# Patient Record
Sex: Male | Born: 1935 | Race: White | Hispanic: No | Marital: Married | Smoking: Light tobacco smoker
Health system: Southern US, Community
[De-identification: ages and names within clinical notes are randomized; demographics above are authoritative.]

## PROBLEM LIST (undated history)

## (undated) DIAGNOSIS — K219 Gastro-esophageal reflux disease without esophagitis: Secondary | ICD-10-CM

## (undated) DIAGNOSIS — C4490 Unspecified malignant neoplasm of skin, unspecified: Secondary | ICD-10-CM

## (undated) DIAGNOSIS — N400 Enlarged prostate without lower urinary tract symptoms: Secondary | ICD-10-CM

## (undated) DIAGNOSIS — I10 Essential (primary) hypertension: Secondary | ICD-10-CM

## (undated) DIAGNOSIS — Z860100 Personal history of colon polyps, unspecified: Secondary | ICD-10-CM

## (undated) DIAGNOSIS — R319 Hematuria, unspecified: Secondary | ICD-10-CM

## (undated) DIAGNOSIS — Z87442 Personal history of urinary calculi: Secondary | ICD-10-CM

## (undated) DIAGNOSIS — R339 Retention of urine, unspecified: Secondary | ICD-10-CM

## (undated) DIAGNOSIS — I700 Atherosclerosis of aorta: Secondary | ICD-10-CM

## (undated) DIAGNOSIS — I6529 Occlusion and stenosis of unspecified carotid artery: Secondary | ICD-10-CM

## (undated) DIAGNOSIS — K117 Disturbances of salivary secretion: Secondary | ICD-10-CM

## (undated) DIAGNOSIS — H6690 Otitis media, unspecified, unspecified ear: Secondary | ICD-10-CM

## (undated) DIAGNOSIS — F419 Anxiety disorder, unspecified: Secondary | ICD-10-CM

## (undated) DIAGNOSIS — E785 Hyperlipidemia, unspecified: Secondary | ICD-10-CM

## (undated) HISTORY — DX: Benign prostatic hyperplasia without lower urinary tract symptoms: N40.0

## (undated) HISTORY — DX: Essential (primary) hypertension: I10

## (undated) HISTORY — DX: Hyperlipidemia, unspecified: E78.5

## (undated) HISTORY — PX: TONSILLECTOMY: SUR1361

## (undated) HISTORY — DX: Occlusion and stenosis of unspecified carotid artery: I65.29

## (undated) HISTORY — PX: COLONOSCOPY: SHX174

## (undated) HISTORY — DX: Personal history of colon polyps, unspecified: Z86.0100

## (undated) HISTORY — DX: Personal history of colonic polyps: Z86.010

## (undated) HISTORY — DX: Otitis media, unspecified, unspecified ear: H66.90

## (undated) HISTORY — PX: TRANSURETHRAL RESECTION OF PROSTATE: SHX73

---

## 2000-05-07 ENCOUNTER — Ambulatory Visit (HOSPITAL_BASED_OUTPATIENT_CLINIC_OR_DEPARTMENT_OTHER): Admission: RE | Admit: 2000-05-07 | Discharge: 2000-05-07 | Payer: Self-pay | Admitting: Surgery

## 2000-05-07 ENCOUNTER — Encounter (INDEPENDENT_AMBULATORY_CARE_PROVIDER_SITE_OTHER): Payer: Self-pay | Admitting: *Deleted

## 2002-05-06 ENCOUNTER — Emergency Department (HOSPITAL_COMMUNITY): Admission: EM | Admit: 2002-05-06 | Discharge: 2002-05-07 | Payer: Self-pay | Admitting: Emergency Medicine

## 2002-05-07 ENCOUNTER — Encounter: Payer: Self-pay | Admitting: Emergency Medicine

## 2004-02-17 ENCOUNTER — Ambulatory Visit: Payer: Self-pay | Admitting: Family Medicine

## 2004-06-02 ENCOUNTER — Ambulatory Visit: Payer: Self-pay | Admitting: Family Medicine

## 2004-07-06 ENCOUNTER — Ambulatory Visit: Payer: Self-pay | Admitting: Gastroenterology

## 2004-07-12 ENCOUNTER — Ambulatory Visit: Payer: Self-pay | Admitting: Family Medicine

## 2004-07-26 ENCOUNTER — Ambulatory Visit: Payer: Self-pay | Admitting: Family Medicine

## 2004-08-04 ENCOUNTER — Ambulatory Visit: Payer: Self-pay | Admitting: Gastroenterology

## 2004-08-25 ENCOUNTER — Ambulatory Visit: Payer: Self-pay | Admitting: Gastroenterology

## 2004-12-02 ENCOUNTER — Ambulatory Visit: Payer: Self-pay | Admitting: Internal Medicine

## 2005-07-03 ENCOUNTER — Ambulatory Visit: Payer: Self-pay | Admitting: Family Medicine

## 2005-07-18 ENCOUNTER — Ambulatory Visit: Payer: Self-pay | Admitting: Family Medicine

## 2005-11-16 ENCOUNTER — Ambulatory Visit: Payer: Self-pay | Admitting: Family Medicine

## 2005-12-20 ENCOUNTER — Ambulatory Visit: Payer: Self-pay | Admitting: Family Medicine

## 2006-04-24 ENCOUNTER — Ambulatory Visit: Payer: Self-pay | Admitting: Vascular Surgery

## 2006-07-27 DIAGNOSIS — I10 Essential (primary) hypertension: Secondary | ICD-10-CM | POA: Insufficient documentation

## 2006-07-27 DIAGNOSIS — R319 Hematuria, unspecified: Secondary | ICD-10-CM | POA: Insufficient documentation

## 2006-07-27 DIAGNOSIS — Z860100 Personal history of colon polyps, unspecified: Secondary | ICD-10-CM | POA: Insufficient documentation

## 2006-07-27 DIAGNOSIS — M810 Age-related osteoporosis without current pathological fracture: Secondary | ICD-10-CM | POA: Insufficient documentation

## 2006-07-27 DIAGNOSIS — I6529 Occlusion and stenosis of unspecified carotid artery: Secondary | ICD-10-CM | POA: Insufficient documentation

## 2006-08-01 ENCOUNTER — Ambulatory Visit: Payer: Self-pay | Admitting: Family Medicine

## 2006-08-01 DIAGNOSIS — F528 Other sexual dysfunction not due to a substance or known physiological condition: Secondary | ICD-10-CM

## 2006-08-01 DIAGNOSIS — N401 Enlarged prostate with lower urinary tract symptoms: Secondary | ICD-10-CM

## 2006-08-01 DIAGNOSIS — N138 Other obstructive and reflux uropathy: Secondary | ICD-10-CM

## 2006-08-01 DIAGNOSIS — E785 Hyperlipidemia, unspecified: Secondary | ICD-10-CM

## 2006-08-08 ENCOUNTER — Ambulatory Visit: Payer: Self-pay | Admitting: Family Medicine

## 2006-08-10 ENCOUNTER — Ambulatory Visit: Payer: Self-pay | Admitting: Internal Medicine

## 2006-08-10 ENCOUNTER — Encounter: Payer: Self-pay | Admitting: Family Medicine

## 2006-09-27 ENCOUNTER — Ambulatory Visit: Payer: Self-pay | Admitting: Family Medicine

## 2006-09-28 ENCOUNTER — Encounter: Payer: Self-pay | Admitting: Family Medicine

## 2006-10-02 DIAGNOSIS — E291 Testicular hypofunction: Secondary | ICD-10-CM

## 2006-10-02 LAB — CONVERTED CEMR LAB: Testosterone: 268.52 ng/dL — ABNORMAL LOW (ref 350.00–890)

## 2006-10-03 ENCOUNTER — Telehealth (INDEPENDENT_AMBULATORY_CARE_PROVIDER_SITE_OTHER): Payer: Self-pay | Admitting: *Deleted

## 2006-10-23 ENCOUNTER — Encounter: Payer: Self-pay | Admitting: Family Medicine

## 2006-11-16 ENCOUNTER — Encounter: Payer: Self-pay | Admitting: Family Medicine

## 2006-12-25 ENCOUNTER — Ambulatory Visit: Payer: Self-pay | Admitting: Family Medicine

## 2007-01-01 ENCOUNTER — Telehealth (INDEPENDENT_AMBULATORY_CARE_PROVIDER_SITE_OTHER): Payer: Self-pay | Admitting: *Deleted

## 2007-01-02 ENCOUNTER — Ambulatory Visit: Payer: Self-pay | Admitting: Family Medicine

## 2007-01-02 DIAGNOSIS — S83419A Sprain of medial collateral ligament of unspecified knee, initial encounter: Secondary | ICD-10-CM | POA: Insufficient documentation

## 2007-05-09 ENCOUNTER — Encounter: Payer: Self-pay | Admitting: Family Medicine

## 2007-05-16 ENCOUNTER — Telehealth (INDEPENDENT_AMBULATORY_CARE_PROVIDER_SITE_OTHER): Payer: Self-pay | Admitting: *Deleted

## 2007-05-16 ENCOUNTER — Ambulatory Visit: Payer: Self-pay | Admitting: Family Medicine

## 2007-05-16 LAB — CONVERTED CEMR LAB
ALT: 20 units/L (ref 0–53)
AST: 23 units/L (ref 0–37)
Albumin: 3.6 g/dL (ref 3.5–5.2)
Alkaline Phosphatase: 80 units/L (ref 39–117)
Bilirubin, Direct: 0.1 mg/dL (ref 0.0–0.3)
Total Bilirubin: 0.7 mg/dL (ref 0.3–1.2)
Total Protein: 6.9 g/dL (ref 6.0–8.3)

## 2007-05-17 ENCOUNTER — Encounter (INDEPENDENT_AMBULATORY_CARE_PROVIDER_SITE_OTHER): Payer: Self-pay | Admitting: *Deleted

## 2007-05-20 ENCOUNTER — Encounter: Payer: Self-pay | Admitting: Family Medicine

## 2007-05-27 ENCOUNTER — Telehealth (INDEPENDENT_AMBULATORY_CARE_PROVIDER_SITE_OTHER): Payer: Self-pay | Admitting: *Deleted

## 2007-07-09 ENCOUNTER — Ambulatory Visit: Payer: Self-pay | Admitting: Vascular Surgery

## 2007-07-30 ENCOUNTER — Telehealth (INDEPENDENT_AMBULATORY_CARE_PROVIDER_SITE_OTHER): Payer: Self-pay | Admitting: *Deleted

## 2007-07-30 ENCOUNTER — Encounter (INDEPENDENT_AMBULATORY_CARE_PROVIDER_SITE_OTHER): Payer: Self-pay | Admitting: *Deleted

## 2007-08-21 ENCOUNTER — Telehealth (INDEPENDENT_AMBULATORY_CARE_PROVIDER_SITE_OTHER): Payer: Self-pay | Admitting: *Deleted

## 2007-08-27 ENCOUNTER — Ambulatory Visit: Payer: Self-pay | Admitting: Family Medicine

## 2007-08-28 ENCOUNTER — Encounter (INDEPENDENT_AMBULATORY_CARE_PROVIDER_SITE_OTHER): Payer: Self-pay | Admitting: *Deleted

## 2007-09-05 ENCOUNTER — Encounter (INDEPENDENT_AMBULATORY_CARE_PROVIDER_SITE_OTHER): Payer: Self-pay | Admitting: *Deleted

## 2007-09-17 ENCOUNTER — Telehealth: Payer: Self-pay | Admitting: Family Medicine

## 2007-09-17 ENCOUNTER — Ambulatory Visit: Payer: Self-pay | Admitting: Family Medicine

## 2008-01-20 ENCOUNTER — Ambulatory Visit: Payer: Self-pay | Admitting: Vascular Surgery

## 2008-03-17 ENCOUNTER — Ambulatory Visit: Payer: Self-pay | Admitting: Family Medicine

## 2008-03-18 ENCOUNTER — Encounter: Payer: Self-pay | Admitting: Family Medicine

## 2008-03-23 ENCOUNTER — Encounter (INDEPENDENT_AMBULATORY_CARE_PROVIDER_SITE_OTHER): Payer: Self-pay | Admitting: *Deleted

## 2008-03-31 ENCOUNTER — Encounter (INDEPENDENT_AMBULATORY_CARE_PROVIDER_SITE_OTHER): Payer: Self-pay | Admitting: *Deleted

## 2008-04-23 ENCOUNTER — Ambulatory Visit: Payer: Self-pay | Admitting: Family Medicine

## 2008-05-08 ENCOUNTER — Ambulatory Visit: Payer: Self-pay | Admitting: Family Medicine

## 2008-05-08 ENCOUNTER — Ambulatory Visit (HOSPITAL_BASED_OUTPATIENT_CLINIC_OR_DEPARTMENT_OTHER): Admission: RE | Admit: 2008-05-08 | Discharge: 2008-05-08 | Payer: Self-pay | Admitting: Family Medicine

## 2008-05-08 ENCOUNTER — Ambulatory Visit: Payer: Self-pay | Admitting: Diagnostic Radiology

## 2008-05-08 DIAGNOSIS — M549 Dorsalgia, unspecified: Secondary | ICD-10-CM | POA: Insufficient documentation

## 2008-05-10 DIAGNOSIS — M949 Disorder of cartilage, unspecified: Secondary | ICD-10-CM

## 2008-05-10 DIAGNOSIS — M899 Disorder of bone, unspecified: Secondary | ICD-10-CM | POA: Insufficient documentation

## 2008-05-11 ENCOUNTER — Ambulatory Visit: Payer: Self-pay | Admitting: Family Medicine

## 2008-05-11 ENCOUNTER — Telehealth (INDEPENDENT_AMBULATORY_CARE_PROVIDER_SITE_OTHER): Payer: Self-pay | Admitting: *Deleted

## 2008-05-15 ENCOUNTER — Encounter (INDEPENDENT_AMBULATORY_CARE_PROVIDER_SITE_OTHER): Payer: Self-pay | Admitting: *Deleted

## 2008-05-15 LAB — CONVERTED CEMR LAB: Vit D, 25-Hydroxy: 38 ng/mL (ref 30–89)

## 2008-07-21 ENCOUNTER — Ambulatory Visit: Payer: Self-pay | Admitting: Vascular Surgery

## 2008-08-26 ENCOUNTER — Encounter: Payer: Self-pay | Admitting: Family Medicine

## 2008-08-26 ENCOUNTER — Ambulatory Visit: Payer: Self-pay | Admitting: Internal Medicine

## 2008-09-14 ENCOUNTER — Encounter (INDEPENDENT_AMBULATORY_CARE_PROVIDER_SITE_OTHER): Payer: Self-pay | Admitting: *Deleted

## 2008-09-23 ENCOUNTER — Ambulatory Visit: Payer: Self-pay | Admitting: Family Medicine

## 2008-09-23 ENCOUNTER — Encounter (INDEPENDENT_AMBULATORY_CARE_PROVIDER_SITE_OTHER): Payer: Self-pay | Admitting: *Deleted

## 2008-09-23 DIAGNOSIS — F17200 Nicotine dependence, unspecified, uncomplicated: Secondary | ICD-10-CM | POA: Insufficient documentation

## 2008-09-25 ENCOUNTER — Encounter (INDEPENDENT_AMBULATORY_CARE_PROVIDER_SITE_OTHER): Payer: Self-pay | Admitting: *Deleted

## 2008-10-05 ENCOUNTER — Ambulatory Visit: Payer: Self-pay | Admitting: Internal Medicine

## 2008-10-08 ENCOUNTER — Encounter: Payer: Self-pay | Admitting: Family Medicine

## 2008-10-15 ENCOUNTER — Telehealth (INDEPENDENT_AMBULATORY_CARE_PROVIDER_SITE_OTHER): Payer: Self-pay | Admitting: *Deleted

## 2008-10-28 ENCOUNTER — Telehealth (INDEPENDENT_AMBULATORY_CARE_PROVIDER_SITE_OTHER): Payer: Self-pay | Admitting: *Deleted

## 2008-12-14 ENCOUNTER — Telehealth (INDEPENDENT_AMBULATORY_CARE_PROVIDER_SITE_OTHER): Payer: Self-pay | Admitting: *Deleted

## 2008-12-15 ENCOUNTER — Ambulatory Visit: Payer: Self-pay | Admitting: Family Medicine

## 2008-12-15 LAB — CONVERTED CEMR LAB
OCCULT 1: NEGATIVE
OCCULT 3: NEGATIVE

## 2009-01-12 ENCOUNTER — Ambulatory Visit: Payer: Self-pay | Admitting: Vascular Surgery

## 2009-02-27 ENCOUNTER — Telehealth: Payer: Self-pay | Admitting: Family Medicine

## 2009-04-26 ENCOUNTER — Telehealth (INDEPENDENT_AMBULATORY_CARE_PROVIDER_SITE_OTHER): Payer: Self-pay | Admitting: *Deleted

## 2009-04-26 ENCOUNTER — Ambulatory Visit: Payer: Self-pay | Admitting: Family Medicine

## 2009-04-28 LAB — CONVERTED CEMR LAB
Bilirubin, Direct: 0 mg/dL (ref 0.0–0.3)
Cholesterol: 155 mg/dL (ref 0–200)
LDL Cholesterol: 65 mg/dL (ref 0–99)
Total Bilirubin: 0.4 mg/dL (ref 0.3–1.2)
Total CHOL/HDL Ratio: 2
Triglycerides: 84 mg/dL (ref 0.0–149.0)
VLDL: 16.8 mg/dL (ref 0.0–40.0)

## 2009-06-07 ENCOUNTER — Encounter: Payer: Self-pay | Admitting: Family Medicine

## 2009-07-27 ENCOUNTER — Ambulatory Visit: Payer: Self-pay | Admitting: Vascular Surgery

## 2009-07-27 ENCOUNTER — Encounter: Payer: Self-pay | Admitting: Family Medicine

## 2009-09-24 ENCOUNTER — Encounter (INDEPENDENT_AMBULATORY_CARE_PROVIDER_SITE_OTHER): Payer: Self-pay | Admitting: *Deleted

## 2009-09-24 ENCOUNTER — Telehealth (INDEPENDENT_AMBULATORY_CARE_PROVIDER_SITE_OTHER): Payer: Self-pay | Admitting: *Deleted

## 2009-09-24 ENCOUNTER — Ambulatory Visit: Payer: Self-pay | Admitting: Family Medicine

## 2009-09-28 ENCOUNTER — Ambulatory Visit: Payer: Self-pay | Admitting: Family Medicine

## 2009-09-28 ENCOUNTER — Telehealth: Payer: Self-pay | Admitting: Family Medicine

## 2009-09-28 DIAGNOSIS — N390 Urinary tract infection, site not specified: Secondary | ICD-10-CM | POA: Insufficient documentation

## 2009-09-28 LAB — CONVERTED CEMR LAB
AST: 22 units/L (ref 0–37)
BUN: 9 mg/dL (ref 6–23)
Basophils Absolute: 0 10*3/uL (ref 0.0–0.1)
Basophils Relative: 0.5 % (ref 0.0–3.0)
Bilirubin, Direct: 0.1 mg/dL (ref 0.0–0.3)
Cholesterol: 137 mg/dL (ref 0–200)
Eosinophils Absolute: 0.2 10*3/uL (ref 0.0–0.7)
GFR calc non Af Amer: 92.28 mL/min (ref >60)
LDL Cholesterol: 70 mg/dL (ref 0–99)
MCHC: 33.7 g/dL (ref 30.0–36.0)
MCV: 99.8 fL (ref 78.0–100.0)
Monocytes Absolute: 0.6 10*3/uL (ref 0.1–1.0)
Neutrophils Relative %: 67.6 % (ref 43.0–77.0)
Nitrite: NEGATIVE
Platelets: 225 10*3/uL (ref 150.0–400.0)
Potassium: 4.5 meq/L (ref 3.5–5.1)
RDW: 14.6 % (ref 11.5–14.6)
Sodium: 142 meq/L (ref 135–145)
Specific Gravity, Urine: <1.005
Total Bilirubin: 0.6 mg/dL (ref 0.3–1.2)
Triglycerides: 66 mg/dL (ref 0.0–149.0)
Urobilinogen, UA: 0.2
VLDL: 13.2 mg/dL (ref 0.0–40.0)

## 2009-09-29 LAB — CONVERTED CEMR LAB: Fecal Occult Bld: NEGATIVE

## 2009-09-30 ENCOUNTER — Ambulatory Visit: Payer: Self-pay | Admitting: Family Medicine

## 2009-09-30 LAB — CONVERTED CEMR LAB
Testosterone-% Free: 1.5 % — ABNORMAL LOW (ref 1.6–2.9)
Vit D, 25-Hydroxy: 41 ng/mL (ref 30–89)

## 2009-10-11 ENCOUNTER — Ambulatory Visit (HOSPITAL_BASED_OUTPATIENT_CLINIC_OR_DEPARTMENT_OTHER): Admission: RE | Admit: 2009-10-11 | Discharge: 2009-10-11 | Payer: Self-pay | Admitting: Family Medicine

## 2009-10-11 ENCOUNTER — Ambulatory Visit: Payer: Self-pay | Admitting: Family Medicine

## 2009-10-11 ENCOUNTER — Ambulatory Visit: Payer: Self-pay | Admitting: Diagnostic Radiology

## 2009-10-11 DIAGNOSIS — R079 Chest pain, unspecified: Secondary | ICD-10-CM | POA: Insufficient documentation

## 2009-11-04 ENCOUNTER — Encounter: Payer: Self-pay | Admitting: Family Medicine

## 2009-11-16 ENCOUNTER — Encounter (INDEPENDENT_AMBULATORY_CARE_PROVIDER_SITE_OTHER): Payer: Self-pay | Admitting: *Deleted

## 2010-03-27 LAB — CONVERTED CEMR LAB
ALT: 18 units/L (ref 0–40)
ALT: 20 units/L (ref 0–53)
AST: 25 units/L (ref 0–37)
AST: 27 units/L (ref 0–37)
AST: 29 units/L (ref 0–37)
Albumin: 3.5 g/dL (ref 3.5–5.2)
Albumin: 3.8 g/dL (ref 3.5–5.2)
Alkaline Phosphatase: 64 units/L (ref 39–117)
Alkaline Phosphatase: 68 units/L (ref 39–117)
Alkaline Phosphatase: 74 units/L (ref 39–117)
Alkaline Phosphatase: 77 units/L (ref 39–117)
BUN: 9 mg/dL (ref 6–23)
BUN: 9 mg/dL (ref 6–23)
Basophils Absolute: 0 10*3/uL (ref 0.0–0.1)
Basophils Absolute: 0 10*3/uL (ref 0.0–0.1)
Basophils Relative: 0.7 % (ref 0.0–1.0)
Bilirubin, Direct: 0.1 mg/dL (ref 0.0–0.3)
Bilirubin, Direct: 0.1 mg/dL (ref 0.0–0.3)
Bilirubin, Direct: 0.1 mg/dL (ref 0.0–0.3)
Blood in Urine, dipstick: NEGATIVE
CO2: 29 meq/L (ref 19–32)
CO2: 31 meq/L (ref 19–32)
Calcium: 9 mg/dL (ref 8.4–10.5)
Calcium: 9.3 mg/dL (ref 8.4–10.5)
Chloride: 105 meq/L (ref 96–112)
Chloride: 108 meq/L (ref 96–112)
Creatinine, Ser: 0.8 mg/dL (ref 0.4–1.5)
Creatinine, Ser: 0.8 mg/dL (ref 0.4–1.5)
Eosinophils Absolute: 0.1 10*3/uL (ref 0.0–0.6)
Eosinophils Absolute: 0.1 10*3/uL (ref 0.0–0.7)
Eosinophils Relative: 1.6 % (ref 0.0–5.0)
GFR calc Af Amer: 122 mL/min
GFR calc Af Amer: 123 mL/min
GFR calc non Af Amer: 101 mL/min
GFR calc non Af Amer: 101 mL/min
Glucose, Bld: 80 mg/dL (ref 70–99)
Glucose, Bld: 88 mg/dL (ref 70–99)
Glucose, Bld: 97 mg/dL (ref 70–99)
Glucose, Urine, Semiquant: NEGATIVE
HCT: 40.2 % (ref 39.0–52.0)
HCT: 40.4 % (ref 39.0–52.0)
HDL: 61.8 mg/dL (ref >39.00)
Hemoglobin: 13.8 g/dL (ref 13.0–17.0)
Hemoglobin: 13.9 g/dL (ref 13.0–17.0)
Ketones, urine, test strip: NEGATIVE
Lymphocytes Relative: 22.2 % (ref 12.0–46.0)
Lymphocytes Relative: 24.4 % (ref 12.0–46.0)
MCHC: 34.1 g/dL (ref 30.0–36.0)
MCHC: 34.5 g/dL (ref 30.0–36.0)
MCV: 98.7 fL (ref 78.0–100.0)
Monocytes Absolute: 0.5 10*3/uL (ref 0.1–1.0)
Monocytes Absolute: 0.6 10*3/uL (ref 0.2–0.7)
Monocytes Relative: 7 % (ref 3.0–12.0)
Monocytes Relative: 8.2 % (ref 3.0–11.0)
Neutro Abs: 4.5 10*3/uL (ref 1.4–7.7)
Neutro Abs: 4.7 10*3/uL (ref 1.4–7.7)
Neutrophils Relative %: 64.8 % (ref 43.0–77.0)
Neutrophils Relative %: 66.7 % (ref 43.0–77.0)
Neutrophils Relative %: 67.3 % (ref 43.0–77.0)
PSA: 2.47 ng/mL (ref 0.10–4.00)
PSA: 2.52 ng/mL (ref 0.10–4.00)
Platelets: 237 10*3/uL (ref 150–400)
Platelets: 243 10*3/uL (ref 150–400)
Potassium: 4 meq/L (ref 3.5–5.1)
Potassium: 4.1 meq/L (ref 3.5–5.1)
RBC: 4.09 M/uL — ABNORMAL LOW (ref 4.22–5.81)
RDW: 12.9 % (ref 11.5–14.6)
RDW: 13.4 % (ref 11.5–14.6)
RDW: 13.7 % (ref 11.5–14.6)
Sodium: 141 meq/L (ref 135–145)
Sodium: 142 meq/L (ref 135–145)
Specific Gravity, Urine: <1.005
TSH: 0.39 microintl units/mL (ref 0.35–5.50)
Testosterone-% Free: 1.7 % (ref 1.6–2.9)
Testosterone: 390.75 ng/dL (ref 350–890)
Total Bilirubin: 0.7 mg/dL (ref 0.3–1.2)
Total Bilirubin: 0.8 mg/dL (ref 0.3–1.2)
Total Protein: 6.3 g/dL (ref 6.0–8.3)
Total Protein: 6.4 g/dL (ref 6.0–8.3)
Total Protein: 6.7 g/dL (ref 6.0–8.3)
Triglycerides: 71 mg/dL (ref 0.0–149.0)
WBC: 7 10*3/uL (ref 4.5–10.5)
pH: 7.5

## 2010-03-31 NOTE — Progress Notes (Signed)
Summary: LABS  Phone Note Call from Patient Call back at [facility] Phone [phone]   Caller: Patient Summary of Call: PT WANT TO KNOW WILL HE BE COMING IN EVERY 6 MONTH FOR LABS.HE HAD LABS TODAY. Initial call taken by: Freddy Jaksch,  September 28, 2009 8:39 AM  Follow-up for Phone Call        ok Follow-up by: Loreen Freud DO,  September 28, 2009 11:40 AM

## 2010-03-31 NOTE — Letter (Signed)
Summary: [facility]  [facility]   Imported By: Lanelle Bal 08/14/2009 11:14:49  _____________________________________________________________________  External Attachment:    Type:   Image     Comment:   External Document

## 2010-03-31 NOTE — Consult Note (Signed)
Summary: [facility]  [facility]   Imported By: Lanelle Bal 11/16/2009 09:35:21  _____________________________________________________________________  External Attachment:    Type:   Image     Comment:   External Document

## 2010-03-31 NOTE — Miscellaneous (Signed)
Summary: Orders Update  Clinical Lists Changes  Medications: Rx of PLAVIX 75 MG TABS (CLOPIDOGREL BISULFATE) qd;  #90 x 3;  Signed;  Entered by: Loreen Freud DO;  Authorized by: Loreen Freud DO;  Method used: Electronically to [facility]*, , ,   , Ph: [phone], Fax: [phone] Rx of FOSAMAX 70 MG TABS (ALENDRONATE SODIUM) 1 by mouth q week;  #12 x 3;  Signed;  Entered by: Loreen Freud DO;  Authorized by: Loreen Freud DO;  Method used: Electronically to [facility]*, , ,   , Ph: [phone], Fax: [phone]    Prescriptions: FOSAMAX 70 MG TABS (ALENDRONATE SODIUM) 1 by mouth q week  #12 x 3   Entered and Authorized by:   Loreen Freud DO   Signed by:   Loreen Freud DO on 06/07/2009   Method used:   Electronically to        [facility]* (mail-order)             ,          Ph: [phone]       Fax: [phone]   RxID:   [id number] PLAVIX 75 MG TABS (CLOPIDOGREL BISULFATE) qd  #90 x 3   Entered and Authorized by:   Loreen Freud DO   Signed by:   Loreen Freud DO on 06/07/2009   Method used:   Electronically to        [facility]* (mail-order)             ,          Ph: [phone]       Fax: [phone]   RxID:   [id number]

## 2010-03-31 NOTE — Progress Notes (Signed)
----   Converted from flag ---- ---- 09/24/2009 4:52 PM, Okey Regal Spring wrote: added to lab [facility] [id number]  ---- 09/24/2009 9:18 AM, Loreen Freud DO wrote:   Please add free testosterone to lab appointment  257.2 ------------------------------

## 2010-03-31 NOTE — Assessment & Plan Note (Signed)
Summary: results of lab work//lch   Vital Signs:  Patient profile:   77 year old male Height:      72.5 inches Weight:      133 pounds Temp:     98.6 degrees F oral Pulse rate:   66 / minute BP sitting:   118 / 58  (left arm)  Vitals Entered By: Jeremy Johann CMA (September 30, 2009 2:53 PM) CC: discuss lab results   History of Present Illness: Pt here to discuss labs only.  Current Medications (verified): 1)  Viagra 100 Mg Tabs (Sildenafil Citrate) .Marland Kitchen.. 1 As Directed 2)  Plavix 75 Mg Tabs (Clopidogrel Bisulfate) .... Qd 3)  Vytorin 10-40 Mg  Tabs (Ezetimibe-Simvastatin) .... Take One Tablet Daily 4)  Fosamax 70 Mg Tabs (Alendronate Sodium) .Marland Kitchen.. 1 By Mouth Q Week 5)  Saw Palmetto 450 Mg Caps (Saw Palmetto (Serenoa Repens)) .... Take 1 Tab Three Times A Day 6)  Caltrate 600+d Plus 600-400 Mg-Unit Tabs (Calcium Carbonate-Vit D-Min) .Marland Kitchen.. 1 By Mouth Two Times A Day 7)  Multivitamins  Tabs (Multiple Vitamin) .Marland Kitchen.. 1 By Mouth Once Daily 8)  Vitamin D3 1000 Unit Tabs (Cholecalciferol) .Marland Kitchen.. 1 By Mouth Once Daily  Allergies (verified): 1)  ! Pcn 2)  ! Aspirin  Past History:  Past Medical History: Last updated: 09/24/2009 Colonic polyps, hx of Hypertension Osteoporosis Kidney Stone Chronic L ear infection--- followed by ENT  Past Surgical History: Last updated: 09/24/2009 Denies surgical history  Family History: Last updated: 07/27/2006 Family History Stroke  Social History: Last updated: 08/01/2006 Retired, [employer] Married Current Smoker Alcohol use-yes Drug use-no Regular exercise-yes  Risk Factors: Alcohol Use: 2 (09/24/2009) >5 drinks/d w/in last 3 months: no (09/24/2009) Caffeine Use: 0 (09/24/2009) Exercise: yes (09/24/2009)  Risk Factors: Smoking Status: current (09/24/2009) Packs/Day: 0.5 (09/24/2009)  Family History: Reviewed history from 07/27/2006 and no changes required. Family History Stroke  Social History: Reviewed history from 08/01/2006 and  no changes required. Retired, [employer] Married Current Smoker Alcohol use-yes Drug use-no Regular exercise-yes  Review of Systems      See HPI  Physical Exam  General:  Well-developed,well-nourished,in no acute distress; alert,appropriate and cooperative throughout examination Psych:  Cognition and judgment appear intact. Alert and cooperative with normal attention span and concentration. No apparent delusions, illusions, hallucinations   Impression & Recommendations:  Problem # 1:  TESTOSTERONE DEFICIENCY (ICD-257.2)  Problem # 2:  BENIGN PROSTATIC HYPERTROPHY, HX OF (ICD-V13.8) PSA increased from last year urology pending  Complete Medication List: 1)  Viagra 100 Mg Tabs (Sildenafil citrate) .Marland Kitchen.. 1 as directed 2)  Plavix 75 Mg Tabs (Clopidogrel bisulfate) .... Qd 3)  Vytorin 10-40 Mg Tabs (Ezetimibe-simvastatin) .... Take one tablet daily 4)  Fosamax 70 Mg Tabs (Alendronate sodium) .Marland Kitchen.. 1 by mouth q week 5)  Saw Palmetto 450 Mg Caps (Saw palmetto (serenoa repens)) .... Take 1 tab three times a day 6)  Caltrate 600+d Plus 600-400 Mg-unit Tabs (Calcium carbonate-vit d-min) .Marland Kitchen.. 1 by mouth two times a day 7)  Multivitamins Tabs (Multiple vitamin) .Marland Kitchen.. 1 by mouth once daily 8)  Vitamin D3 1000 Unit Tabs (Cholecalciferol) .Marland Kitchen.. 1 by mouth once daily

## 2010-03-31 NOTE — Assessment & Plan Note (Signed)
Summary: cpx/ns/kdc   Vital Signs:  Patient profile:   77 year old male Height:      72.5 inches Weight:      132 pounds BMI:     17.72 Temp:     98.4 degrees F oral Pulse rate:   72 / minute Resp:     20 per minute BP sitting:   118 / 68  (left arm)  Vitals Entered By: Jeremy Johann CMA (September 24, 2009 8:24 AM) CC: cpx, not fasting Is Patient Diabetic? No Nutritional Status BMI of < 19 = underweight Domestic Violence Intervention NA  Does patient need assistance? Functional Status Self care, Cook/clean, Shopping, Social activities Ambulation Normal Comments Pt is able to do all ADLs.  Pt is able to read and right.    Vision Screening:      Vision Comments: pt sees optho--corrected with glasses 40db HL: Left  Right  Audiometry Comment: Pt followed by ENT--- able to hear whisper at 6 feet    History of Present Illness: Pt here for cpe  and labs.  No complaints.    Preventive Screening-Counseling & Management  Alcohol-Tobacco     Alcohol drinks/day: 2     Alcohol type: spirits     >5/day in last 3 mos: no     Feels need to cut down: no     Feels guilty re: drinking: no     Needs 'eye opener' in am: no     Smoking Status: current     Smoking Cessation Counseling: yes     Smoke Cessation Stage: contemplative     Packs/Day: 0.5     Year Started: 1954  Caffeine-Diet-Exercise     Caffeine use/day: 0     Does Patient Exercise: yes     Type of exercise: walking , hand weights     Exercise (avg: min/session): 30-60     Times/week: 6     Exercise Counseling: not indicated; exercise is adequate  Hep-HIV-STD-Contraception     Dental Visit-last 6 months yes     Dental Care Counseling: not indicated; dental care within six months     Sun Exposure-Excessive: no     Sun Exposure Counseling: not indicated; sun exposure is acceptable  Safety-Violence-Falls     Seat Belt Use: yes     Smoke Detectors: yes     Violence in the Home: no risk noted     Sexual Abuse:  no     Fall Risk: no      Sexual History:  currently monogamous.    Current Medications (verified): 1)  Viagra 100 Mg Tabs (Sildenafil Citrate) .Marland Kitchen.. 1 As Directed 2)  Plavix 75 Mg Tabs (Clopidogrel Bisulfate) .... Qd 3)  Vytorin 10-40 Mg  Tabs (Ezetimibe-Simvastatin) .... Take One Tablet Daily 4)  Fosamax 70 Mg Tabs (Alendronate Sodium) .Marland Kitchen.. 1 By Mouth Q Week 5)  Saw Palmetto 450 Mg Caps (Saw Palmetto (Serenoa Repens)) .... Take 1 Tab Two Times A Day 6)  Caltrate 600+d Plus 600-400 Mg-Unit Tabs (Calcium Carbonate-Vit D-Min) .Marland Kitchen.. 1 By Mouth Two Times A Day 7)  Multivitamins  Tabs (Multiple Vitamin) .Marland Kitchen.. 1 By Mouth Once Daily 8)  Vitamin D3 1000 Unit Tabs (Cholecalciferol) .Marland Kitchen.. 1 By Mouth Once Daily  Allergies (verified): 1)  ! Pcn 2)  ! Aspirin  Past History:  Family History: Last updated: 07/27/2006 Family History Stroke  Social History: Last updated: 08/01/2006 Retired, [employer] Married Current Smoker Alcohol use-yes Drug use-no Regular exercise-yes  Risk Factors: Alcohol  Use: 2 (09/24/2009) >5 drinks/d w/in last 3 months: no (09/24/2009) Caffeine Use: 0 (09/24/2009) Exercise: yes (09/24/2009)  Risk Factors: Smoking Status: current (09/24/2009) Packs/Day: 0.5 (09/24/2009)  Past Medical History: Colonic polyps, hx of Hypertension Osteoporosis Kidney Stone Chronic L ear infection--- followed by ENT  Past Surgical History: Denies surgical history  Family History: Reviewed history from 07/27/2006 and no changes required. Family History Stroke  Social History: Reviewed history from 08/01/2006 and no changes required. Retired, [employer] Married Current Smoker Alcohol use-yes Drug use-no Regular exercise-yes Caffeine use/day:  0 Fall Risk:  no  Review of Systems      See HPI General:  Denies chills, fatigue, fever, loss of appetite, malaise, sleep disorder, sweats, weakness, and weight loss. Eyes:  Denies blurring, discharge, double vision, eye irritation, eye  pain, halos, itching, light sensitivity, red eye, vision loss-1 eye, and vision loss-both eyes; optho q1y. ENT:  Denies decreased hearing, difficulty swallowing, ear discharge, earache, hoarseness, nasal congestion, nosebleeds, postnasal drainage, ringing in ears, sinus pressure, and sore throat; ENT--followed annually for cerumen and hole in eardrum . CV:  Denies bluish discoloration of lips or nails, chest pain or discomfort, difficulty breathing at night, difficulty breathing while lying down, fainting, fatigue, leg cramps with exertion, lightheadness, near fainting, palpitations, shortness of breath with exertion, swelling of feet, swelling of hands, and weight gain. Resp:  Denies chest discomfort, chest pain with inspiration, cough, coughing up blood, excessive snoring, hypersomnolence, morning headaches, pleuritic, shortness of breath, sputum productive, and wheezing. GI:  Denies abdominal pain, bloody stools, change in bowel habits, constipation, dark tarry stools, diarrhea, excessive appetite, gas, hemorrhoids, indigestion, loss of appetite, and nausea. GU:  Denies decreased libido, discharge, dysuria, genital sores, hematuria, incontinence, nocturia, urinary frequency, and urinary hesitancy. MS:  Denies joint pain, joint redness, joint swelling, loss of strength, low back pain, mid back pain, muscle aches, muscle , cramps, muscle weakness, stiffness, and thoracic pain. Derm:  Denies changes in color of skin, changes in nail beds, dryness, excessive perspiration, flushing, hair loss, insect bite(s), itching, lesion(s), poor wound healing, and rash; derm-- Dr Doristine Section q61m . Neuro:  Denies brief paralysis, difficulty with concentration, disturbances in coordination, falling down, headaches, inability to speak, memory loss, numbness, poor balance, seizures, sensation of room spinning, tingling, tremors, visual disturbances, and weakness. Psych:  Denies alternate hallucination ( auditory/visual),  anxiety, depression, easily angered, easily tearful, irritability, mental problems, panic attacks, sense of great danger, suicidal thoughts/plans, thoughts of violence, unusual visions or sounds, and thoughts /plans of harming others. Endo:  Denies cold intolerance, excessive hunger, excessive thirst, excessive urination, heat intolerance, polyuria, and weight change. Heme:  Denies abnormal bruising, bleeding, enlarge lymph nodes, fevers, pallor, and skin discoloration. Allergy:  Denies hives or rash, itching eyes, persistent infections, seasonal allergies, and sneezing.  Physical Exam  General:  Well-developed,well-nourished,in no acute distress; alert,appropriate and cooperative throughout examination Head:  Normocephalic and atraumatic without obvious abnormalities. No apparent alopecia or balding. Eyes:  pupils equal, pupils round, pupils reactive to light, and no injection.   Ears:  L ear + hole R ear normal min decreased hearing L ear Nose:  External nasal examination shows no deformity or inflammation. Nasal mucosa are pink and moist without lesions or exudates. Mouth:  Oral mucosa and oropharynx without lesions or exudates.   Neck:  No deformities, masses, or tenderness noted. + L carotid bruit Chest Wall:  No deformities, masses, tenderness or gynecomastia noted. Lungs:  Normal respiratory effort, chest expands symmetrically. Lungs are  clear to auscultation, no crackles or wheezes. Heart:  normal rate and no murmur.   Abdomen:  Bowel sounds positive,abdomen soft and non-tender without masses, organomegaly or hernias noted. Rectal:  No external abnormalities noted. Normal sphincter tone. No rectal masses or tenderness.  Heme neg brown stool Genitalia:  Testes bilaterally descended without nodularity, tenderness or masses. No scrotal masses or lesions. No penis lesions or urethral discharge. Prostate:  no nodules, no asymmetry, no induration, and 2+ enlarged.   Msk:  normal ROM, no  joint tenderness, no joint swelling, no joint warmth, no redness over joints, no joint deformities, no joint instability, and no crepitation.   Pulses:  R posterior tibial normal, R dorsalis pedis normal, R carotid normal, L posterior tibial normal, L dorsalis pedis normal, and L carotid normal.   Extremities:  No clubbing, cyanosis, edema, or deformity noted with normal full range of motion of all joints.   Neurologic:  alert & oriented X3, cranial nerves II-XII intact, strength normal in all extremities, and gait normal.   Skin:  Intact without suspicious lesions or rashes Cervical Nodes:  No lymphadenopathy noted Psych:  Cognition and judgment appear intact. Alert and cooperative with normal attention span and concentration. No apparent delusions, illusions, hallucinations   Impression & Recommendations:  Problem # 1:  PREVENTIVE HEALTH CARE (ICD-V70.0)  schedule fasting labs ghm utd Reviewed preventive care protocols, scheduled due services, and updated immunizations.  Orders: First annual wellness visit with prevention plan  (Z6109) EKG w/ Interpretation (93000)  Problem # 2:  TOBACCO USER (ICD-305.1)  Encouraged smoking cessation and discussed different methods for smoking cessation.  pt is not interested in quitting   Problem # 3:  OSTEOPOROSIS (ICD-733.00)  His updated medication list for this problem includes:    Fosamax 70 Mg Tabs (Alendronate sodium) .Marland Kitchen... 1 by mouth q week    Caltrate 600+d Plus 600-400 Mg-unit Tabs (Calcium carbonate-vit d-min) .Marland Kitchen... 1 by mouth two times a day    Vitamin D3 1000 Unit Tabs (Cholecalciferol) .Marland Kitchen... 1 by mouth once daily  Bone Density: osteoporosis (08/10/2006) Vit D:38 (05/11/2008)  Problem # 4:  TESTOSTERONE DEFICIENCY (ICD-257.2)  Problem # 5:  ERECTILE DYSFUNCTION (ICD-302.72)  His updated medication list for this problem includes:    Viagra 100 Mg Tabs (Sildenafil citrate) .Marland Kitchen... 1 as directed  Problem # 6:  BENIGN PROSTATIC  HYPERTROPHY, HX OF (ICD-V13.8)  Problem # 7:  HYPERLIPIDEMIA NEC/NOS (ICD-272.4)  His updated medication list for this problem includes:    Vytorin 10-40 Mg Tabs (Ezetimibe-simvastatin) .Marland Kitchen... Take one tablet daily  Labs Reviewed: SGOT: 24 (04/26/2009)   SGPT: 17 (04/26/2009)   HDL:73.60 (04/26/2009), 61.80 (09/23/2008)  LDL:65 (04/26/2009), 66 (09/23/2008)  Chol:155 (04/26/2009), 142 (09/23/2008)  Trig:84.0 (04/26/2009), 71.0 (09/23/2008)  Problem # 8:  CAROTID STENOSIS (ICD-433.10)  His updated medication list for this problem includes:    Plavix 75 Mg Tabs (Clopidogrel bisulfate) ..... Qd  Problem # 9:  HYPERTENSION (ICD-401.9)  BP today: 118/68 Prior BP: 110/64 (09/23/2008)  Labs Reviewed: K+: 4.0 (09/23/2008) Creat: : 0.8 (09/23/2008)   Chol: 155 (04/26/2009)   HDL: 73.60 (04/26/2009)   LDL: 65 (04/26/2009)   TG: 84.0 (04/26/2009)  Complete Medication List: 1)  Viagra 100 Mg Tabs (Sildenafil citrate) .Marland Kitchen.. 1 as directed 2)  Plavix 75 Mg Tabs (Clopidogrel bisulfate) .... Qd 3)  Vytorin 10-40 Mg Tabs (Ezetimibe-simvastatin) .... Take one tablet daily 4)  Fosamax 70 Mg Tabs (Alendronate sodium) .Marland Kitchen.. 1 by mouth q week 5)  Saw  Palmetto 450 Mg Caps (Saw palmetto (serenoa repens)) .... Take 1 tab two times a day 6)  Caltrate 600+d Plus 600-400 Mg-unit Tabs (Calcium carbonate-vit d-min) .Marland Kitchen.. 1 by mouth two times a day 7)  Multivitamins Tabs (Multiple vitamin) .Marland Kitchen.. 1 by mouth once daily 8)  Vitamin D3 1000 Unit Tabs (Cholecalciferol) .Marland Kitchen.. 1 by mouth once daily  Patient Instructions: 1)  V70.0  272.4  733.0  cbcd, bmp, hep, lipid, psa ,  UA,  vita D--- fasting  2)  Please schedule a follow-up appointment in 6 months .  Prescriptions: VIAGRA 100 MG TABS (SILDENAFIL CITRATE) 1 as directed  #10 x 5   Entered and Authorized by:   Loreen Freud DO   Signed by:   Loreen Freud DO on 09/24/2009   Method used:   Print then Give to Patient   RxID:   [id number] VYTORIN 10-40 MG  TABS  (EZETIMIBE-SIMVASTATIN) Take one tablet daily  #90 x 3   Entered and Authorized by:   Loreen Freud DO   Signed by:   Loreen Freud DO on 09/24/2009   Method used:   Electronically to        [facility]* (retail)             ,          Ph: [phone]       Fax: [phone]   RxID:   [id number] PLAVIX 75 MG TABS (CLOPIDOGREL BISULFATE) qd  #90 x 3   Entered and Authorized by:   Loreen Freud DO   Signed by:   Loreen Freud DO on 09/24/2009   Method used:   Electronically to        [facility]* (retail)             ,          Ph: [phone]       Fax: [phone]   RxID:   [id number] FOSAMAX 70 MG TABS (ALENDRONATE SODIUM) 1 by mouth q week  #12 x 3   Entered and Authorized by:   Loreen Freud DO   Signed by:   Loreen Freud DO on 09/24/2009   Method used:   Electronically to        [facility]* (retail)             ,          Ph: [phone]       Fax: [phone]   RxID:   [id number]   Prevention & Chronic Care Immunizations   Influenza vaccine: given  (12/09/2008)   Influenza vaccine due: 12/09/2009    Tetanus booster: 03/15/2005: given   Tetanus booster due: 03/16/2015    Pneumococcal vaccine: Pneumovax  (05/22/2002)   Pneumococcal vaccine due: None    H. zoster vaccine: 05/29/2006: Zostavax  Colorectal Screening   Hemoccult: Not documented    Colonoscopy: Diverticulosis  (08/25/2004)   Colonoscopy due: 08/26/2014  Other Screening   PSA: 2.52  (09/23/2008)   PSA due due: 03/17/2009   Smoking status: current  (09/24/2009)   Smoking cessation counseling: yes  (09/24/2009)  Lipids   Total Cholesterol: 155  (04/26/2009)   LDL: 65  (04/26/2009)   LDL Direct: Not documented   HDL: 73.60  (04/26/2009)   Triglycerides: 84.0  (04/26/2009)    SGOT (AST): 24  (04/26/2009)   SGPT (ALT): 17  (04/26/2009)   Alkaline phosphatase: 78  (04/26/2009)   Total bilirubin: 0.4  (04/26/2009)  Hypertension   Last  Blood Pressure: 118 / 68   (09/24/2009)   Serum creatinine: 0.8  (09/23/2008)   Serum potassium 4.0  (09/23/2008)  Self-Management Support :    Hypertension self-management support: Not documented    Lipid self-management support: Not documented     EKG  Procedure date:  09/24/2009  Findings:      Normal sinus rhythm with rate of:  66Left axis deviation.  Right bundle branch block.     Last Flu Vaccine:  Fluvax MCR (12/25/2006 3:16:55 PM) Flu Vaccine Result Date:  12/09/2008 Flu Vaccine Result:  given Flu Vaccine Next Due:  1 yr  Appended Document: Orders Update    Clinical Lists Changes  Orders: Added new Service order of Prescription Created Electronically [phone]) - Signed

## 2010-03-31 NOTE — Letter (Signed)
Summary: [name]: Immunoassay Fecal Occult Blood (iFOB) Order Form  [facility] [place]  [address] [place], [place] [address]   Phone: [phone]  Fax: [phone]      [facility]: Immunoassay Fecal Occult Blood (iFOB) Order Form   September 24, 2009 MRN: [id number]   Rick [name] [birth year]   Physicican Name:_____dr lowne____________________  Diagnosis Code:______V76.51____________________      Jeremy Johann CMA

## 2010-03-31 NOTE — Assessment & Plan Note (Signed)
Summary: ribs on right side are sore/ fell friday/lowne pt/kn   Vital Signs:  Patient profile:   77 year old male Height:      72.5 inches (184.15 cm) Weight:      131.13 pounds (59.60 kg) BMI:     17.60 Temp:     97.6 degrees F (36.44 degrees C) oral BP sitting:   120 / 70  (right arm) Cuff size:   large  Vitals Entered By: Lucious Groves CMA (October 11, 2009 8:59 AM) CC: C/O continued rib pain after fall on Friday./kb Is Patient Diabetic? No Pain Assessment Patient in pain? yes     Location: ribs Intensity: 6 Type: aching Onset of pain  Friday after falling Comments Patient notes that his nose bled three times after the fall and he has stopped his Plavix. Patient last dose of Plavix was on Friday./kb   History of Present Illness: 77 yo man here today for R sided rib pain.  fell on Friday while walking down the garage steps.  hit forehead, L knee, R wrist, R side.  had heavy nose bleed after fall.  has not taken Plavix since falling.  last nose bleed was Saturday morning.  now having a lot of pain on R side- painful to lie on that side, painful to turn.  pain is centered over R pec but does extend into back.  some pain w/ taking a deep breath- 'not much but a little'.  denies HAs, visual changes, dizziness since the fall.  Current Medications (verified): 1)  Viagra 100 Mg Tabs (Sildenafil Citrate) .Marland Kitchen.. 1 As Directed 2)  Plavix 75 Mg Tabs (Clopidogrel Bisulfate) .... Qd 3)  Vytorin 10-40 Mg  Tabs (Ezetimibe-Simvastatin) .... Take One Tablet Daily 4)  Fosamax 70 Mg Tabs (Alendronate Sodium) .Marland Kitchen.. 1 By Mouth Q Week 5)  Saw Palmetto 450 Mg Caps (Saw Palmetto (Serenoa Repens)) .... Take 1 Tab Three Times A Day 6)  Caltrate 600+d Plus 600-400 Mg-Unit Tabs (Calcium Carbonate-Vit D-Min) .Marland Kitchen.. 1 By Mouth Two Times A Day 7)  Multivitamins  Tabs (Multiple Vitamin) .Marland Kitchen.. 1 By Mouth Once Daily 8)  Vitamin D3 1000 Unit Tabs (Cholecalciferol) .Marland Kitchen.. 1 By Mouth Once Daily  Allergies (verified): 1)   ! Pcn 2)  ! Aspirin  Review of Systems      See HPI  Physical Exam  General:  very thin, no acute distress Head:  Normocephalic and atraumatic without obvious abnormalities. No apparent alopecia or balding. Eyes:  PERRL, EOMI Nose:  no obvious source of bleeding Chest Wall:  + TTP over R ribs from mid-clavicular to mid-axillary line.  no obvious bony deformity.  no bruising apparent Lungs:  Normal respiratory effort, chest expands symmetrically. Lungs are clear to auscultation, no crackles or wheezes. Heart:  normal rate and no murmur.   Extremities:  No clubbing, cyanosis, edema, or deformity noted Neurologic:  alert & oriented X3, cranial nerves II-XII intact, strength normal in all extremities, reflexes symmetric, and gait normal.   Skin:  bruising of R wrist, L hand, central forehead just above nose   Impression & Recommendations:  Problem # 1:  RIB PAIN, RIGHT SIDED (ICD-786.50) Assessment New pain after falling.  no obvious bony deformity.  check xrays.  vicodin as needed for pain- particularly for sleep.  neuro exam WNL- discussed CT scan w/ pt, no obvious cause at this time and pt not interested.  reviewed red flags that should prompt return.  Pt expresses understanding and is in agreement w/  this plan. Orders: T-Ribs Unilateral 2 Views ([id number])  Problem # 2:  CAROTID STENOSIS (ICD-433.10) Assessment: Unchanged restart plavix on Wednesday (5 days off meds).  pt in agreement and understands plan. His updated medication list for this problem includes:    Plavix 75 Mg Tabs (Clopidogrel bisulfate) ..... Qd  Complete Medication List: 1)  Viagra 100 Mg Tabs (Sildenafil citrate) .Marland Kitchen.. 1 as directed 2)  Plavix 75 Mg Tabs (Clopidogrel bisulfate) .... Qd 3)  Vytorin 10-40 Mg Tabs (Ezetimibe-simvastatin) .... Take one tablet daily 4)  Fosamax 70 Mg Tabs (Alendronate sodium) .Marland Kitchen.. 1 by mouth q week 5)  Saw Palmetto 450 Mg Caps (Saw palmetto (serenoa repens)) .... Take 1 tab three  times a day 6)  Caltrate 600+d Plus 600-400 Mg-unit Tabs (Calcium carbonate-vit d-min) .Marland Kitchen.. 1 by mouth two times a day 7)  Multivitamins Tabs (Multiple vitamin) .Marland Kitchen.. 1 by mouth once daily 8)  Vitamin D3 1000 Unit Tabs (Cholecalciferol) .Marland Kitchen.. 1 by mouth once daily 9)  Vicodin 5-500 Mg Tabs (Hydrocodone-acetaminophen) .Marland Kitchen.. 1 tab by mouth q4-6 as needed for severe pain  Patient Instructions: 1)  Please go to the [facility] on [address] to get your xrays- we'll call you with the results 2)  Take tylenol as needed for pain- Vicodin as needed for severe pain.  The vicodin may cause drowsiness- so no driving!!  The Vicodin has tylenol in it so do not take the 2 together 3)  Ice or heat the area for pain relief 4)  Restart the Plavix on Wednesday 5)  Call with any questions or concerns 6)  Hang in there!! Prescriptions: VICODIN 5-500 MG TABS (HYDROCODONE-ACETAMINOPHEN) 1 tab by mouth Q4-6 as needed for severe pain  #30 x 0   Entered and Authorized by:   Neena Rhymes MD   Signed by:   Neena Rhymes MD on 10/11/2009   Method used:   Print then Give to Patient   RxID:   [id number]

## 2010-03-31 NOTE — Miscellaneous (Signed)
  Clinical Lists Changes  Observations: Added new observation of FLU VAX: Historical (11/15/2009 10:41)      Immunization History:  Influenza Immunization History:    Influenza:  historical (11/15/2009)

## 2010-03-31 NOTE — Progress Notes (Signed)
  Phone Note Call from Patient Call back at [facility] Phone [phone]   Caller: Patient Summary of Call: Onset last night of facial swelling below the left eye in the maxillary region.  No erythema, fever, nasal congestion, HA, or purulent nasal d/c. No pain with eating.  Dennies facial rash.  No visual changes.  He is encouraged to consider evaluation through local urgent care or ER as our clinics are closed until Monday. Initial call taken by: Evelena Peat MD,  February 27, 2009 9:27 AM

## 2010-03-31 NOTE — Progress Notes (Signed)
Summary: RX  Phone Note Call from Patient   Caller: Patient Reason for Call: Refill Medication Summary of Call: PT CAME IN FOR LABS AND ASK FOR A REFILL ON VYTORIN 10/40 TABS THRU AUGUST.PLEASE CALL HIM WHEN SCRIPT IS READY TO PICK UP. Initial call taken by: Freddy Jaksch,  April 26, 2009 8:42 AM  Follow-up for Phone Call        Pt aware rx is ready. Army Fossa CMA  April 26, 2009 10:25 AM     Prescriptions: VYTORIN 10-40 MG  TABS (EZETIMIBE-SIMVASTATIN) Take one tablet daily  #90 x 1   Entered by:   Army Fossa CMA   Authorized by:   Loreen Freud DO   Signed by:   Army Fossa CMA on 04/26/2009   Method used:   Print then Give to Patient   RxID:   [id number]

## 2010-04-11 ENCOUNTER — Telehealth: Payer: Self-pay | Admitting: Family Medicine

## 2010-04-14 ENCOUNTER — Encounter (INDEPENDENT_AMBULATORY_CARE_PROVIDER_SITE_OTHER): Payer: Self-pay | Admitting: *Deleted

## 2010-04-14 ENCOUNTER — Other Ambulatory Visit (INDEPENDENT_AMBULATORY_CARE_PROVIDER_SITE_OTHER): Payer: Medicare Other

## 2010-04-14 ENCOUNTER — Other Ambulatory Visit: Payer: Self-pay | Admitting: Family Medicine

## 2010-04-14 ENCOUNTER — Encounter: Payer: Self-pay | Admitting: Family Medicine

## 2010-04-14 DIAGNOSIS — Z0000 Encounter for general adult medical examination without abnormal findings: Secondary | ICD-10-CM

## 2010-04-14 DIAGNOSIS — E785 Hyperlipidemia, unspecified: Secondary | ICD-10-CM

## 2010-04-14 DIAGNOSIS — N390 Urinary tract infection, site not specified: Secondary | ICD-10-CM

## 2010-04-14 DIAGNOSIS — M899 Disorder of bone, unspecified: Secondary | ICD-10-CM

## 2010-04-14 DIAGNOSIS — M949 Disorder of cartilage, unspecified: Secondary | ICD-10-CM

## 2010-04-14 LAB — HEPATIC FUNCTION PANEL
ALT: 15 U/L (ref 0–53)
AST: 25 U/L (ref 0–37)
Alkaline Phosphatase: 76 U/L (ref 39–117)
Bilirubin, Direct: 0.1 mg/dL (ref 0.0–0.3)
Total Bilirubin: 0.6 mg/dL (ref 0.3–1.2)

## 2010-04-14 LAB — CONVERTED CEMR LAB
Glucose, Urine, Semiquant: NEGATIVE
Nitrite: NEGATIVE
Specific Gravity, Urine: <1.005
WBC Urine, dipstick: NEGATIVE
pH: 7.5

## 2010-04-14 LAB — LIPID PANEL
LDL Cholesterol: 73 mg/dL (ref 0–99)
Total CHOL/HDL Ratio: 3
VLDL: 18.2 mg/dL (ref 0.0–40.0)

## 2010-04-14 LAB — TSH: TSH: 0.71 u[IU]/mL (ref 0.35–5.50)

## 2010-04-14 LAB — CBC WITH DIFFERENTIAL/PLATELET
Basophils Absolute: 0.1 10*3/uL (ref 0.0–0.1)
Basophils Relative: 0.7 % (ref 0.0–3.0)
Eosinophils Absolute: 0.2 10*3/uL (ref 0.0–0.7)
Lymphocytes Relative: 24.2 % (ref 12.0–46.0)
MCHC: 33.9 g/dL (ref 30.0–36.0)
Neutrophils Relative %: 64.7 % (ref 43.0–77.0)
Platelets: 254 10*3/uL (ref 150.0–400.0)
RBC: 4.09 Mil/uL — ABNORMAL LOW (ref 4.22–5.81)
WBC: 7 10*3/uL (ref 4.5–10.5)

## 2010-04-14 LAB — PSA: PSA: 2.67 ng/mL (ref 0.10–4.00)

## 2010-04-14 LAB — BASIC METABOLIC PANEL
BUN: 12 mg/dL (ref 6–23)
Chloride: 105 mEq/L (ref 96–112)
GFR: 83.16 mL/min (ref >60.00)
Potassium: 4.1 mEq/L (ref 3.5–5.1)
Sodium: 140 mEq/L (ref 135–145)

## 2010-04-15 ENCOUNTER — Encounter: Payer: Self-pay | Admitting: Family Medicine

## 2010-04-18 LAB — CONVERTED CEMR LAB: Testosterone Free: 56.2 pg/mL (ref 47.0–244.0)

## 2010-04-21 ENCOUNTER — Ambulatory Visit (INDEPENDENT_AMBULATORY_CARE_PROVIDER_SITE_OTHER): Payer: Medicare Other | Admitting: Family Medicine

## 2010-04-21 ENCOUNTER — Encounter: Payer: Self-pay | Admitting: Family Medicine

## 2010-04-21 DIAGNOSIS — E785 Hyperlipidemia, unspecified: Secondary | ICD-10-CM

## 2010-04-21 DIAGNOSIS — F528 Other sexual dysfunction not due to a substance or known physiological condition: Secondary | ICD-10-CM

## 2010-04-26 NOTE — Assessment & Plan Note (Signed)
Summary: review lab/cbs   Vital Signs:  Patient profile:   77 year old male Weight:      134.0 pounds Pulse rate:   80 / minute Pulse rhythm:   regular BP sitting:   112 / 64  (right arm) Cuff size:   regular  Vitals Entered By: [name] CMA Duncan Dull) (April 21, 2010 9:11 AM) CC: wants to review labs   History of Present Illness: Pt here to review labs and f/u cholesterol.   No complaints.  Current Medications (verified): 1)  Viagra 100 Mg Tabs (Sildenafil Citrate) .Marland Kitchen.. 1 As Directed 2)  Plavix 75 Mg Tabs (Clopidogrel Bisulfate) .... Qd 3)  Vytorin 10-40 Mg  Tabs (Ezetimibe-Simvastatin) .... Take One Tablet Daily 4)  Fosamax 70 Mg Tabs (Alendronate Sodium) .Marland Kitchen.. 1 By Mouth Q Week 5)  Saw Palmetto 450 Mg Caps (Saw Palmetto (Serenoa Repens)) .... Take 1 Tab Three Times A Day 6)  Caltrate 600+d Plus 600-400 Mg-Unit Tabs (Calcium Carbonate-Vit D-Min) .Marland Kitchen.. 1 By Mouth Two Times A Day 7)  Multivitamins  Tabs (Multiple Vitamin) .Marland Kitchen.. 1 By Mouth Once Daily 8)  Vitamin D3 1000 Unit Tabs (Cholecalciferol) .Marland Kitchen.. 1 By Mouth Once Daily 9)  Vicodin 5-500 Mg Tabs (Hydrocodone-Acetaminophen) .Marland Kitchen.. 1 Tab By Mouth Q4-6 As Needed For Severe Pain  Allergies (verified): 1)  ! Pcn 2)  ! Aspirin  Physical Exam  General:  Well-developed,well-nourished,in no acute distress; alert,appropriate and cooperative throughout examination Lungs:  Normal respiratory effort, chest expands symmetrically. Lungs are clear to auscultation, no crackles or wheezes. Heart:  normal rate and no murmur.   Extremities:  No clubbing, cyanosis, edema, or deformity noted with normal full range of motion of all joints.     Impression & Recommendations:  Problem # 1:  HYPERLIPIDEMIA NEC/NOS (ICD-272.4)  His updated medication list for this problem includes:    Zocor 40 Mg Tabs (Simvastatin) .Marland Kitchen... 1 by mouth at bedtime  Labs Reviewed: SGOT: 25 (04/14/2010)   SGPT: 15 (04/14/2010)   HDL:60.50 (04/14/2010), 54.00  (09/28/2009)  LDL:73 (04/14/2010), 70 (09/28/2009)  Chol:152 (04/14/2010), 137 (09/28/2009)  Trig:91.0 (04/14/2010), 66.0 (09/28/2009)  Problem # 2:  ERECTILE DYSFUNCTION (ICD-302.72)  His updated medication list for this problem includes:    Viagra 100 Mg Tabs (Sildenafil citrate) .Marland Kitchen... 1 as directed  Discussed proper use of medications, as well as side effects.  sample of cialis 20 mg given to pt today-- pt understands instructions and side effects  Problem # 3:  HYPERTENSION (ICD-401.9)  BP today: 112/64 Prior BP: 120/70 (10/11/2009)  Labs Reviewed: K+: 4.1 (04/14/2010) Creat: : 0.9 (04/14/2010)   Chol: 152 (04/14/2010)   HDL: 60.50 (04/14/2010)   LDL: 73 (04/14/2010)   TG: 91.0 (04/14/2010)  Complete Medication List: 1)  Viagra 100 Mg Tabs (Sildenafil citrate) .Marland Kitchen.. 1 as directed 2)  Plavix 75 Mg Tabs (Clopidogrel bisulfate) .... Qd 3)  Zocor 40 Mg Tabs (Simvastatin) .Marland Kitchen.. 1 by mouth at bedtime 4)  Fosamax 70 Mg Tabs (Alendronate sodium) .Marland Kitchen.. 1 by mouth q week 5)  Saw Palmetto 450 Mg Caps (Saw palmetto (serenoa repens)) .... Take 1 tab three times a day 6)  Caltrate 600+d Plus 600-400 Mg-unit Tabs (Calcium carbonate-vit d-min) .Marland Kitchen.. 1 by mouth two times a day 7)  Multivitamins Tabs (Multiple vitamin) .Marland Kitchen.. 1 by mouth once daily 8)  Vitamin D3 1000 Unit Tabs (Cholecalciferol) .Marland Kitchen.. 1 by mouth once daily 9)  Vicodin 5-500 Mg Tabs (Hydrocodone-acetaminophen) .Marland Kitchen.. 1 tab by mouth q4-6 as needed for severe  pain Prescriptions: ZOCOR 40 MG TABS (SIMVASTATIN) 1 by mouth at bedtime  #90 x 1   Entered and Authorized by:   Loreen Freud DO   Signed by:   Loreen Freud DO on 04/21/2010   Method used:   Print then Give to Patient   RxID:   [id number]    Orders Added: 1)  Est. Patient Level III [14782]

## 2010-05-05 NOTE — Progress Notes (Signed)
Summary: waiting for pt to call back  to make appt 2/15  Phone Note Call from Patient   Caller: Patient Summary of Call: patient thinks he is due for lab work--- this is what he had at bottom of office visit dated 7/29---are these diag codes ok??    old lab also has lab for testosterone--do you need to order anything about it--what diag code??       -  does he need an office visit too or just labs??   he thinks it is just for labs Initial call taken by: Jerolyn Shin,  April 11, 2010 2:39 PM  Follow-up for Phone Call        spoke w/ patient informed that he is due for 6 month f/u appt to see Dr. Laury Axon per last office visit 09/24/09 but wanted to check first thinks he is just due for labs ....Marland KitchenMarland KitchenDoristine Devoid CMA  April 11, 2010 4:57 PM   Additional Follow-up for Phone Call Additional follow up Details #1::        labs per order at bottom of cpe and free testosterone level  Additional Follow-up by: Loreen Freud DO,  April 12, 2010 9:46 AM    Additional Follow-up for Phone Call Additional follow up Details #2::     V70.0  272.4  733.0  cbcd, bmp, hep, lipid, psa ,  UA,  vita D,free testosterone level--- fasting ...Marland KitchenMarland KitchenFelecia Deloach CMA  April 12, 2010 9:56 AM   Additional Follow-up for Phone Call Additional follow up Details #3:: Details for Additional Follow-up Action Taken: pt aware of labs that are due and he also needs a 6 mo f/u--he voiced understanding and stated he would call back to schedule the appt.... When I offered to schedule the appt, he declined..... Additional Follow-up by: [name] CMA Duncan Dull),  April 13, 2010 1:57 PM

## 2010-06-29 ENCOUNTER — Other Ambulatory Visit: Payer: Federal, State, Local not specified - PPO

## 2010-07-12 NOTE — Consult Note (Signed)
NEW PATIENT CONSULTATION   Rick [name], Rick [name]  DOB:  October 25, 1933                                       07/27/2009  ZOXWR#:[id number]   REASON FOR VISIT:  Carotid occlusive disease.   Rick [name] is a 77 year old Caucasian male who Dr. Hart Rochester last saw in  February of 2008 for continued follow-up of carotid occlusive disease  with moderate left ICA stenosis with only mild right ICA stenosis.  He  has been followed since 2002.  He has been asymptomatic.  He does  continue to smoke and is being treated for hypercholesteremia.  He is on  Plavix as he has an allergy to aspirin.  He says he has been doing quite  well without history of TIA or stroke.  He specifically denies any  weakness, dysphagia or visual changes.   PAST MEDICAL HISTORY:  Hypercholesterolemia, ongoing tobacco abuse,  history of tonsillectomy and carotid occlusive disease as discussed  above.   FAMILY HISTORY:  He denies any premature cardiovascular disease in his  parents.   SOCIAL HISTORY:  He is married with 2 children.  He is retired from the  [employer].  He continues to smoke half a pack of cigarettes per day and has  done so for 50 years.  He drinks 2 servings of vodka per day.   REVIEW OF SYSTEMS:  GENERAL:  He denies any weight changes or fever.  CARDIAC:  He denies chest pain or arrhythmias.  PULMONARY:  Denies shortness of breath.  GI:  Denies hematochezia.  GU:  Denies dysuria.  VASCULAR:  Denies claudication symptoms.  NEUROLOGIC:  As above.  MUSCULOSKELETAL:  Without chronic joint pains.  PSYCHIATRIC:  Without anxiety or depression.  ENT:  No visual changes or dysphagia.  HEMATOLOGIC:  No history of anemia or clotting disorders.   He underwent carotid duplex exam today showing 1% to 39% of the right  internal carotid artery and 60% to 79% of the left internal carotid  artery with peak systolic pressure of 217 and end-diastolic of 62.  This  was no significant change from his Doppler  study in November of 2010.   PHYSICAL EXAMINATION:  Findings show blood pressure 106/62 in the left  arm, heart rate at 66, respirations 18.  General appearance:  This is a  well-nourished, well-developed, thin Caucasian male who is no acute  distress.  HEENT:  Head is normocephalic, atraumatic.  Sclera  nonicteric.  Oral mucosa pink and moist.  Tongue midline.  Face is  symmetric.  Neck:  Supple without lymphadenopathy.  He does have a left  carotid bruit.  Lung:  Lung sounds are clear.  Heart:  Regular rate and  rhythm.  No murmur, rub or gallop was noted.  Abdomen:  Soft, nontender,  nondistended.  Musculoskeletal:  Shows no major deformities.  Neurologic  exam:  Nonfocal with good upper and lower strength.  Extremities:  Show  2+ radial pulses and 1+ posterior tibial pulses bilaterally.   The patient continues to be asymptomatic of his moderate left internal  carotid artery disease.  He has only mild right internal carotid artery  disease.  Dr. Hart Rochester did review the carotid duplex exam and felt that  since his velocities have remained stable for several years and because  he has been asymptomatic, he can be followed and 1 year.  I  did  encourage him to quit smoking although he does not seem very agreeable  to this.  He will continue to stay on Plavix and continue follow-up with  his primary physician for his hypercholesterolemia.   Jerold Coombe, PA   Quita Skye. Hart Rochester, M.D.  Electronically Signed   AWZ/MEDQ  D:  07/27/2009  T:  07/27/2009  Job:  [id number]   cc:   Lelon Perla, DO

## 2010-07-12 NOTE — Procedures (Signed)
CAROTID DUPLEX EXAM   INDICATION:  Follow up carotid disease.   HISTORY:  Diabetes:  No  Cardiac:  No  Hypertension:  No  Smoking:  Yes  Previous Surgery:  No  CV History:  Currently asymptomatic  Amaurosis Fugax No, Paresthesias No, Hemiparesis No                                       RIGHT             LEFT  Brachial systolic pressure:         108               106  Brachial Doppler waveforms:         normal            normal  Vertebral direction of flow:        antegrade         antegrade  DUPLEX VELOCITIES (cm/sec)  CCA peak systolic                   83                72  ECA peak systolic                   121               131  ICA peak systolic                   76                217  ICA end diastolic                   25                62  PLAQUE MORPHOLOGY:                  mixed             mixed  PLAQUE AMOUNT:                      mild              moderate/severe  PLAQUE LOCATION:                    ICA/ECA           ICA/CCA   IMPRESSION:  1. A 1%-39% stenosis of the right internal carotid artery.  2. A 60%-79% stenosis of the left internal carotid artery.  3. No significant change in the Doppler velocities of the bilateral      internal carotid arteries noted when compared to the previous exam      on 01/12/2009.   ___________________________________________  Quita Skye. Hart Rochester, M.D.   CH/MEDQ  D:  07/28/2009  T:  07/28/2009  Job:  [id number]

## 2010-07-12 NOTE — Procedures (Signed)
CAROTID DUPLEX EXAM   INDICATION:  Followup, carotid endarterectomy.   HISTORY:  Diabetes:  No.  Cardiac:  No.  Hypertension:  No.  Smoking:  Yes.  Previous Surgery:  No.  CV History:  Asymptomatic.  Amaurosis Fugax No, Paresthesias No, Hemiparesis No                                       RIGHT             LEFT  Brachial systolic pressure:         118               118  Brachial Doppler waveforms:         Triphasic         Triphasic  Vertebral direction of flow:        Antegrade         Antegrade  DUPLEX VELOCITIES (cm/sec)  CCA peak systolic                   72                96  ECA peak systolic                   90                106  ICA peak systolic                   86                238  ICA end diastolic                   29                64  PLAQUE MORPHOLOGY:                  Calcific          Calcific  PLAQUE AMOUNT:                      Mild              Moderate  PLAQUE LOCATION:                    ICA/bifurcation   ICA/bifurcation   IMPRESSION:  1. Right internal carotid artery shows evidence of 20-39% stenosis.  2. Left internal carotid artery shows evidence of 60-79% stenosis.  3. No significant changes from previous study.   ___________________________________________  Quita Skye Hart Rochester, M.D.   AS/MEDQ  D:  07/09/2007  T:  07/09/2007  Job:  [id number]

## 2010-07-12 NOTE — Procedures (Signed)
CAROTID DUPLEX EXAM   INDICATION:  Followup of known carotid artery disease.   HISTORY:  Diabetes:  No.  Cardiac:  No.  Hypertension:  No.  Smoking:  Yes.  Previous Surgery:  No.  CV History:  No.  Amaurosis Fugax No, Paresthesias No, Hemiparesis No.                                       RIGHT             LEFT  Brachial systolic pressure:         120               124  Brachial Doppler waveforms:         Triphasic         Triphasic  Vertebral direction of flow:        Antegrade         Antegrade  DUPLEX VELOCITIES (cm/sec)  CCA peak systolic                   120               128  ECA peak systolic                   149               118  ICA peak systolic                   82                226  ICA end diastolic                   25                42  PLAQUE MORPHOLOGY:                  Mixed             Heterogenous  PLAQUE AMOUNT:                      Moderate          Mild-to-moderate  PLAQUE LOCATION:                    Proximal ICA      ICA   IMPRESSION:  1. Right internal carotid artery suggests 20-39% stenosis.  2. Left internal carotid artery suggests 40-59% stenosis (high end of      range).  3. Antegrade flow in bilateral vertebrals.   ___________________________________________  Quita Skye Hart Rochester, M.D.   CB/MEDQ  D:  01/12/2009  T:  01/12/2009  Job:  [id number]

## 2010-07-12 NOTE — Procedures (Signed)
CAROTID DUPLEX EXAM   INDICATION:  Follow-up carotid artery disease.   HISTORY:  Diabetes:  No.  Cardiac:  No.  Hypertension:  No.  Smoking:  Yes.  Previous Surgery:  No.  CV History:  Asymptomatic.  Amaurosis Fugax No, Paresthesias No, Hemiparesis No.                                       RIGHT             LEFT  Brachial systolic pressure:         122               120  Brachial Doppler waveforms:         Normal            Normal  Vertebral direction of flow:        Antegrade         Antegrade  DUPLEX VELOCITIES (cm/sec)  CCA peak systolic                   100               135  ECA peak systolic                   103               106  ICA peak systolic                   95                259  ICA end diastolic                   27                65  PLAQUE MORPHOLOGY:                  Calcific          Calcific  PLAQUE AMOUNT:                      Mild              Moderate  PLAQUE LOCATION:                    ICA/ECA           ICA/ECA   IMPRESSION:  1. A 1-39% stenosis of the right internal carotid artery.  2. High-end 60-79% stenosis of the left internal carotid artery.  3. No significant change noted when compared to the previous exam on      07/09/2007.   ___________________________________________  Quita Skye. Hart Rochester, M.D.   CH/MEDQ  D:  01/20/2008  T:  01/20/2008  Job:  [id number]

## 2010-07-12 NOTE — Procedures (Signed)
CAROTID DUPLEX EXAM   INDICATION:  Follow-up evaluation of known carotid artery disease.   HISTORY:  Diabetes:  No.  Cardiac:  No.  Hypertension:  No.  Smoking:  Yes.  Previous Surgery:  No.  CV History:  Patient reports no cerebrovascular symptoms at this time.  Previous duplex on 01/20/08 revealed 20-39% right ICA stenosis and 60-  79% left ICA stenosis.  Amaurosis Fugax No, Paresthesias No, Hemiparesis No.                                       RIGHT             LEFT  Brachial systolic pressure:         130               130  Brachial Doppler waveforms:         Triphasic         Triphasic  Vertebral direction of flow:        Antegrade         Antegrade  DUPLEX VELOCITIES (cm/sec)  CCA peak systolic                   84                96  ECA peak systolic                   131               110  ICA peak systolic                   81                235  ICA end diastolic                   32                67  PLAQUE MORPHOLOGY:                  Mixed             Mixed  PLAQUE AMOUNT:                      Moderate          Moderate-to-severe  PLAQUE LOCATION:                    Proximal ICA      Proximal ICA   IMPRESSION:  1. 20-39% right internal carotid artery stenosis.  2. 60-79% left internal carotid artery stenosis.  3. No significant change from previous study.      ___________________________________________  Quita Skye Hart Rochester, M.D.   MC/MEDQ  D:  07/21/2008  T:  07/21/2008  Job:  [id number]

## 2010-07-26 ENCOUNTER — Other Ambulatory Visit: Payer: Self-pay

## 2010-08-02 ENCOUNTER — Other Ambulatory Visit: Payer: Self-pay

## 2010-08-04 ENCOUNTER — Other Ambulatory Visit (INDEPENDENT_AMBULATORY_CARE_PROVIDER_SITE_OTHER): Payer: Medicare Other

## 2010-08-04 DIAGNOSIS — I6529 Occlusion and stenosis of unspecified carotid artery: Secondary | ICD-10-CM

## 2010-08-09 NOTE — Procedures (Unsigned)
CAROTID DUPLEX EXAM  INDICATION:  Followup carotid disease.  HISTORY: Diabetes:  No Cardiac:  No Hypertension:  No Smoking:  Yes Previous Surgery:  No CV History: Amaurosis Fugax No, Paresthesias No, Hemiparesis No                                      RIGHT             LEFT Brachial systolic pressure:         120               120 Brachial Doppler waveforms:         WNL               WNL Vertebral direction of flow:        Antegrade         Antegrade DUPLEX VELOCITIES (cm/sec) CCA peak systolic                   77                88 ECA peak systolic                   101               90 ICA peak systolic                   68                246 ICA end diastolic                   23                67 PLAQUE MORPHOLOGY:                  Calcific          Calcific PLAQUE AMOUNT:                      Mild              Moderate to severe PLAQUE LOCATION:                    ICA               ICA/CCA  IMPRESSION: 1. 1% to 39% right internal carotid artery stenosis. 2. 60% to 79% left internal carotid artery stenosis. 3. Bilateral vertebral arteries are within normal limits. 4. Stable velocities compared to prior exam of 07/27/2009. 5. Incidental Finding:  Multiple nonvascularized cystic structures     observed on the thyroid measuring between 0.21 cm and 0.53 cm.  ___________________________________________ Quita Skye. Hart Rochester, M.D.  LT/MEDQ  D:  08/04/2010  T:  08/04/2010  Job:  [id number]

## 2010-09-01 ENCOUNTER — Other Ambulatory Visit: Payer: Self-pay | Admitting: Family Medicine

## 2010-09-01 MED ORDER — CLOPIDOGREL BISULFATE 75 MG PO TABS: 75.0000 mg | ORAL_TABLET | Freq: Every day | ORAL | Status: DC

## 2010-09-01 NOTE — Telephone Encounter (Signed)
Done

## 2010-10-10 ENCOUNTER — Ambulatory Visit (INDEPENDENT_AMBULATORY_CARE_PROVIDER_SITE_OTHER): Payer: Medicare Other | Admitting: Family Medicine

## 2010-10-10 ENCOUNTER — Encounter: Payer: Self-pay | Admitting: Family Medicine

## 2010-10-10 DIAGNOSIS — N429 Disorder of prostate, unspecified: Secondary | ICD-10-CM

## 2010-10-10 DIAGNOSIS — M810 Age-related osteoporosis without current pathological fracture: Secondary | ICD-10-CM

## 2010-10-10 DIAGNOSIS — F17200 Nicotine dependence, unspecified, uncomplicated: Secondary | ICD-10-CM

## 2010-10-10 DIAGNOSIS — R0989 Other specified symptoms and signs involving the circulatory and respiratory systems: Secondary | ICD-10-CM

## 2010-10-10 DIAGNOSIS — I10 Essential (primary) hypertension: Secondary | ICD-10-CM

## 2010-10-10 DIAGNOSIS — Z0000 Encounter for general adult medical examination without abnormal findings: Secondary | ICD-10-CM | POA: Insufficient documentation

## 2010-10-10 DIAGNOSIS — Z87898 Personal history of other specified conditions: Secondary | ICD-10-CM

## 2010-10-10 DIAGNOSIS — E785 Hyperlipidemia, unspecified: Secondary | ICD-10-CM

## 2010-10-10 DIAGNOSIS — N400 Enlarged prostate without lower urinary tract symptoms: Secondary | ICD-10-CM

## 2010-10-10 DIAGNOSIS — N529 Male erectile dysfunction, unspecified: Secondary | ICD-10-CM

## 2010-10-10 MED ORDER — ALENDRONATE SODIUM 70 MG PO TABS: 70.0000 mg | ORAL_TABLET | ORAL | Status: DC

## 2010-10-10 NOTE — Assessment & Plan Note (Signed)
con't meds  Check labs 

## 2010-10-10 NOTE — Assessment & Plan Note (Signed)
Stable con't meds 

## 2010-10-10 NOTE — Patient Instructions (Signed)
Preventative Care for Adults, Male   MAINTAIN REGULAR HEALTH EXAMS   A routine yearly physical is a good way to check in with your primary care provider about your health and preventive screening. It is also an opportunity to share updates about your health and any concerns you have and receive a thorough all-over exam.   If you smoke or chew tobacco, find out from your caregiver how to quit. It can literally save your life, no matter how long you have been a tobacco user. If you do not use tobacco, never begin.   Maintain a healthy diet and normal weight. Increased weight leads to problems with blood pressure and diabetes. Decrease saturated fat in the diet and increase regular exercise. Get information about proper diet from your caregiver if necessary. Eat a variety of foods, including fruit, vegetables, animal or vegetable protein, such as meat, fish, chicken, and eggs, or beans, lentils, tofu, and grains, such as rice.   High blood pressure causes heart and blood vessel problems. Fat leaves deposits in your arteries that can block them. This causes heart disease and vessel disease elsewhere in your body. Check your blood pressure regularly and keep it within normal limits. Men over age 50 or those who have a family history of high blood pressure should have it checked at least every year.   Aerobic exercise helps maintain good heart health. 30 minutes of moderate-intensity exercise is recommended. For example, a brisk walk that increases your heart rate and breathing. This walk should be done on most days of the week. Persistent high blood pressure should be treated with medicine if weight loss and exercise do not work.   For many men aged 20 and older, having a cholesterol test of the blood every 5 years is recommended. If your cholesterol is found to be borderline high, or if you have heart disease or certain other medical conditions, then you may need to have it monitored more frequently.   Avoid smoking,  drinking alcohol in excess (more than two drinks per day) or use of street drugs. Do not share needles with anyone. Ask for professional help if you need assistance or instructions on stopping the use of alcohol, cigarettes, and/or drugs.   Maintain normal blood lipids and cholesterol by minimizing your intake of saturated fat. Eat a well rounded diet otherwise, with plenty of fruit and vegetables. The National Institutes of Health encourage men to eat 5-9 servings of fruit and vegetables each day. Your caregiver can help you keep your risk of heart disease or stroke at a lower level.   Ask your caregiver if you are in need of earlier testing because of: a strong family history of heart disease, or you have signs of elevated testosterone (male sex hormone) levels. This can predispose you to earlier heart disease. Ask if you should have a stress test if your history suggests this. A stress test is a test done on a treadmill that looks for heart disease. This test can find disease prior to there being a problem.   Diabetes screening assesses your blood sugar level after a fasting once every 3 years after age 45 if previous tests were normal.   Most routine colon cancer screening begins at the age of 50. On a yearly basis, doctors may provide special easy to use take-home tests to check for hidden blood in the stool. Sigmoidoscopy or colonoscopy can detect the earliest forms of colon cancer and is life saving. These test use a   small camera at the end of a tube to directly examine the colon. Speak to your caregiver about this at age 50, when routine screening begins (and is repeated every 5 years unless early forms of pre-cancerous polyps or small growths are found).   At the age of 50 men usually start screening for prostate cancer every year. Screening may begin at a younger age for those with higher risk. Those at higher risk include African-Americans or having a family history of prostate cancer. There are two types  of tests for prostate cancer:   Prostate-specific antigen (PSA) testing. Recent studies raise questions about prostate cancer using PSA and you should discuss this with your caregiver.   Digital rectal exam (in which your doctor’s lubricated and gloved finger feels for enlargement of the prostate through the anus).   Practice safe sex. Use condoms. Condoms are used for birth control and to help reduce the spread of sexually transmitted infections (or STIs). Unsafe sex is having an unprotected physical relationship with someone who is bisexual, homosexual, uses intravenous street drugs, or going with someone who has sexual relations with high-risk groups. Practicing safe sex helps you avoid getting an STI. Some of the STIs are gonorrhea (the clap), chlamydia, syphilis, trichimonas, herpes, HPV (human papiloma virus) and HIV (human immunodeficiency virus) which causes AIDS. The herpes, HIV and HPV are viral illnesses that have no cure. These can result in disability, cancer and death.   It is not safe for someone who has AIDS or is HIV positive to have unprotected sex with someone else who is positive. The reason for this is the fact that there are many different strains of HIV. If you have a strain that is readily treated with medications and then suddenly introduce a strain from a partner that has no further treatment options, you may suddenly have a strain of HIV that is untreatable. Even if you are both positive for HIV, it is still necessary to practice safe sex.   Use sunscreen with a SPF (or skin protection factor) of 15 or greater. Apply sunscreen liberally and repeatedly throughout the day. Being outside in the sun when your shadow caused by the sun is shorter than you are, means you are being exposed to sun at greater intensity. Lighter skinned people are at a greater risk of skin cancer. Don’t forget to also wear sunglasses in order to protect your eyes from too much damaging sunlight. Damaging sunlight can  accelerate cataract formation.   Once a month do a whole body skin exam or review, using a mirror to look at your back. Notify your caregivers of changes in moles, especially if there are changes in shapes, colors, a size larger than a pencil eraser, an irregular border, or development of new moles.   Keep carbon monoxide and smoke detectors in your home functioning at all times. Change the batteries every 6 months or use a model that plugs into the wall.   Do a monthly exam of your testicles. Gently roll each testicle between your thumb and fingers, feeling for any abnormal lumps. The best time to do this is after a hot shower or bath when the tissues are looser. Notify your caregivers of any lumps, tenderness or changes in size or shape immediately.   Stay up to date with your tetanus shots and other required immunizations. You should have a booster for tetanus every 10 years. Be sure to get your flu shot every year, since 5%-20% of the [place] population   comes down with the flu. The flu vaccine changes each year, so being vaccinated once is not enough. Get your shot in the fall, before the flu season peaks. The table below lists important vaccines to get. Other vaccines to consider include:   Hepatitis A virus (to prevent a form of infection of the liver by a virus acquired from food), Varicella Zoster (a virus that causes shingles).   Meningococcal (against bacteria which cause a form of meningitis).   Brush your teeth twice a day with fluoride toothpaste, and floss once a day. Good oral hygiene prevents tooth decay and gum disease. The problems can be painful, unattractive, and can cause other health problems. Visit your dentist for a routine oral and dental check up and preventive care every 6-12 months.   The Body Mass Index or BMI is a way of measuring how much of your body is fat. Having a BMI above 27 increases the risk of heart disease, diabetes, hypertension, stroke and other problems related to obesity.  Your caregiver can help determine your BMI and based on it develop an exercise and dietary program to help you achieve or maintain this important measurement at a healthful level.   Wear seat belts whenever in a vehicle, whether a passenger or driver, and even for very short drives of a few minutes.   If you bicycle, wear a helmet at all times.   Below is a summary of the most important preventative healthcare services that adult males should seek on a regular basis throughout their lives:   Preventative Care for Adult Males    Preventative Service  Ages 19-39  Ages 40-64  Ages 65 and over    Schedule of medical visits  Every 5 years  Every 5 years     Schedule of dental visits  Every 6-12 months  Every 6-12 months     Health risk assessment and lifestyle counseling  Every 3-5 years  Every 3-5 years  Every 3-5 years    Blood pressure check**  Every 2 years  Every 2 years  Every 2 years    Total cholesterol check including HDL**  Every 5 years beginning at age 35  Every 5 years  Every 5 years through age 75, then optional.    Flexible sigmoidoscopy or colonoscopy**   Every 5 years beginning at age 50  Every 5 years through age 80, then optional.    Prostate screening   Every year beginning at age 50  Every Year    Testicular exam  Monthly  Monthly  Monthly    FOBT (fecal occult blood test)   Every year beginning at age 50  Every year until 80, then optional.    Skin self-exam  Monthly  Monthly  Monthly    Tetanus-diphtheria (Td) immunization  Every 10 years  Every 10 years  Every 10 years    Influenza immunization**  Every year  Every year  Every year    Pneumococcal immunization**  Optional  Optional  Every 5 years    Hepatitis B immunization**  Series of 3 immunizations   (if not done previously, usually given at 0, 1 to 2, and 4 to 6 months)  Check with your caregiver if vaccination not previously given.  Check with your caregiver if vaccination not previously given.    **Family history and personal history of  risk and conditions may change your physician's recommendations.    Document Released: 04/11/2001 Document Re-Released: 05/10/2009   [employer]® Patient Information ©  2011 [employer].

## 2010-10-10 NOTE — Assessment & Plan Note (Signed)
F/u urology.  

## 2010-10-10 NOTE — Assessment & Plan Note (Signed)
D/w quitting Pt not ready

## 2010-10-10 NOTE — Assessment & Plan Note (Signed)
Check bmd con't fosamax 

## 2010-10-10 NOTE — Assessment & Plan Note (Signed)
ghm utd Check labs  

## 2010-10-10 NOTE — Progress Notes (Signed)
  Subjective:    Patient ID: Rick [name], male    DOB: [birth year], 77 y.o.   MRN: [id number]  HPI Pt here for cpe.  No complaints.   Review of Systems  Review of Systems  Constitutional: Negative for activity change, appetite change and fatigue.  HENT: Negative for hearing loss, congestion, tinnitus and ear discharge.  dentist q22m  Eyes: Negative for visual disturbance (see optho q1y -- vision corrected to 20/20 with glasses).  Respiratory: Negative for cough, chest tightness and shortness of breath.   Cardiovascular: Negative for chest pain, palpitations and leg swelling.  Gastrointestinal: Negative for abdominal pain, diarrhea, constipation and abdominal distention.  Genitourinary: Negative for urgency, frequency, decreased urine volume and difficulty urinating.  Musculoskeletal: Negative for back pain, arthralgias and gait problem.  Skin: Negative for color change, pallor and rash.  Neurological: Negative for dizziness, light-headedness, numbness and headaches.  Hematological: Negative for adenopathy. Does not bruise/bleed easily.  Psychiatric/Behavioral: Negative for suicidal ideas, confusion, sleep disturbance, self-injury, dysphoric mood, decreased concentration and agitation.  Pt is able to read and write and can do all ADLs No risk for falling No abuse/ violence in home      Objective:   Physical Exam BP 140/80  Pulse 72  Ht 6' (1.829 m)  Wt 126 lb 9.6 oz (57.425 kg)  BMI 17.17 kg/m2  General Appearance:    Alert, cooperative, no distress, appears stated age  Head:    Normocephalic, without obvious abnormality, atraumatic  Eyes:    PERRL, conjunctiva/corneas clear, EOM's intact, fundi    benign, both eyes       Ears:    Normal TM's and external ear canals, both ears  Nose:   Nares normal, septum midline, mucosa normal, no drainage   or sinus tenderness  Throat:   Lips, mucosa, and tongue normal; teeth and gums normal  Neck:   Supple, symmetrical, trachea midline,  no adenopathy;       thyroid:  No enlargement/tenderness/nodules--- + L carotid bruit,  No jvd  Back:     Symmetric, no curvature, ROM normal, no CVA tenderness  Lungs:     Clear to auscultation bilaterally, respirations unlabored  Chest wall:    No tenderness or deformity  Heart:    Regular rate and rhythm, S1 and S2 normal, no murmur, rub   or gallop  Abdomen:     Soft, non-tender, bowel sounds active all four quadrants,    no masses, no organomegaly  Genitalia:    Normal male without lesion, discharge or tenderness  Rectal:    Normal tone, normal prostate, no masses or tenderness;   guaiac negative stool  Extremities:   Extremities normal, atraumatic, no cyanosis or edema  Pulses:   2+ and symmetric all extremities  Skin:   Skin color, texture, turgor normal, no rashes or lesions  Lymph nodes:   Cervical, supraclavicular, and axillary nodes normal  Neurologic:   CNII-XII intact. Normal strength, sensation and reflexes      throughout         Assessment & Plan:

## 2010-10-11 ENCOUNTER — Other Ambulatory Visit: Payer: Self-pay | Admitting: Family Medicine

## 2010-10-12 ENCOUNTER — Other Ambulatory Visit (INDEPENDENT_AMBULATORY_CARE_PROVIDER_SITE_OTHER): Payer: Medicare Other

## 2010-10-12 DIAGNOSIS — I10 Essential (primary) hypertension: Secondary | ICD-10-CM

## 2010-10-12 DIAGNOSIS — E785 Hyperlipidemia, unspecified: Secondary | ICD-10-CM

## 2010-10-12 DIAGNOSIS — N429 Disorder of prostate, unspecified: Secondary | ICD-10-CM

## 2010-10-12 DIAGNOSIS — E291 Testicular hypofunction: Secondary | ICD-10-CM

## 2010-10-12 DIAGNOSIS — N529 Male erectile dysfunction, unspecified: Secondary | ICD-10-CM

## 2010-10-12 LAB — LIPID PANEL
Cholesterol: 145 mg/dL (ref 0–200)
HDL: 62.7 mg/dL (ref >39.00)
LDL Cholesterol: 69 mg/dL (ref 0–99)
Triglycerides: 67 mg/dL (ref 0.0–149.0)
VLDL: 13.4 mg/dL (ref 0.0–40.0)

## 2010-10-12 LAB — BASIC METABOLIC PANEL
BUN: 10 mg/dL (ref 6–23)
Calcium: 9 mg/dL (ref 8.4–10.5)
Creatinine, Ser: 0.9 mg/dL (ref 0.4–1.5)

## 2010-10-12 LAB — HEPATIC FUNCTION PANEL
ALT: 15 U/L (ref 0–53)
AST: 21 U/L (ref 0–37)
Albumin: 3.7 g/dL (ref 3.5–5.2)
Alkaline Phosphatase: 64 U/L (ref 39–117)
Bilirubin, Direct: 0.2 mg/dL (ref 0.0–0.3)
Total Protein: 6.3 g/dL (ref 6.0–8.3)

## 2010-10-12 LAB — CBC WITH DIFFERENTIAL/PLATELET
Basophils Relative: 0.5 % (ref 0.0–3.0)
Eosinophils Absolute: 0.2 10*3/uL (ref 0.0–0.7)
Eosinophils Relative: 2.9 % (ref 0.0–5.0)
Lymphocytes Relative: 21.6 % (ref 12.0–46.0)
MCHC: 33.4 g/dL (ref 30.0–36.0)
Monocytes Absolute: 0.5 10*3/uL (ref 0.1–1.0)
Neutrophils Relative %: 67.6 % (ref 43.0–77.0)
Platelets: 224 10*3/uL (ref 150.0–400.0)
RBC: 3.86 Mil/uL — ABNORMAL LOW (ref 4.22–5.81)
WBC: 7.3 10*3/uL (ref 4.5–10.5)

## 2010-10-12 LAB — PSA: PSA: 2.66 ng/mL (ref 0.10–4.00)

## 2010-10-12 NOTE — Progress Notes (Signed)
Labs only

## 2010-10-13 ENCOUNTER — Other Ambulatory Visit (INDEPENDENT_AMBULATORY_CARE_PROVIDER_SITE_OTHER): Payer: Medicare Other

## 2010-10-13 ENCOUNTER — Encounter: Payer: Self-pay | Admitting: Family Medicine

## 2010-10-13 DIAGNOSIS — R0989 Other specified symptoms and signs involving the circulatory and respiratory systems: Secondary | ICD-10-CM

## 2010-10-14 ENCOUNTER — Encounter: Payer: Self-pay | Admitting: Family Medicine

## 2010-10-21 NOTE — Procedures (Unsigned)
CAROTID DUPLEX EXAM  INDICATION:  Follow up carotid stenosis.  HISTORY: Diabetes:  No. Cardiac:  No. Hypertension:  No. Smoking:  Currently. Previous Surgery:  No carotid intervention. CV History:  Asymptomatic. Amaurosis Fugax No, Paresthesias No, Hemiparesis No.                                      RIGHT             LEFT Brachial systolic pressure:         112               108 Brachial Doppler waveforms:         WNL               WNL Vertebral direction of flow:        Antegrade         Antegrade DUPLEX VELOCITIES (cm/sec) CCA peak systolic                   90                100 ECA peak systolic                   127               124 ICA peak systolic                   100               349 ICA end diastolic                   32                81 PLAQUE MORPHOLOGY:                  Calcified         Calcified PLAQUE AMOUNT:                      Mild              Moderate-to-severe PLAQUE LOCATION:                    CCA, ICA          CCA, ICA  IMPRESSION: 1. Right internal carotid artery stenosis in the 1% to 39% range. 2. Left internal carotid artery stenosis in the 60% to 79% range. 3. Patent bilateral external carotid arteries. 4. Patent antegrade vertebral arteries bilaterally. 5. Stable and unchanged since studies performed on 08/04/2010 and     07/27/2009.  ___________________________________________ Quita Skye. Hart Rochester, M.D.  SH/MEDQ  D:  10/13/2010  T:  10/13/2010  Job:  [id number]

## 2010-10-24 ENCOUNTER — Other Ambulatory Visit: Payer: Medicare Other

## 2010-11-02 ENCOUNTER — Ambulatory Visit (INDEPENDENT_AMBULATORY_CARE_PROVIDER_SITE_OTHER)
Admission: RE | Admit: 2010-11-02 | Discharge: 2010-11-02 | Disposition: A | Discharge status: Home / Self Care | Payer: Medicare Other | Source: Ambulatory Visit | Attending: Family Medicine | Admitting: Family Medicine

## 2010-11-02 DIAGNOSIS — M810 Age-related osteoporosis without current pathological fracture: Secondary | ICD-10-CM

## 2010-11-09 ENCOUNTER — Telehealth: Payer: Self-pay

## 2010-11-09 NOTE — Telephone Encounter (Signed)
Discussed with patient and he voiced understanding. Ref put in      KP  

## 2010-11-09 NOTE — Telephone Encounter (Signed)
+   osteoporosis---- Severe ----con't fosamax Refer to rheum Lelon Perla, DO More Detail >>   Lelon Perla, DO       Sent: Mon November 07, 2010  8:22 PM    To: [name], CMA      Rick [name]    MRN: [id number] DOB: 1933/05/26   Pt Home: [phone]          msgs left to call the office    KP

## 2010-12-13 ENCOUNTER — Other Ambulatory Visit: Payer: Self-pay | Admitting: Family Medicine

## 2010-12-13 MED ORDER — EZETIMIBE-SIMVASTATIN 10-40 MG PO TABS: 1.0000 | ORAL_TABLET | Freq: Every day | ORAL | Status: DC

## 2010-12-13 NOTE — Telephone Encounter (Signed)
Refill vytorin 10/40mg  - [facility] 90 day supply 3 refills  --- cell [phone]

## 2011-03-01 ENCOUNTER — Other Ambulatory Visit: Payer: Self-pay

## 2011-03-01 MED ORDER — CLOPIDOGREL BISULFATE 75 MG PO TABS: 75.0000 mg | ORAL_TABLET | Freq: Every day | ORAL | Status: DC

## 2011-03-01 NOTE — Telephone Encounter (Signed)
Wife called in to request a refill on plavix---CPE was done 8/12 and labs are current.Marland KitchenMarland KitchenFaxed

## 2011-03-09 DIAGNOSIS — L819 Disorder of pigmentation, unspecified: Secondary | ICD-10-CM | POA: Diagnosis not present

## 2011-03-09 DIAGNOSIS — I781 Nevus, non-neoplastic: Secondary | ICD-10-CM | POA: Diagnosis not present

## 2011-03-09 DIAGNOSIS — L821 Other seborrheic keratosis: Secondary | ICD-10-CM | POA: Diagnosis not present

## 2011-03-09 DIAGNOSIS — L259 Unspecified contact dermatitis, unspecified cause: Secondary | ICD-10-CM | POA: Diagnosis not present

## 2011-03-09 DIAGNOSIS — L570 Actinic keratosis: Secondary | ICD-10-CM | POA: Diagnosis not present

## 2011-03-09 DIAGNOSIS — D485 Neoplasm of uncertain behavior of skin: Secondary | ICD-10-CM | POA: Diagnosis not present

## 2011-03-30 ENCOUNTER — Other Ambulatory Visit: Payer: Self-pay

## 2011-03-30 MED ORDER — EZETIMIBE-SIMVASTATIN 10-40 MG PO TABS: 1.0000 | ORAL_TABLET | Freq: Every day | ORAL | Status: DC

## 2011-04-13 ENCOUNTER — Other Ambulatory Visit: Payer: Self-pay | Admitting: Family Medicine

## 2011-04-13 DIAGNOSIS — E785 Hyperlipidemia, unspecified: Secondary | ICD-10-CM

## 2011-04-13 DIAGNOSIS — D689 Coagulation defect, unspecified: Secondary | ICD-10-CM

## 2011-04-14 ENCOUNTER — Other Ambulatory Visit (INDEPENDENT_AMBULATORY_CARE_PROVIDER_SITE_OTHER): Payer: Medicare Other

## 2011-04-14 DIAGNOSIS — E785 Hyperlipidemia, unspecified: Secondary | ICD-10-CM | POA: Diagnosis not present

## 2011-04-14 DIAGNOSIS — D689 Coagulation defect, unspecified: Secondary | ICD-10-CM | POA: Diagnosis not present

## 2011-04-14 LAB — HEPATIC FUNCTION PANEL
AST: 25 U/L (ref 0–37)
Albumin: 3.6 g/dL (ref 3.5–5.2)
Alkaline Phosphatase: 82 U/L (ref 39–117)
Bilirubin, Direct: 0.1 mg/dL (ref 0.0–0.3)
Total Protein: 6.7 g/dL (ref 6.0–8.3)

## 2011-04-14 LAB — CBC WITH DIFFERENTIAL/PLATELET
Basophils Absolute: 0.1 10*3/uL (ref 0.0–0.1)
Eosinophils Absolute: 0.1 10*3/uL (ref 0.0–0.7)
Hemoglobin: 13.7 g/dL (ref 13.0–17.0)
Lymphocytes Relative: 22 % (ref 12.0–46.0)
Lymphs Abs: 1.7 10*3/uL (ref 0.7–4.0)
MCHC: 33.3 g/dL (ref 30.0–36.0)
Neutro Abs: 5.4 10*3/uL (ref 1.4–7.7)
Platelets: 257 10*3/uL (ref 150.0–400.0)
RDW: 13.7 % (ref 11.5–14.6)

## 2011-04-14 LAB — LIPID PANEL
Cholesterol: 142 mg/dL (ref 0–200)
LDL Cholesterol: 59 mg/dL (ref 0–99)
Triglycerides: 118 mg/dL (ref 0.0–149.0)

## 2011-04-14 NOTE — Progress Notes (Signed)
Labs only

## 2011-06-07 ENCOUNTER — Encounter: Payer: Self-pay | Admitting: Gastroenterology

## 2011-08-04 ENCOUNTER — Other Ambulatory Visit: Payer: Medicare Other

## 2011-09-12 DIAGNOSIS — H2510 Age-related nuclear cataract, unspecified eye: Secondary | ICD-10-CM | POA: Diagnosis not present

## 2011-10-05 ENCOUNTER — Other Ambulatory Visit: Payer: Self-pay | Admitting: Dermatology

## 2011-10-05 DIAGNOSIS — D0420 Carcinoma in situ of skin of unspecified ear and external auricular canal: Secondary | ICD-10-CM | POA: Diagnosis not present

## 2011-10-05 DIAGNOSIS — D485 Neoplasm of uncertain behavior of skin: Secondary | ICD-10-CM | POA: Diagnosis not present

## 2011-10-05 DIAGNOSIS — L570 Actinic keratosis: Secondary | ICD-10-CM | POA: Diagnosis not present

## 2011-10-05 DIAGNOSIS — L819 Disorder of pigmentation, unspecified: Secondary | ICD-10-CM | POA: Diagnosis not present

## 2011-10-05 DIAGNOSIS — I781 Nevus, non-neoplastic: Secondary | ICD-10-CM | POA: Diagnosis not present

## 2011-11-13 ENCOUNTER — Telehealth: Payer: Self-pay | Admitting: Gastroenterology

## 2011-11-13 ENCOUNTER — Encounter: Payer: Self-pay | Admitting: Family Medicine

## 2011-11-13 ENCOUNTER — Ambulatory Visit (INDEPENDENT_AMBULATORY_CARE_PROVIDER_SITE_OTHER): Payer: Medicare Other | Admitting: Family Medicine

## 2011-11-13 VITALS — BP 116/62 | HR 61 | Temp 97.8°F | Ht 72.0 in | Wt 130.6 lb

## 2011-11-13 DIAGNOSIS — I2510 Atherosclerotic heart disease of native coronary artery without angina pectoris: Secondary | ICD-10-CM | POA: Diagnosis not present

## 2011-11-13 DIAGNOSIS — I6529 Occlusion and stenosis of unspecified carotid artery: Secondary | ICD-10-CM

## 2011-11-13 DIAGNOSIS — N400 Enlarged prostate without lower urinary tract symptoms: Secondary | ICD-10-CM

## 2011-11-13 DIAGNOSIS — Z0000 Encounter for general adult medical examination without abnormal findings: Secondary | ICD-10-CM

## 2011-11-13 DIAGNOSIS — E785 Hyperlipidemia, unspecified: Secondary | ICD-10-CM

## 2011-11-13 DIAGNOSIS — M810 Age-related osteoporosis without current pathological fracture: Secondary | ICD-10-CM | POA: Diagnosis not present

## 2011-11-13 DIAGNOSIS — Z87898 Personal history of other specified conditions: Secondary | ICD-10-CM

## 2011-11-13 DIAGNOSIS — N390 Urinary tract infection, site not specified: Secondary | ICD-10-CM | POA: Diagnosis not present

## 2011-11-13 DIAGNOSIS — Z86010 Personal history of colonic polyps: Secondary | ICD-10-CM

## 2011-11-13 LAB — HEPATIC FUNCTION PANEL
AST: 22 U/L (ref 0–37)
Albumin: 3.9 g/dL (ref 3.5–5.2)
Alkaline Phosphatase: 65 U/L (ref 39–117)
Total Protein: 6.8 g/dL (ref 6.0–8.3)

## 2011-11-13 LAB — CBC WITH DIFFERENTIAL/PLATELET
Basophils Relative: 0.4 % (ref 0.0–3.0)
Eosinophils Absolute: 0.1 10*3/uL (ref 0.0–0.7)
HCT: 40.6 % (ref 39.0–52.0)
Hemoglobin: 13.1 g/dL (ref 13.0–17.0)
Lymphocytes Relative: 25.5 % (ref 12.0–46.0)
Lymphs Abs: 1.9 10*3/uL (ref 0.7–4.0)
MCHC: 32.1 g/dL (ref 30.0–36.0)
Monocytes Relative: 6.1 % (ref 3.0–12.0)
Neutro Abs: 5 10*3/uL (ref 1.4–7.7)
RBC: 4.11 Mil/uL — ABNORMAL LOW (ref 4.22–5.81)
RDW: 14.2 % (ref 11.5–14.6)

## 2011-11-13 LAB — POCT URINALYSIS DIPSTICK
Bilirubin, UA: NEGATIVE
Ketones, UA: NEGATIVE
Spec Grav, UA: <=1.005

## 2011-11-13 LAB — BASIC METABOLIC PANEL
CO2: 28 mEq/L (ref 19–32)
Calcium: 9.2 mg/dL (ref 8.4–10.5)
Chloride: 104 mEq/L (ref 96–112)
Glucose, Bld: 77 mg/dL (ref 70–99)
Potassium: 3.8 mEq/L (ref 3.5–5.1)
Sodium: 140 mEq/L (ref 135–145)

## 2011-11-13 LAB — LIPID PANEL: Cholesterol: 156 mg/dL (ref 0–200)

## 2011-11-13 LAB — PSA: PSA: 3.14 ng/mL (ref 0.10–4.00)

## 2011-11-13 MED ORDER — CLOPIDOGREL BISULFATE 75 MG PO TABS: 75.0000 mg | ORAL_TABLET | Freq: Every day | ORAL | Status: AC

## 2011-11-13 MED ORDER — ATORVASTATIN CALCIUM 20 MG PO TABS: 20.0000 mg | ORAL_TABLET | Freq: Every day | ORAL | Status: DC

## 2011-11-13 MED ORDER — ALENDRONATE SODIUM 70 MG PO TABS: 70.0000 mg | ORAL_TABLET | ORAL | Status: DC

## 2011-11-13 NOTE — Assessment & Plan Note (Signed)
F/u vascular--  If they are ok with [place] following [place] we can take over next year

## 2011-11-13 NOTE — Assessment & Plan Note (Signed)
Per u rology 

## 2011-11-13 NOTE — Patient Instructions (Signed)
Preventive Care for Adults, Male A healthy lifestyle and preventative care can promote health and wellness. Preventative health guidelines for men include the following key practices:  A routine yearly physical is a good way to check with your caregiver about your health and preventative screening. It is a chance to share any concerns and updates on your health, and to receive a thorough exam.   Visit your dentist for a routine exam and preventative care every 6 months. Brush your teeth twice a day and floss once a day. Good oral hygiene prevents tooth decay and gum disease.   The frequency of eye exams is based on your age, health, family medical history, use of contact lenses, and other factors. Follow your caregiver's recommendations for frequency of eye exams.   Eat a healthy diet. Foods like vegetables, fruits, whole grains, low-fat dairy products, and lean protein foods contain the nutrients you need without too many calories. Decrease your intake of foods high in solid fats, added sugars, and salt. Eat the right amount of calories for you.Get information about a proper diet from your caregiver, if necessary.   Regular physical exercise is one of the most important things you can do for your health. Most adults should get at least 150 minutes of moderate-intensity exercise (any activity that increases your heart rate and causes you to sweat) each week. In addition, most adults need muscle-strengthening exercises on 2 or more days a week.   Maintain a healthy weight. The body mass index (BMI) is a screening tool to identify possible weight problems. It provides an estimate of body fat based on height and weight. Your caregiver can help determine your BMI, and can help you achieve or maintain a healthy weight.For adults 20 years and older:   A BMI below 18.5 is considered underweight.   A BMI of 18.5 to 24.9 is normal.   A BMI of 25 to 29.9 is considered overweight.   A BMI of 30 and above  is considered obese.   Maintain normal blood lipids and cholesterol levels by exercising and minimizing your intake of saturated fat. Eat a balanced diet with plenty of fruit and vegetables. Blood tests for lipids and cholesterol should begin at age 20 and be repeated every 5 years. If your lipid or cholesterol levels are high, you are over 50, or you are a high risk for heart disease, you may need your cholesterol levels checked more frequently.Ongoing high lipid and cholesterol levels should be treated with medicines if diet and exercise are not effective.   If you smoke, find out from your caregiver how to quit. If you do not use tobacco, do not start.   If you choose to drink alcohol, do not exceed 2 drinks per day. One drink is considered to be 12 ounces (355 mL) of beer, 5 ounces (148 mL) of wine, or 1.5 ounces (44 mL) of liquor.   Avoid use of street drugs. Do not share needles with anyone. Ask for help if you need support or instructions about stopping the use of drugs.   High blood pressure causes heart disease and increases the risk of stroke. Your blood pressure should be checked at least every 1 to 2 years. Ongoing high blood pressure should be treated with medicines, if weight loss and exercise are not effective.   If you are 45 to 76 years old, ask your caregiver if you should take aspirin to prevent heart disease.   Diabetes screening involves taking a blood   sample to check your fasting blood sugar level. This should be done once every 3 years, after age 45, if you are within normal weight and without risk factors for diabetes. Testing should be considered at a younger age or be carried out more frequently if you are overweight and have at least 1 risk factor for diabetes.   Colorectal cancer can be detected and often prevented. Most routine colorectal cancer screening begins at the age of 50 and continues through age 75. However, your caregiver may recommend screening at an earlier  age if you have risk factors for colon cancer. On a yearly basis, your caregiver may provide home test kits to check for hidden blood in the stool. Use of a small camera at the end of a tube, to directly examine the colon (sigmoidoscopy or colonoscopy), can detect the earliest forms of colorectal cancer. Talk to your caregiver about this at age 50, when routine screening begins. Direct examination of the colon should be repeated every 5 to 10 years through age 75, unless early forms of pre-cancerous polyps or small growths are found.   Hepatitis C blood testing is recommended for all people born from 1945 through 1965 and any individual with known risks for hepatitis C.   Practice safe sex. Use condoms and avoid high-risk sexual practices to reduce the spread of sexually transmitted infections (STIs). STIs include gonorrhea, chlamydia, syphilis, trichomonas, herpes, HPV, and human immunodeficiency virus (HIV). Herpes, HIV, and HPV are viral illnesses that have no cure. They can result in disability, cancer, and death.   A one-time screening for abdominal aortic aneurysm (AAA) and surgical repair of large AAAs by sound wave imaging (ultrasonography) is recommended for ages 65 to 75 years who are current or former smokers.   Healthy men should no longer receive prostate-specific antigen (PSA) blood tests as part of routine cancer screening. Consult with your caregiver about prostate cancer screening.   Testicular cancer screening is not recommended for adult males who have no symptoms. Screening includes self-exam, caregiver exam, and other screening tests. Consult with your caregiver about any symptoms you have or any concerns you have about testicular cancer.   Use sunscreen with skin protection factor (SPF) of 30 or more. Apply sunscreen liberally and repeatedly throughout the day. You should seek shade when your shadow is shorter than you. Protect yourself by wearing long sleeves, pants, a  wide-brimmed hat, and sunglasses year round, whenever you are outdoors.   Once a month, do a whole body skin exam, using a mirror to look at the skin on your back. Notify your caregiver of new moles, moles that have irregular borders, moles that are larger than a pencil eraser, or moles that have changed in shape or color.   Stay current with required immunizations.   Influenza. You need a dose every fall (or winter). The composition of the flu vaccine changes each year, so being vaccinated once is not enough.   Pneumococcal polysaccharide. You need 1 to 2 doses if you smoke cigarettes or if you have certain chronic medical conditions. You need 1 dose at age 65 (or older) if you have never been vaccinated.   Tetanus, diphtheria, pertussis (Tdap, Td). Get 1 dose of Tdap vaccine if you are younger than age 65 years, are over 65 and have contact with an infant, are a healthcare worker, or simply want to be protected from whooping cough. After that, you need a Td booster dose every 10 years. Consult your caregiver if   you have not had at least 3 tetanus and diphtheria-containing shots sometime in your life or have a deep or dirty wound.   HPV. This vaccine is recommended for males 13 through 76 years of age. This vaccine may be given to men 22 through 76 years of age who have not completed the 3 dose series. It is recommended for men through age 26 who have sex with men or whose immune system is weakened because of HIV infection, other illness, or medications. The vaccine is given in 3 doses over 6 months.   Measles, mumps, rubella (MMR). You need at least 1 dose of MMR if you were born in 1957 or later. You may also need a 2nd dose.   Meningococcal. If you are age 19 to 21 years and a first-year college student living in a residence hall, or have one of several medical conditions, you need to get vaccinated against meningococcal disease. You may also need additional booster doses.   Zoster (shingles).  If you are age 60 years or older, you should get this vaccine.   Varicella (chickenpox). If you have never had chickenpox or you were vaccinated but received only 1 dose, talk to your caregiver to find out if you need this vaccine.   Hepatitis A. You need this vaccine if you have a specific risk factor for hepatitis A virus infection, or you simply wish to be protected from this disease. The vaccine is usually given as 2 doses, 6 to 18 months apart.   Hepatitis B. You need this vaccine if you have a specific risk factor for hepatitis B virus infection or you simply wish to be protected from this disease. The vaccine is given in 3 doses, usually over 6 months.  Preventative Service / Frequency Ages 19 to 39  Blood pressure check.** / Every 1 to 2 years.   Lipid and cholesterol check.** / Every 5 years beginning at age 20.   Hepatitis C blood test.** / For any individual with known risks for hepatitis C.   Skin self-exam. / Monthly.   Influenza immunization.** / Every year.   Pneumococcal polysaccharide immunization.** / 1 to 2 doses if you smoke cigarettes or if you have certain chronic medical conditions.   Tetanus, diphtheria, pertussis (Tdap,Td) immunization. / A one-time dose of Tdap vaccine. After that, you need a Td booster dose every 10 years.   HPV immunization. / 3 doses over 6 months, if 26 and younger.   Measles, mumps, rubella (MMR) immunization. / You need at least 1 dose of MMR if you were born in 1957 or later. You may also need a 2nd dose.   Meningococcal immunization. / 1 dose if you are age 19 to 21 years and a first-year college student living in a residence hall, or have one of several medical conditions, you need to get vaccinated against meningococcal disease. You may also need additional booster doses.   Varicella immunization.** / Consult your caregiver.   Hepatitis A immunization.** / Consult your caregiver. 2 doses, 6 to 18 months apart.   Hepatitis B  immunization.** / Consult your caregiver. 3 doses usually over 6 months.  Ages 40 to 64  Blood pressure check.** / Every 1 to 2 years.   Lipid and cholesterol check.** / Every 5 years beginning at age 20.   Fecal occult blood test (FOBT) of stool. / Every year beginning at age 50 and continuing until age 75. You may not have to do this test if   you get colonoscopy every 10 years.   Flexible sigmoidoscopy** or colonoscopy.** / Every 5 years for a flexible sigmoidoscopy or every 10 years for a colonoscopy beginning at age 50 and continuing until age 75.   Hepatitis C blood test.** / For all people born from 1945 through 1965 and any individual with known risks for hepatitis C.   Skin self-exam. / Monthly.   Influenza immunization.** / Every year.   Pneumococcal polysaccharide immunization.** / 1 to 2 doses if you smoke cigarettes or if you have certain chronic medical conditions.   Tetanus, diphtheria, pertussis (Tdap/Td) immunization.** / A one-time dose of Tdap vaccine. After that, you need a Td booster dose every 10 years.   Measles, mumps, rubella (MMR) immunization. / You need at least 1 dose of MMR if you were born in 1957 or later. You may also need a 2nd dose.   Varicella immunization.**/ Consult your caregiver.   Meningococcal immunization.** / Consult your caregiver.   Hepatitis A immunization.** / Consult your caregiver. 2 doses, 6 to 18 months apart.   Hepatitis B immunization.** / Consult your caregiver. 3 doses, usually over 6 months.  Ages 65 and over  Blood pressure check.** / Every 1 to 2 years.   Lipid and cholesterol check.**/ Every 5 years beginning at age 20.   Fecal occult blood test (FOBT) of stool. / Every year beginning at age 50 and continuing until age 75. You may not have to do this test if you get colonoscopy every 10 years.   Flexible sigmoidoscopy** or colonoscopy.** / Every 5 years for a flexible sigmoidoscopy or every 10 years for a colonoscopy  beginning at age 50 and continuing until age 75.   Hepatitis C blood test.** / For all people born from 1945 through 1965 and any individual with known risks for hepatitis C.   Abdominal aortic aneurysm (AAA) screening.** / A one-time screening for ages 65 to 75 years who are current or former smokers.   Skin self-exam. / Monthly.   Influenza immunization.** / Every year.   Pneumococcal polysaccharide immunization.** / 1 dose at age 65 (or older) if you have never been vaccinated.   Tetanus, diphtheria, pertussis (Tdap, Td) immunization. / A one-time dose of Tdap vaccine if you are over 65 and have contact with an infant, are a healthcare worker, or simply want to be protected from whooping cough. After that, you need a Td booster dose every 10 years.   Varicella immunization. ** / Consult your caregiver.   Meningococcal immunization.** / Consult your caregiver.   Hepatitis A immunization. ** / Consult your caregiver. 2 doses, 6 to 18 months apart.   Hepatitis B immunization.** / Check with your caregiver. 3 doses, usually over 6 months.  **Family history and personal history of risk and conditions may change your caregiver's recommendations. Document Released: 04/11/2001 Document Revised: 02/02/2011 Document Reviewed: 07/11/2010 [employer] Patient Information 2012 [employer]. 

## 2011-11-13 NOTE — Progress Notes (Signed)
Subjective:    Rick [name] is a 78 y.o. male who presents for Medicare Annual/Subsequent preventive examination.   Preventive Screening-Counseling & Management  Tobacco History  Smoking status  . Current Every Day Smoker -- 0.5 packs/day for 50 years  . Types: Cigarettes  Smokeless tobacco  . Not on file    Problems Prior to Visit 1.   Current Problems (verified) Patient Active Problem List  Diagnosis  . TESTOSTERONE DEFICIENCY  . HYPERLIPIDEMIA NEC/NOS  . ERECTILE DYSFUNCTION  . TOBACCO USER  . HYPERTENSION  . CAROTID STENOSIS  . UTI  . MICROSCOPIC HEMATURIA  . BACK PAIN, ACUTE  . OSTEOPOROSIS  . OSTEOPENIA  . RIB PAIN, RIGHT SIDED  . KNEE SPRAIN, LEFT, MEDIAL COLLATERAL LIGAMENT  . COLONIC POLYPS, HX OF  . BENIGN PROSTATIC HYPERTROPHY, HX OF  . Carotid bruit  . Preventative health care    Medications Prior to Visit Current Outpatient Prescriptions on File Prior to Visit  Medication Sig Dispense Refill  . Calcium-Vitamin D (CALTRATE 600 PLUS-VIT D PO) Take 600 mg by mouth 2 (two) times daily.       . MULTIPLE VITAMIN PO Take by mouth.        . Saw Palmetto 450 MG CAPS Take by mouth.        . sildenafil (VIAGRA) 100 MG tablet Take 100 mg by mouth daily as needed.        Marland Kitchen VITAMIN D, CHOLECALCIFEROL, PO Take by mouth.        . DISCONTD: alendronate (FOSAMAX) 70 MG tablet Take 1 tablet (70 mg total) by mouth every 7 (seven) days. Take with a full glass of water on an empty stomach.  12 tablet  3  . atorvastatin (LIPITOR) 20 MG tablet Take 1 tablet (20 mg total) by mouth daily.  90 tablet  3    Current Medications (verified) Current Outpatient Prescriptions  Medication Sig Dispense Refill  . alendronate (FOSAMAX) 70 MG tablet Take 1 tablet (70 mg total) by mouth every 7 (seven) days. Take with a full glass of water on an empty stomach.  12 tablet  3  . Calcium-Vitamin D (CALTRATE 600 PLUS-VIT D  PO) Take 600 mg by mouth 2 (two) times daily.       . clopidogrel (PLAVIX) 75 MG tablet Take 1 tablet (75 mg total) by mouth daily.  90 tablet  3  . cyanocobalamin 100 MCG tablet Take 100 mcg by mouth daily.      . MULTIPLE VITAMIN PO Take by mouth.        . Saw Palmetto 450 MG CAPS Take by mouth.        . sildenafil (VIAGRA) 100 MG tablet Take 100 mg by mouth daily as needed.        Marland Kitchen VITAMIN D, CHOLECALCIFEROL, PO Take by mouth.        . DISCONTD: alendronate (FOSAMAX) 70 MG tablet Take 1 tablet (70 mg total) by mouth every 7 (seven) days. Take with a full glass of water on an empty stomach.  12 tablet  3  . atorvastatin (LIPITOR) 20 MG tablet Take 1 tablet (20 mg total) by mouth daily.  90 tablet  3     Allergies (verified) Aspirin and Penicillins   PAST HISTORY  Family History Family History  Problem Relation Age of Onset  . Stroke Mother   . Seizures Father     Social History History  Substance Use Topics  . Smoking status: Current Every Day Smoker -- 0.5 packs/day for 50 years    Types: Cigarettes  . Smokeless tobacco: Not on file  . Alcohol Use: 1.2 oz/week    2 Shots of liquor per week    Are there smokers in your home (other than you)?  No  Risk Factors Current exercise habits: walks 45 min for 6 days a week Dietary issues discussed: na   Cardiac risk factors: advanced age (older than 56 for men, 25 for women), dyslipidemia, hypertension and male gender.  Depression Screen (Note: if answer to either of the following is "Yes", a more complete depression screening is indicated)   Q1: Over the past two weeks, have you felt down, depressed or hopeless? No  Q2: Over the past two weeks, have you felt little interest or pleasure in doing things? No  Have you lost interest or pleasure in daily life? No  Do you often feel hopeless? No  Do you cry easily over simple problems? No  Activities of Daily Living In your present state of health, do you have any difficulty  performing the following activities?:  Driving? No Managing money?  No Feeding yourself? No Getting from bed to chair? No Climbing a flight of stairs? No Preparing food and eating?: No Bathing or showering? No Getting dressed: No Getting to the toilet? No Using the toilet:No Moving around from place to place: No In the past year have you fallen or had a near fall?:No   Are you sexually active?  Yes  Do you have more than one partner?  No  Hearing Difficulties: No Do you often ask people to speak up or repeat themselves? No Do you experience ringing or noises in your ears? No Do you have difficulty understanding soft or whispered voices? No   Do you feel that you have a problem with memory? No  Do you often misplace items? No  Do you feel safe at home?  No  Cognitive Testing  Alert? Yes  Normal Appearance?Yes  Oriented to person? Yes  Place? Yes   Time? Yes  Recall of three objects?  Yes  Can perform simple calculations? Yes  Displays appropriate judgment?Yes  Can read the correct time from a watch face?Yes   Advanced Directives have been discussed with the patient? Yes   List the Names of Other Physician/Practitioners you currently use: 1.  GI --patterson 2.  Vascular--Lawson 3.  Derm-- gould 4 urology-- sural 4.eye--[place]by 5 dentist-- benson Indicate any recent Medical Services you may have received from other than [facility] providers in the past year (date may be approximate).  Immunization History  Administered Date(s) Administered  . Influenza Whole 11/28/1998, 12/25/2006, 12/09/2008, 11/15/2009, 11/14/2010  . Pneumococcal Polysaccharide 05/22/2002  . Td 12/01/1996, 03/15/2005  . Zoster 09/13/2005, 03/15/2006, 05/29/2006    Screening Tests Health Maintenance  Topic Date Due  . Influenza Vaccine  11/28/2011  . Tetanus/tdap  03/16/2015  . Colonoscopy  09/24/2018  . Pneumococcal Polysaccharide Vaccine Age 83 And Over  Completed  . Zostavax  Completed    All  answers were reviewed with the patient and necessary referrals were made:  Loreen Freud, DO   11/13/2011   History reviewed: allergies, current medications, past family history, past medical history, past social history, past surgical history and problem list  Review of Systems  Review  of Systems  Constitutional: Negative for activity change, appetite change and fatigue.  HENT: Negative for hearing loss, congestion, tinnitus and ear discharge.   Eyes: Negative for visual disturbance (see optho q1y -- vision corrected to 20/20 with glasses).  Respiratory: Negative for cough, chest tightness and shortness of breath.   Cardiovascular: Negative for chest pain, palpitations and leg swelling.  Gastrointestinal: Negative for abdominal pain, diarrhea, constipation and abdominal distention.  Genitourinary: Negative for urgency, frequency, decreased urine volume and difficulty urinating.  Musculoskeletal: Negative for back pain, arthralgias and gait problem.  Skin: Negative for color change, pallor and rash.  Neurological: Negative for dizziness, light-headedness, numbness and headaches.  Hematological: Negative for adenopathy. Does not bruise/bleed easily.  Psychiatric/Behavioral: Negative for suicidal ideas, confusion, sleep disturbance, self-injury, dysphoric mood, decreased concentration and agitation.  Pt is able to read and write and can do all ADLs No risk for falling No abuse/ violence in home      Objective:     Vision by Snellen chart: opth Blood pressure 116/62, pulse 61, temperature 97.8 F (36.6 C), temperature source Oral, height 6' (1.829 m), weight 130 lb 9.6 oz (59.24 kg), SpO2 97.00%. Body mass index is 17.71 kg/(m^2).  BP 116/62  Pulse 61  Temp 97.8 F (36.6 C) (Oral)  Ht 6' (1.829 m)  Wt 130 lb 9.6 oz (59.24 kg)  BMI 17.71 kg/m2  SpO2 97% General appearance: alert, cooperative, appears stated age and no distress Head: Normocephalic, without obvious abnormality,  atraumatic Eyes: conjunctivae/corneas clear. PERRL, EOM's intact. Fundi benign. Ears: normal TM's and external ear canals both ears Nose: Nares normal. Septum midline. Mucosa normal. No drainage or sinus tenderness. Throat: lips, mucosa, and tongue normal; teeth and gums normal Neck: no adenopathy, supple, symmetrical, trachea midline, thyroid not enlarged, symmetric, no tenderness/mass/nodules and carotid bruit Left Back: symmetric, no curvature. ROM normal. No CVA tenderness. Lungs: clear to auscultation bilaterally Chest wall: no tenderness Heart: regular rate and rhythm, S1, S2 normal, no murmur, click, rub or gallop Abdomen: soft, non-tender; bowel sounds normal; no masses,  no organomegaly Male genitalia: deferredurology Rectal: deferred--urology Extremities: extremities normal, atraumatic, no cyanosis or edema Pulses: 2+ and symmetric Skin: Skin color, texture, turgor normal. No rashes or lesions Lymph nodes: Cervical, supraclavicular, and axillary nodes normal. Neurologic: Alert and oriented X 3, normal strength and tone. Normal symmetric reflexes. Normal coordination and gait Psych---no depression, no anxiety     Assessment:     cpe   Plan:     During the course of the visit the patient was educated and counseled about appropriate screening and preventive services including:    Pneumococcal vaccine   Influenza vaccine  Hepatitis B vaccine  Td vaccine  Prostate cancer screening  Colorectal cancer screening  Diabetes screening  Glaucoma screening  Nutrition counseling   Advanced directives: has an advanced directive - a copy HAS NOT been provided.  Diet review for nutrition referral? Yes ____  Not Indicated _x___   Patient Instructions (the written plan) was given to the patient.  Medicare Attestation I have personally reviewed: The patient's medical and social history Their use of alcohol, tobacco or illicit drugs Their current medications and  supplements The patient's functional ability including ADLs,fall risks, home safety risks, cognitive, and hearing and visual impairment Diet and physical activities Evidence for depression or mood disorders  The patient's weight, height, BMI, and visual acuity have been recorded in the chart.  I have made referrals, counseling, and provided education to the patient  based on review of the above and I have provided the patient with a written personalized care plan for preventive services.     Loreen Freud, DO   11/13/2011

## 2011-11-13 NOTE — Telephone Encounter (Signed)
Sent for GI info to be scanned; I see no records of appts.

## 2011-11-15 LAB — URINE CULTURE
Colony Count: NO GROWTH
Organism ID, Bacteria: NO GROWTH

## 2011-11-15 NOTE — Telephone Encounter (Signed)
Pt had previous COLONs by Dr Doreatha [name], last one 08/25/2004 for hx of adenomatous polyps. The COLON was normal except for diverticulosis and he recommended recall in 7 years. Pt denies any problems and wasn't ordered any hemoccult cards by Dr Laury Axon, probably because she sent [place] this message. Does pt need a COLON this year at his age, 16? Thanks.

## 2011-11-15 NOTE — Telephone Encounter (Signed)
Not needed if IFOB are negative

## 2011-11-16 ENCOUNTER — Other Ambulatory Visit: Payer: Self-pay | Admitting: *Deleted

## 2011-11-16 ENCOUNTER — Encounter: Payer: Self-pay | Admitting: Vascular Surgery

## 2011-11-16 DIAGNOSIS — I6529 Occlusion and stenosis of unspecified carotid artery: Secondary | ICD-10-CM

## 2011-11-16 NOTE — Telephone Encounter (Signed)
lmom for pt to call back. IFOB lab in for pt and reminder.

## 2011-11-16 NOTE — Telephone Encounter (Signed)
Informed pt to come by and pick up IFOB cards and depending on the results he may not need a repeat COLON. Pt stated understanding.

## 2011-11-16 NOTE — Telephone Encounter (Signed)
noted 

## 2011-11-17 ENCOUNTER — Other Ambulatory Visit (INDEPENDENT_AMBULATORY_CARE_PROVIDER_SITE_OTHER): Payer: Medicare Other | Admitting: *Deleted

## 2011-11-17 ENCOUNTER — Encounter: Payer: Self-pay | Admitting: Neurosurgery

## 2011-11-17 ENCOUNTER — Ambulatory Visit (INDEPENDENT_AMBULATORY_CARE_PROVIDER_SITE_OTHER): Payer: Medicare Other | Admitting: Neurosurgery

## 2011-11-17 VITALS — BP 117/62 | HR 57 | Resp 16 | Ht 73.0 in | Wt 129.2 lb

## 2011-11-17 DIAGNOSIS — I6529 Occlusion and stenosis of unspecified carotid artery: Secondary | ICD-10-CM

## 2011-11-17 NOTE — Progress Notes (Signed)
[facility]ot[facility] Office Note  CC: Annual carotid surveillance Referring Physician: Hart Rochester  History of Present Illness: 78 year old male patient of Dr. Hart Rochester with known carotid stenosis and no history of intervention. The patient denies any signs or symptoms of CVA, TIA, amaurosis fugax or any neural deficit. The patient also denies any new medical diagnoses or recent surgery.  Past Medical History  Diagnosis Date  . Hx of colonic polyps   . Hypertension   . Kidney stones   . Chronic ear infection     left  . Hyperlipidemia   . BPH (benign prostatic hyperplasia)   . Osteoporosis   . Carotid artery occlusion     ROS: [x]  Positive   [ ]  Denies    General: [ ]  Weight loss, [ ]  Fever, [ ]  chills Neurologic: [ ]  Dizziness, [ ]  Blackouts, [ ]  Seizure [ ]  Stroke, [ ]  "Mini stroke", [ ]  Slurred speech, [ ]  Temporary blindness; [ ]  weakness in arms or legs, [ ]  Hoarseness Cardiac: [ ]  Chest pain/pressure, [ ]  Shortness of breath at rest [ ]  Shortness of breath with exertion, [ ]  Atrial fibrillation or irregular heartbeat Vascular: [ ]  Pain in legs with walking, [ ]  Pain in legs at rest, [ ]  Pain in legs at night,  [ ]  Non-healing ulcer, [ ]  Blood clot in vein/DVT,   Pulmonary: [ ]  Home oxygen, [ ]  Productive cough, [ ]  Coughing up blood, [ ]  Asthma,  [ ]  Wheezing Musculoskeletal:  [ ]  Arthritis, [ ]  Low back pain, [ ]  Joint pain Hematologic: [ ]  Easy Bruising, [ ]  Anemia; [ ]  Hepatitis Gastrointestinal: [ ]  Blood in stool, [ ]  Gastroesophageal Reflux/heartburn, [ ]  Trouble swallowing Urinary: [ ]  chronic Kidney disease, [ ]  on HD - [ ]  MWF or [ ]  TTHS, [ ]  Burning with urination, [ ]  Difficulty urinating Skin: [ ]  Rashes, [ ]  Wounds Psychological: [ ]  Anxiety, [ ]  Depression   Social History History  Substance Use Topics  . Smoking status: Current Every Day Smoker -- 0.5 packs/day for 50 years    Types: Cigarettes  . Smokeless tobacco: Not on file    . Alcohol Use: 1.2 oz/week    2 Shots of liquor per week    Family History Family History  Problem Relation Age of Onset  . Stroke Mother   . Seizures Father     Allergies  Allergen Reactions  . Aspirin Hives and Itching  . Penicillins Hives and Itching    Current Outpatient Prescriptions  Medication Sig Dispense Refill  . alendronate (FOSAMAX) 70 MG tablet Take 1 tablet (70 mg total) by mouth every 7 (seven) days. Take with a full glass of water on an empty stomach.  12 tablet  3  . atorvastatin (LIPITOR) 20 MG tablet Take 1 tablet (20 mg total) by mouth daily.  90 tablet  3  . Calcium-Vitamin D (CALTRATE 600 PLUS-VIT D PO) Take 600 mg by mouth 2 (two) times daily.       . clopidogrel (PLAVIX) 75 MG tablet Take 1 tablet (75 mg total) by mouth daily.  90 tablet  3  . cyanocobalamin 100 MCG tablet Take 100 mcg by mouth daily.      . MULTIPLE VITAMIN PO Take by mouth.        . Saw Palmetto 450 MG CAPS Take by mouth.        . sildenafil (VIAGRA) 100 MG tablet Take 100  mg by mouth daily as needed.        Marland Kitchen VITAMIN D, CHOLECALCIFEROL, PO Take by mouth.          Physical Examination  Filed Vitals:   11/17/11 1519  BP: 117/62  Pulse: 57  Resp:     Body mass index is 17.05 kg/(m^2).  General:  WDWN in NAD Gait: Normal HEENT: WNL Eyes: Pupils equal Pulmonary: normal non-labored breathing , without Rales, rhonchi,  wheezing Cardiac: RRR, without  Murmurs, rubs or gallops; Abdomen: soft, NT, no masses Skin: no rashes, ulcers noted  Vascular Exam Pulses: 3+ radial pulses bilaterally Carotid bruits: Carotid pulse to auscultation on the right with a mild bruit heard on the left Extremities without ischemic changes, no Gangrene , no cellulitis; no open wounds;  Musculoskeletal: no muscle wasting or atrophy   Neurologic: A&O X 3; Appropriate Affect ; SENSATION: normal; MOTOR FUNCTION:  moving all extremities equally. Speech is fluent/normal  Non-Invasive Vascular  Imaging CAROTID DUPLEX 11/17/2011  Right ICA 20 - 39 % stenosis Left ICA 60 - 79 % stenosis   ASSESSMENT/PLAN: Asymptomatic patient with stable bilateral ICA stenosis. The patient will followup here in 6 months for repeat carotid duplex, his questions were encouraged and answered, he is in agreement with this plan.  Lauree Chandler ANP   Clinic MD: June

## 2011-11-20 DIAGNOSIS — Z23 Encounter for immunization: Secondary | ICD-10-CM | POA: Diagnosis not present

## 2011-11-20 NOTE — Addendum Note (Signed)
Addended by: Dannielle Karvonen on: 11/20/2011 09:54 AM   Modules accepted: Orders

## 2011-12-05 ENCOUNTER — Other Ambulatory Visit: Payer: Medicare Other

## 2011-12-05 DIAGNOSIS — Z86010 Personal history of colonic polyps: Secondary | ICD-10-CM

## 2011-12-05 DIAGNOSIS — Z860101 Personal history of adenomatous and serrated colon polyps: Secondary | ICD-10-CM

## 2011-12-06 ENCOUNTER — Other Ambulatory Visit: Payer: Self-pay | Admitting: Dermatology

## 2011-12-06 DIAGNOSIS — L570 Actinic keratosis: Secondary | ICD-10-CM | POA: Diagnosis not present

## 2011-12-06 DIAGNOSIS — D485 Neoplasm of uncertain behavior of skin: Secondary | ICD-10-CM | POA: Diagnosis not present

## 2011-12-06 DIAGNOSIS — D0420 Carcinoma in situ of skin of unspecified ear and external auricular canal: Secondary | ICD-10-CM | POA: Diagnosis not present

## 2011-12-06 DIAGNOSIS — L821 Other seborrheic keratosis: Secondary | ICD-10-CM | POA: Diagnosis not present

## 2011-12-21 DIAGNOSIS — N401 Enlarged prostate with lower urinary tract symptoms: Secondary | ICD-10-CM | POA: Diagnosis not present

## 2011-12-26 DIAGNOSIS — C4499 Other specified malignant neoplasm of skin, unspecified: Secondary | ICD-10-CM | POA: Diagnosis not present

## 2012-02-06 DIAGNOSIS — H903 Sensorineural hearing loss, bilateral: Secondary | ICD-10-CM | POA: Diagnosis not present

## 2012-02-06 DIAGNOSIS — H7200 Central perforation of tympanic membrane, unspecified ear: Secondary | ICD-10-CM | POA: Diagnosis not present

## 2012-02-09 ENCOUNTER — Emergency Department (HOSPITAL_BASED_OUTPATIENT_CLINIC_OR_DEPARTMENT_OTHER): Payer: Medicare Other

## 2012-02-09 ENCOUNTER — Encounter (HOSPITAL_BASED_OUTPATIENT_CLINIC_OR_DEPARTMENT_OTHER): Payer: Self-pay

## 2012-02-09 ENCOUNTER — Emergency Department (HOSPITAL_BASED_OUTPATIENT_CLINIC_OR_DEPARTMENT_OTHER)
Admission: EM | Admit: 2012-02-09 | Discharge: 2012-02-09 | Disposition: A | Discharge status: Home / Self Care | Payer: Medicare Other | Attending: Emergency Medicine | Admitting: Emergency Medicine

## 2012-02-09 DIAGNOSIS — S0180XA Unspecified open wound of other part of head, initial encounter: Secondary | ICD-10-CM | POA: Insufficient documentation

## 2012-02-09 DIAGNOSIS — Z860100 Personal history of colon polyps, unspecified: Secondary | ICD-10-CM | POA: Insufficient documentation

## 2012-02-09 DIAGNOSIS — Z87442 Personal history of urinary calculi: Secondary | ICD-10-CM | POA: Insufficient documentation

## 2012-02-09 DIAGNOSIS — S93409A Sprain of unspecified ligament of unspecified ankle, initial encounter: Secondary | ICD-10-CM | POA: Diagnosis not present

## 2012-02-09 DIAGNOSIS — Z8679 Personal history of other diseases of the circulatory system: Secondary | ICD-10-CM | POA: Insufficient documentation

## 2012-02-09 DIAGNOSIS — Z7902 Long term (current) use of antithrombotics/antiplatelets: Secondary | ICD-10-CM | POA: Diagnosis not present

## 2012-02-09 DIAGNOSIS — S93401A Sprain of unspecified ligament of right ankle, initial encounter: Secondary | ICD-10-CM

## 2012-02-09 DIAGNOSIS — N400 Enlarged prostate without lower urinary tract symptoms: Secondary | ICD-10-CM | POA: Diagnosis not present

## 2012-02-09 DIAGNOSIS — M810 Age-related osteoporosis without current pathological fracture: Secondary | ICD-10-CM | POA: Insufficient documentation

## 2012-02-09 DIAGNOSIS — I10 Essential (primary) hypertension: Secondary | ICD-10-CM | POA: Diagnosis not present

## 2012-02-09 DIAGNOSIS — Z79899 Other long term (current) drug therapy: Secondary | ICD-10-CM | POA: Insufficient documentation

## 2012-02-09 DIAGNOSIS — E785 Hyperlipidemia, unspecified: Secondary | ICD-10-CM | POA: Insufficient documentation

## 2012-02-09 DIAGNOSIS — S0181XA Laceration without foreign body of other part of head, initial encounter: Secondary | ICD-10-CM

## 2012-02-09 DIAGNOSIS — S52599A Other fractures of lower end of unspecified radius, initial encounter for closed fracture: Secondary | ICD-10-CM | POA: Insufficient documentation

## 2012-02-09 DIAGNOSIS — F17200 Nicotine dependence, unspecified, uncomplicated: Secondary | ICD-10-CM | POA: Insufficient documentation

## 2012-02-09 DIAGNOSIS — S0190XA Unspecified open wound of unspecified part of head, initial encounter: Secondary | ICD-10-CM | POA: Diagnosis not present

## 2012-02-09 DIAGNOSIS — S8990XA Unspecified injury of unspecified lower leg, initial encounter: Secondary | ICD-10-CM | POA: Diagnosis not present

## 2012-02-09 DIAGNOSIS — H6690 Otitis media, unspecified, unspecified ear: Secondary | ICD-10-CM | POA: Insufficient documentation

## 2012-02-09 DIAGNOSIS — Y9301 Activity, walking, marching and hiking: Secondary | ICD-10-CM | POA: Insufficient documentation

## 2012-02-09 DIAGNOSIS — W1789XA Other fall from one level to another, initial encounter: Secondary | ICD-10-CM | POA: Insufficient documentation

## 2012-02-09 DIAGNOSIS — S99929A Unspecified injury of unspecified foot, initial encounter: Secondary | ICD-10-CM | POA: Diagnosis not present

## 2012-02-09 DIAGNOSIS — Y929 Unspecified place or not applicable: Secondary | ICD-10-CM | POA: Insufficient documentation

## 2012-02-09 DIAGNOSIS — S52501A Unspecified fracture of the lower end of right radius, initial encounter for closed fracture: Secondary | ICD-10-CM

## 2012-02-09 DIAGNOSIS — M25579 Pain in unspecified ankle and joints of unspecified foot: Secondary | ICD-10-CM | POA: Diagnosis not present

## 2012-02-09 MED ORDER — HYDROCODONE-ACETAMINOPHEN 5-325 MG PO TABS: 1.0000 | ORAL_TABLET | Freq: Four times a day (QID) | ORAL | Status: DC | PRN

## 2012-02-09 NOTE — ED Notes (Signed)
rx x 1 for hydrocodone at pharmacy picked up by pt's wife- ice packs given for home use- pt has family here to drive

## 2012-02-09 NOTE — Discharge Instructions (Signed)
Ankle Sprain  An ankle sprain is an injury to the strong, fibrous tissues (ligaments) that hold the bones of your ankle joint together.   CAUSES  An ankle sprain is usually caused by a fall or by twisting your ankle. Ankle sprains most commonly occur when you step on the outer edge of your foot, and your ankle turns inward. People who participate in sports are more prone to these types of injuries.   SYMPTOMS    Pain in your ankle. The pain may be present at rest or only when you are trying to stand or walk.   Swelling.   Bruising. Bruising may develop immediately or within 1 to 2 days after your injury.   Difficulty standing or walking, particularly when turning corners or changing directions.  DIAGNOSIS   Your caregiver will ask you details about your injury and perform a physical exam of your ankle to determine if you have an ankle sprain. During the physical exam, your caregiver will press on and apply pressure to specific areas of your foot and ankle. Your caregiver will try to move your ankle in certain ways. An X-ray exam may be done to be sure a bone was not broken or a ligament did not separate from one of the bones in your ankle (avulsion fracture).   TREATMENT   Certain types of braces can help stabilize your ankle. Your caregiver can make a recommendation for this. Your caregiver may recommend the use of medicine for pain. If your sprain is severe, your caregiver may refer you to a surgeon who helps to restore function to parts of your skeletal system (orthopedist) or a physical therapist.  HOME CARE INSTRUCTIONS    Apply ice to your injury for 1 to 2 days or as directed by your caregiver. Applying ice helps to reduce inflammation and pain.   Put ice in a plastic bag.   Place a towel between your skin and the bag.   Leave the ice on for 15 to 20 minutes at a time, every 2 hours while you are awake.   Only take over-the-counter or prescription medicines for pain, discomfort, or fever as directed  by your caregiver.   Keep your injured leg elevated, when possible, to lessen swelling.   If your caregiver recommends crutches, use them as instructed. Gradually put weight on the affected ankle. Continue to use crutches or a cane until you can walk without feeling pain in your ankle.   If you have a plaster splint, wear the splint as directed by your caregiver. Do not rest it on anything harder than a pillow for the first 24 hours. Do not put weight on it. Do not get it wet. You may take it off to take a shower or bath.   You may have been given an elastic bandage to wear around your ankle to provide support. If the elastic bandage is too tight (you have numbness or tingling in your foot or your foot becomes cold and blue), adjust the bandage to make it comfortable.   If you have an air splint, you may blow more air into it or let air out to make it more comfortable. You may take your splint off at night and before taking a shower or bath.   Wiggle your toes in the splint several times per day to decrease swelling.  SEEK MEDICAL CARE IF:    You have an increase in bruising, swelling, or pain.   Your toes feel extremely cold   or you lose feeling in your foot.   Your pain is not relieved with medicine.  SEEK IMMEDIATE MEDICAL CARE IF:   Your toes are numb or blue.   You have severe pain.  MAKE SURE YOU:    Understand these instructions.   Will watch your condition.   Will get help right away if you are not doing well or get worse.  Document Released: 02/13/2005 Document Revised: 05/08/2011 Document Reviewed: 02/25/2011  [employer] Patient Information 2013 [employer]

## 2012-02-09 NOTE — ED Provider Notes (Addendum)
History     CSN: [id number]  Arrival date & time 02/09/12  1524   First MD Initiated Contact with Patient 02/09/12 1541      Chief Complaint  Patient presents with  . Fall    (Consider location/radiation/quality/duration/timing/severity/associated sxs/prior treatment) Patient is a 78 y.o. male presenting with fall. The history is provided by the patient.  Fall The accident occurred 1 to 2 hours ago. The fall occurred while walking (slipped in the garage and fell hitting his head on the step). He fell from a height of 1 to 2 ft. He landed on concrete. The volume of blood lost was minimal. The point of impact was the head and right wrist. The pain is present in the head and right wrist (right ankle). The pain is at a severity of 7/10. The pain is moderate. He was not ambulatory at the scene. Pertinent negatives include no numbness, no nausea, no headaches and no loss of consciousness. The symptoms are aggravated by activity, use of the injured limb and standing. He has tried nothing for the symptoms. The treatment provided no relief.    Past Medical History  Diagnosis Date  . Hx of colonic polyps   . Hypertension   . Kidney stones   . Chronic ear infection     left  . Hyperlipidemia   . BPH (benign prostatic hyperplasia)   . Osteoporosis   . Carotid artery occlusion     Past Surgical History  Procedure Date  . Tonsillectomy     Family History  Problem Relation Age of Onset  . Stroke Mother   . Seizures Father     History  Substance Use Topics  . Smoking status: Current Every Day Smoker -- 0.5 packs/day for 50 years    Types: Cigarettes  . Smokeless tobacco: Not on file  . Alcohol Use: 0.0 oz/week      Review of Systems  Gastrointestinal: Negative for nausea.  Neurological: Negative for loss of consciousness, numbness and headaches.  All other systems reviewed and are negative.    Allergies  Aspirin and Penicillins  Home Medications   Current Outpatient  Rx  Name  Route  Sig  Dispense  Refill  . EZETIMIBE-SIMVASTATIN 10-40 MG PO TABS   Oral   Take 1 tablet by mouth at bedtime.         . ALENDRONATE SODIUM 70 MG PO TABS   Oral   Take 1 tablet (70 mg total) by mouth every 7 (seven) days. Take with a full glass of water on an empty stomach.   12 tablet   3   . ATORVASTATIN CALCIUM 20 MG PO TABS   Oral   Take 1 tablet (20 mg total) by mouth daily.   90 tablet   3   . CALTRATE 600 PLUS-VIT D PO   Oral   Take 600 mg by mouth 2 (two) times daily.          Marland Kitchen CLOPIDOGREL BISULFATE 75 MG PO TABS   Oral   Take 1 tablet (75 mg total) by mouth daily.   90 tablet   3   . CYANOCOBALAMIN 100 MCG PO TABS   Oral   Take 100 mcg by mouth daily.         . MULTIPLE VITAMIN PO   Oral   Take by mouth.           . SAW PALMETTO 450 MG PO CAPS   Oral   Take by mouth.           Marland Kitchen  SILDENAFIL CITRATE 100 MG PO TABS   Oral   Take 100 mg by mouth daily as needed.           Marland Kitchen VITAMIN D (CHOLECALCIFEROL) PO   Oral   Take by mouth.             BP 163/85  Pulse 71  Temp 97.7 F (36.5 C) (Oral)  Resp 20  Ht 6\' 1"  (1.854 m)  Wt 135 lb (61.236 kg)  BMI 17.81 kg/m2  SpO2 100%  Physical Exam  Nursing note and vitals reviewed. Constitutional: He is oriented to person, place, and time. He appears well-developed and well-nourished. No distress.  HENT:  Head: Normocephalic. Head is with laceration.    Mouth/Throat: Oropharynx is clear and moist.  Eyes: Conjunctivae normal and EOM are normal. Pupils are equal, round, and reactive to light.  Neck: Normal range of motion. Neck supple. No spinous process tenderness and no muscular tenderness present.  Cardiovascular: Normal rate, regular rhythm and intact distal pulses.   No murmur heard. Pulmonary/Chest: Effort normal and breath sounds normal. No respiratory distress. He has no wheezes. He has no rales.  Abdominal: Soft. He exhibits no distension. There is no tenderness. There  is no rebound and no guarding.  Musculoskeletal: He exhibits no edema and no tenderness.       Right wrist: He exhibits decreased range of motion, tenderness, bony tenderness, swelling and deformity.       Right ankle: He exhibits swelling and ecchymosis. He exhibits normal range of motion, no deformity and normal pulse. tenderness. Lateral malleolus and medial malleolus tenderness found.       2+ right radial pulse.  2+ DP and PT pulses on the right.  Neurological: He is alert and oriented to person, place, and time.  Skin: Skin is warm and dry. No rash noted. No erythema.  Psychiatric: He has a normal mood and affect. His behavior is normal.    ED Course  Procedures (including critical care time)  Labs Reviewed - No data to display Dg Wrist Complete Right  02/09/2012  *RADIOLOGY REPORT*  Clinical Data: Wrist pain after fall.  RIGHT WRIST - COMPLETE 3+ VIEW  Comparison: None.  Findings: Minimally displaced and comminuted fracture is seen involving the distal radius with intra-articular extension.  Distal ulna appears normal.  IMPRESSION: Minimally-displaced comminuted fracture of distal radius with intra- articular extension.   Original Report Authenticated By: Lupita Raider.,  M.D.    Dg Ankle Complete Right  02/09/2012  *RADIOLOGY REPORT*  Clinical Data: Ankle pain after fall.  RIGHT ANKLE - COMPLETE 3+ VIEW  Comparison: None.  Findings: No fracture or dislocation is noted.  Joint spaces are intact.  No soft tissue swelling is noted.  IMPRESSION: Normal right ankle.   Original Report Authenticated By: Lupita Raider.,  M.D.    Ct Head Wo Contrast  02/09/2012  *RADIOLOGY REPORT*  Clinical Data: Fall, laceration  CT HEAD WITHOUT CONTRAST  Technique:  Contiguous axial images were obtained from the base of the skull through the vertex without contrast.  Comparison: None.  Findings: No acute intracranial hemorrhage.  No focal mass lesion. No CT evidence of acute infarction.   No midline shift  or mass effect.  No hydrocephalus.  Basilar cisterns are patent.  There is generalized cortical atrophy and periventricular white matter hypodensities.  Paranasal sinuses and mastoid air cells are clear.  Orbits are normal.  IMPRESSION:  1.  No intracranial trauma. 2.  No  skull fracture. 3.  Atrophy and microvascular disease.   Original Report Authenticated By: Genevive Bi, M.D.    LACERATION REPAIR Performed by: Gwyneth Sprout Authorized by: Gwyneth Sprout Consent: Verbal consent obtained. Risks and benefits: risks, benefits and alternatives were discussed Consent given by: patient Patient identity confirmed: provided demographic data Prepped and Draped in normal sterile fashion Wound explored  Laceration Location: Right forehead  Laceration Length: 3 cm  No Foreign Bodies seen or palpated  Anesthesia: None  Irrigation method: Skin scrub  Amount of cleaning: standard  Skin closure: Dermabond   Number of sutures: Dermabond   Technique: Dermabond   Patient tolerance: Patient tolerated the procedure well with no immediate complications.   1. Closed fracture of right distal radius   2. Right ankle sprain   3. Forehead laceration       MDM   Patient with a mechanical fall today where he hit his head on the step. No LOC but he does take Plavix. Denies any neck pain. Evidence of laceration to the forehead which was repaired with Dermabond. Tetanus shot is up-to-date. Also complaining of pain in the right wrist and ankle. Plain films show a minimally displaced comminuted fracture of the distal radius with interarticular extension. Ankle films negative and head CT is negative. Patient's wrist was splinted and given ortho hand followup.  Ankle air splint placed.  Given pain control.        Gwyneth Sprout, MD 02/09/12 1654  Gwyneth Sprout, MD 02/09/12 [phone]

## 2012-02-09 NOTE — ED Notes (Signed)
Was going up steps-fell backwards-pain to right wrist and right ankle-lac noted to forehead-denies LOC

## 2012-02-13 DIAGNOSIS — S52539A Colles' fracture of unspecified radius, initial encounter for closed fracture: Secondary | ICD-10-CM | POA: Diagnosis not present

## 2012-02-19 DIAGNOSIS — S5290XD Unspecified fracture of unspecified forearm, subsequent encounter for closed fracture with routine healing: Secondary | ICD-10-CM | POA: Diagnosis not present

## 2012-03-05 DIAGNOSIS — S5290XD Unspecified fracture of unspecified forearm, subsequent encounter for closed fracture with routine healing: Secondary | ICD-10-CM | POA: Diagnosis not present

## 2012-03-05 DIAGNOSIS — S52599A Other fractures of lower end of unspecified radius, initial encounter for closed fracture: Secondary | ICD-10-CM | POA: Diagnosis not present

## 2012-03-29 DIAGNOSIS — S52539A Colles' fracture of unspecified radius, initial encounter for closed fracture: Secondary | ICD-10-CM | POA: Diagnosis not present

## 2012-03-29 DIAGNOSIS — S5290XD Unspecified fracture of unspecified forearm, subsequent encounter for closed fracture with routine healing: Secondary | ICD-10-CM | POA: Diagnosis not present

## 2012-04-05 ENCOUNTER — Other Ambulatory Visit: Payer: Self-pay | Admitting: Dermatology

## 2012-04-05 DIAGNOSIS — L570 Actinic keratosis: Secondary | ICD-10-CM | POA: Diagnosis not present

## 2012-04-05 DIAGNOSIS — Z85828 Personal history of other malignant neoplasm of skin: Secondary | ICD-10-CM | POA: Diagnosis not present

## 2012-04-05 DIAGNOSIS — D485 Neoplasm of uncertain behavior of skin: Secondary | ICD-10-CM | POA: Diagnosis not present

## 2012-04-05 DIAGNOSIS — L821 Other seborrheic keratosis: Secondary | ICD-10-CM | POA: Diagnosis not present

## 2012-04-05 DIAGNOSIS — D0470 Carcinoma in situ of skin of unspecified lower limb, including hip: Secondary | ICD-10-CM | POA: Diagnosis not present

## 2012-05-02 DIAGNOSIS — S5290XD Unspecified fracture of unspecified forearm, subsequent encounter for closed fracture with routine healing: Secondary | ICD-10-CM | POA: Diagnosis not present

## 2012-05-16 ENCOUNTER — Other Ambulatory Visit: Payer: Medicare Other

## 2012-05-16 ENCOUNTER — Ambulatory Visit: Payer: Medicare Other | Admitting: Neurosurgery

## 2012-05-23 DIAGNOSIS — D0470 Carcinoma in situ of skin of unspecified lower limb, including hip: Secondary | ICD-10-CM | POA: Diagnosis not present

## 2012-05-24 ENCOUNTER — Ambulatory Visit: Payer: Self-pay | Admitting: Neurosurgery

## 2012-05-24 ENCOUNTER — Other Ambulatory Visit (INDEPENDENT_AMBULATORY_CARE_PROVIDER_SITE_OTHER): Payer: Medicare Other | Admitting: *Deleted

## 2012-05-24 DIAGNOSIS — I6529 Occlusion and stenosis of unspecified carotid artery: Secondary | ICD-10-CM

## 2012-05-27 ENCOUNTER — Other Ambulatory Visit: Payer: Self-pay | Admitting: *Deleted

## 2012-05-28 ENCOUNTER — Encounter: Payer: Self-pay | Admitting: Vascular Surgery

## 2012-05-30 ENCOUNTER — Encounter: Payer: Self-pay | Admitting: Gastroenterology

## 2012-08-09 DIAGNOSIS — L820 Inflamed seborrheic keratosis: Secondary | ICD-10-CM | POA: Diagnosis not present

## 2012-08-09 DIAGNOSIS — L570 Actinic keratosis: Secondary | ICD-10-CM | POA: Diagnosis not present

## 2012-10-01 DIAGNOSIS — H2510 Age-related nuclear cataract, unspecified eye: Secondary | ICD-10-CM | POA: Diagnosis not present

## 2012-10-01 DIAGNOSIS — H04129 Dry eye syndrome of unspecified lacrimal gland: Secondary | ICD-10-CM | POA: Diagnosis not present

## 2012-10-24 DIAGNOSIS — Q828 Other specified congenital malformations of skin: Secondary | ICD-10-CM | POA: Diagnosis not present

## 2012-11-05 DIAGNOSIS — L820 Inflamed seborrheic keratosis: Secondary | ICD-10-CM | POA: Diagnosis not present

## 2012-11-18 ENCOUNTER — Ambulatory Visit (INDEPENDENT_AMBULATORY_CARE_PROVIDER_SITE_OTHER): Payer: Medicare Other | Admitting: Family Medicine

## 2012-11-18 ENCOUNTER — Encounter: Payer: Self-pay | Admitting: Family Medicine

## 2012-11-18 VITALS — BP 116/68 | HR 60 | Temp 97.8°F | Ht 72.0 in | Wt 135.8 lb

## 2012-11-18 DIAGNOSIS — E785 Hyperlipidemia, unspecified: Secondary | ICD-10-CM | POA: Diagnosis not present

## 2012-11-18 DIAGNOSIS — N529 Male erectile dysfunction, unspecified: Secondary | ICD-10-CM

## 2012-11-18 DIAGNOSIS — I6522 Occlusion and stenosis of left carotid artery: Secondary | ICD-10-CM

## 2012-11-18 DIAGNOSIS — R351 Nocturia: Secondary | ICD-10-CM | POA: Diagnosis not present

## 2012-11-18 DIAGNOSIS — Z1331 Encounter for screening for depression: Secondary | ICD-10-CM | POA: Diagnosis not present

## 2012-11-18 DIAGNOSIS — Z0000 Encounter for general adult medical examination without abnormal findings: Secondary | ICD-10-CM

## 2012-11-18 DIAGNOSIS — I10 Essential (primary) hypertension: Secondary | ICD-10-CM | POA: Diagnosis not present

## 2012-11-18 DIAGNOSIS — I6529 Occlusion and stenosis of unspecified carotid artery: Secondary | ICD-10-CM | POA: Diagnosis not present

## 2012-11-18 DIAGNOSIS — M810 Age-related osteoporosis without current pathological fracture: Secondary | ICD-10-CM | POA: Diagnosis not present

## 2012-11-18 DIAGNOSIS — Z125 Encounter for screening for malignant neoplasm of prostate: Secondary | ICD-10-CM

## 2012-11-18 LAB — HEPATIC FUNCTION PANEL
ALT: 16 U/L (ref 0–53)
AST: 20 U/L (ref 0–37)
Alkaline Phosphatase: 67 U/L (ref 39–117)
Bilirubin, Direct: 0.1 mg/dL (ref 0.0–0.3)
Total Protein: 6.8 g/dL (ref 6.0–8.3)

## 2012-11-18 LAB — CBC WITH DIFFERENTIAL/PLATELET
Basophils Relative: 0.5 % (ref 0.0–3.0)
Eosinophils Absolute: 0.2 10*3/uL (ref 0.0–0.7)
Eosinophils Relative: 2.5 % (ref 0.0–5.0)
Hemoglobin: 13.7 g/dL (ref 13.0–17.0)
Lymphocytes Relative: 26.8 % (ref 12.0–46.0)
Monocytes Relative: 6.7 % (ref 3.0–12.0)
Neutro Abs: 5.3 10*3/uL (ref 1.4–7.7)
Neutrophils Relative %: 63.5 % (ref 43.0–77.0)
RBC: 4.3 Mil/uL (ref 4.22–5.81)
WBC: 8.3 10*3/uL (ref 4.5–10.5)

## 2012-11-18 LAB — BASIC METABOLIC PANEL
Calcium: 9.2 mg/dL (ref 8.4–10.5)
Creatinine, Ser: 0.8 mg/dL (ref 0.4–1.5)
Sodium: 137 mEq/L (ref 135–145)

## 2012-11-18 LAB — PSA: PSA: 4.37 ng/mL — ABNORMAL HIGH (ref 0.10–4.00)

## 2012-11-18 LAB — LIPID PANEL
HDL: 53.8 mg/dL (ref >39.00)
Total CHOL/HDL Ratio: 3

## 2012-11-18 MED ORDER — ATORVASTATIN CALCIUM 20 MG PO TABS: 20.0000 mg | ORAL_TABLET | Freq: Every day | ORAL | Status: DC

## 2012-11-18 MED ORDER — CLOPIDOGREL BISULFATE 75 MG PO TABS: 75.0000 mg | ORAL_TABLET | Freq: Every day | ORAL | Status: DC

## 2012-11-18 MED ORDER — ALENDRONATE SODIUM 70 MG PO TABS: 70.0000 mg | ORAL_TABLET | ORAL | Status: DC

## 2012-11-18 NOTE — Patient Instructions (Signed)
Preventive Care for Adults, Male  A healthy lifestyle and preventive care can promote health and wellness. Preventive health guidelines for men include the following key practices:  · A routine yearly physical is a good way to check with your caregiver about your health and preventative screening. It is a chance to share any concerns and updates on your health, and to receive a thorough exam.  · Visit your dentist for a routine exam and preventative care every 6 months. Brush your teeth twice a day and floss once a day. Good oral hygiene prevents tooth decay and gum disease.  · The frequency of eye exams is based on your age, health, family medical history, use of contact lenses, and other factors. Follow your caregiver's recommendations for frequency of eye exams.  · Eat a healthy diet. Foods like vegetables, fruits, whole grains, low-fat dairy products, and lean protein foods contain the nutrients you need without too many calories. Decrease your intake of foods high in solid fats, added sugars, and salt. Eat the right amount of calories for you. Get information about a proper diet from your caregiver, if necessary.  · Regular physical exercise is one of the most important things you can do for your health. Most adults should get at least 150 minutes of moderate-intensity exercise (any activity that increases your heart rate and causes you to sweat) each week. In addition, most adults need muscle-strengthening exercises on 2 or more days a week.  · Maintain a healthy weight. The body mass index (BMI) is a screening tool to identify possible weight problems. It provides an estimate of body fat based on height and weight. Your caregiver can help determine your BMI, and can help you achieve or maintain a healthy weight. For adults 20 years and older:  · A BMI below 18.5 is considered underweight.  · A BMI of 18.5 to 24.9 is normal.  · A BMI of 25 to 29.9 is considered overweight.  · A BMI of 30 and above is  considered obese.  · Maintain normal blood lipids and cholesterol levels by exercising and minimizing your intake of saturated fat. Eat a balanced diet with plenty of fruit and vegetables. Blood tests for lipids and cholesterol should begin at age 20 and be repeated every 5 years. If your lipid or cholesterol levels are high, you are over 50, or you are a high risk for heart disease, you may need your cholesterol levels checked more frequently. Ongoing high lipid and cholesterol levels should be treated with medicines if diet and exercise are not effective.  · If you smoke, find out from your caregiver how to quit. If you do not use tobacco, do not start.  · If you choose to drink alcohol, do not exceed 2 drinks per day. One drink is considered to be 12 ounces (355 mL) of beer, 5 ounces (148 mL) of wine, or 1.5 ounces (44 mL) of liquor.  · Avoid use of street drugs. Do not share needles with anyone. Ask for help if you need support or instructions about stopping the use of drugs.  · High blood pressure causes heart disease and increases the risk of stroke. Your blood pressure should be checked at least every 1 to 2 years. Ongoing high blood pressure should be treated with medicines, if weight loss and exercise are not effective.  · If you are 45 to 77 years old, ask your caregiver if you should take aspirin to prevent heart disease.  · Diabetes screening involves taking   a blood sample to check your fasting blood sugar level. This should be done once every 3 years, after age 45, if you are within normal weight and without risk factors for diabetes. Testing should be considered at a younger age or be carried out more frequently if you are overweight and have at least 1 risk factor for diabetes.  · Colorectal cancer can be detected and often prevented. Most routine colorectal cancer screening begins at the age of 50 and continues through age 75. However, your caregiver may recommend screening at an earlier age if you  have risk factors for colon cancer. On a yearly basis, your caregiver may provide home test kits to check for hidden blood in the stool. Use of a small camera at the end of a tube, to directly examine the colon (sigmoidoscopy or colonoscopy), can detect the earliest forms of colorectal cancer. Talk to your caregiver about this at age 50, when routine screening begins.  Direct examination of the colon should be repeated every 5 to 10 years through age 75, unless early forms of pre-cancerous polyps or small growths are found.  · Hepatitis C blood testing is recommended for all people born from 1945 through 1965 and any individual with known risks for hepatitis C.  · Practice safe sex. Use condoms and avoid high-risk sexual practices to reduce the spread of sexually transmitted infections (STIs). STIs include gonorrhea, chlamydia, syphilis, trichomonas, herpes, HPV, and human immunodeficiency virus (HIV). Herpes, HIV, and HPV are viral illnesses that have no cure. They can result in disability, cancer, and death.  · A one-time screening for abdominal aortic aneurysm (AAA) and surgical repair of large AAAs by sound wave imaging (ultrasonography) is recommended for ages 65 to 75 years who are current or former smokers.  · Healthy men should no longer receive prostate-specific antigen (PSA) blood tests as part of routine cancer screening. Consult with your caregiver about prostate cancer screening.  · Testicular cancer screening is not recommended for adult males who have no symptoms. Screening includes self-exam, caregiver exam, and other screening tests. Consult with your caregiver about any symptoms you have or any concerns you have about testicular cancer.  · Use sunscreen with skin protection factor (SPF) of 30 or more. Apply sunscreen liberally and repeatedly throughout the day. You should seek shade when your shadow is shorter than you. Protect yourself by wearing long sleeves, pants, a wide-brimmed hat, and  sunglasses year round, whenever you are outdoors.  · Once a month, do a whole body skin exam, using a mirror to look at the skin on your back. Notify your caregiver of new moles, moles that have irregular borders, moles that are larger than a pencil eraser, or moles that have changed in shape or color.  · Stay current with required immunizations.  · Influenza. You need a dose every fall (or winter). The composition of the flu vaccine changes each year, so being vaccinated once is not enough.  · Pneumococcal polysaccharide. You need 1 to 2 doses if you smoke cigarettes or if you have certain chronic medical conditions. You need 1 dose at age 65 (or older) if you have never been vaccinated.  · Tetanus, diphtheria, pertussis (Tdap, Td). Get 1 dose of Tdap vaccine if you are younger than age 65 years, are over 65 and have contact with an infant, are a healthcare worker, or simply want to be protected from whooping cough. After that, you need a Td booster dose every 10 years. Consult your   caregiver if you have not had at least 3 tetanus and diphtheria-containing shots sometime in your life or have a deep or dirty wound.  · HPV. This vaccine is recommended for males 13 through 77 years of age. This vaccine may be given to men 22 through 77 years of age who have not completed the 3 dose series. It is recommended for men through age 26 who have sex with men or whose immune system is weakened because of HIV infection, other illness, or medications. The vaccine is given in 3 doses over 6 months.  · Measles, mumps, rubella (MMR). You need at least 1 dose of MMR if you were born in 1957 or later. You may also need a 2nd dose.  · Meningococcal. If you are age 19 to 21 years and a first-year college student living in a residence hall, or have one of several medical conditions, you need to get vaccinated against meningococcal disease. You may also need additional booster doses.  · Zoster (shingles). If you are age 60 years or  older, you should get this vaccine.  · Varicella (chickenpox). If you have never had chickenpox or you were vaccinated but received only 1 dose, talk to your caregiver to find out if you need this vaccine.  · Hepatitis A. You need this vaccine if you have a specific risk factor for hepatitis A virus infection, or you simply wish to be protected from this disease. The vaccine is usually given as 2 doses, 6 to 18 months apart.  · Hepatitis B. You need this vaccine if you have a specific risk factor for hepatitis B virus infection or you simply wish to be protected from this disease. The vaccine is given in 3 doses, usually over 6 months.  Preventative Service / Frequency  Ages 19 to 39  · Blood pressure check.** / Every 1 to 2 years.  · Lipid and cholesterol check.** / Every 5 years beginning at age 20.  · Hepatitis C blood test.** / For any individual with known risks for hepatitis C.  · Skin self-exam. / Monthly.  · Influenza immunization.** / Every year.  · Pneumococcal polysaccharide immunization.** / 1 to 2 doses if you smoke cigarettes or if you have certain chronic medical conditions.  · Tetanus, diphtheria, pertussis (Tdap,Td) immunization. / A one-time dose of Tdap vaccine. After that, you need a Td booster dose every 10 years.  · HPV immunization. / 3 doses over 6 months, if 26 and younger.  · Measles, mumps, rubella (MMR) immunization. / You need at least 1 dose of MMR if you were born in 1957 or later. You may also need a 2nd dose.  · Meningococcal immunization. / 1 dose if you are age 19 to 21 years and a first-year college student living in a residence hall, or have one of several medical conditions, you need to get vaccinated against meningococcal disease. You may also need additional booster doses.  · Varicella immunization.** / Consult your caregiver.  · Hepatitis A immunization.** / Consult your caregiver. 2 doses, 6 to 18 months apart.  · Hepatitis B immunization.** / Consult your caregiver. 3 doses  usually over 6 months.  Ages 40 to 64  · Blood pressure check.** / Every 1 to 2 years.  · Lipid and cholesterol check.** / Every 5 years beginning at age 20.  · Fecal occult blood test (FOBT) of stool. / Every year beginning at age 50 and continuing until age 75. You may not have   to do this test if you get colonoscopy every 10 years.  · Flexible sigmoidoscopy** or colonoscopy.** / Every 5 years for a flexible sigmoidoscopy or every 10 years for a colonoscopy beginning at age 50 and continuing until age 75.  · Hepatitis C blood test.** / For all people born from 1945 through 1965 and any individual with known risks for hepatitis C.  · Skin self-exam. / Monthly.  · Influenza immunization.** / Every year.  · Pneumococcal polysaccharide immunization.** / 1 to 2 doses if you smoke cigarettes or if you have certain chronic medical conditions.  · Tetanus, diphtheria, pertussis (Tdap/Td) immunization.** / A one-time dose of Tdap vaccine. After that, you need a Td booster dose every 10 years.  · Measles, mumps, rubella (MMR) immunization.  / You need at least 1 dose of MMR if you were born in 1957 or later. You may also need a 2nd dose.  · Varicella immunization.**/ Consult your caregiver.  · Meningococcal immunization.** / Consult your caregiver.  · Hepatitis A immunization.** / Consult your caregiver. 2 doses, 6 to 18 months apart.  · Hepatitis B immunization.** / Consult your caregiver. 3 doses, usually over 6 months.  Ages 65 and over  · Blood pressure check.** / Every 1 to 2 years.  · Lipid and cholesterol check.**/ Every 5 years beginning at age 20.  · Fecal occult blood test (FOBT) of stool. / Every year beginning at age 50 and continuing until age 75. You may not have to do this test if you get colonoscopy every 10 years.  · Flexible sigmoidoscopy** or colonoscopy.** / Every 5 years for a flexible sigmoidoscopy or every 10 years for a colonoscopy beginning at age 50 and continuing until age 75.  · Hepatitis C blood  test.** / For all people born from 1945 through 1965 and any individual with known risks for hepatitis C.  · Abdominal aortic aneurysm (AAA) screening.** / A one-time screening for ages 65 to 75 years who are current or former smokers.  · Skin self-exam. / Monthly.  · Influenza immunization.** / Every year.  · Pneumococcal polysaccharide immunization.** / 1 dose at age 65 (or older) if you have never been vaccinated.  · Tetanus, diphtheria, pertussis (Tdap, Td) immunization. / A one-time dose of Tdap vaccine if you are over 65 and have contact with an infant, are a healthcare worker, or simply want to be protected from whooping cough. After that, you need a Td booster dose every 10 years.  · Varicella immunization. ** / Consult your caregiver.  · Meningococcal immunization.** / Consult your caregiver.  · Hepatitis A immunization. ** / Consult your caregiver. 2 doses, 6 to 18 months apart.  · Hepatitis B immunization.** / Check with your caregiver. 3 doses, usually over 6 months.  **Family history and personal history of risk and conditions may change your caregiver's recommendations.  Document Released: 04/11/2001 Document Revised: 05/08/2011 Document Reviewed: 07/11/2010  [employer]® Patient Information ©2014 [employer].

## 2012-11-18 NOTE — Assessment & Plan Note (Signed)
con't meds Check bmd

## 2012-11-18 NOTE — Assessment & Plan Note (Signed)
con't to monitor dopplers F;u vascular

## 2012-11-18 NOTE — Assessment & Plan Note (Signed)
Check labs 

## 2012-11-18 NOTE — Progress Notes (Signed)
Subjective:    Rick [name] is a 79 y.o. male who presents for Medicare Annual/Subsequent preventive examination.   Preventive Screening-Counseling & Management  Tobacco History  Smoking status  . Current Every Day Smoker -- 0.50 packs/day for 50 years  . Types: Cigarettes  Smokeless tobacco  . Not on file    Problems Prior to Visit 1.   Current Problems (verified) Patient Active Problem List   Diagnosis Date Noted  . Carotid bruit 10/10/2010  . Preventative health care 10/10/2010  . RIB PAIN, RIGHT SIDED 10/11/2009  . UTI 09/28/2009  . TOBACCO USER 09/23/2008  . OSTEOPENIA 05/10/2008  . BACK PAIN, ACUTE 05/08/2008  . KNEE SPRAIN, LEFT, MEDIAL COLLATERAL LIGAMENT 01/02/2007  . TESTOSTERONE DEFICIENCY 10/02/2006  . HYPERLIPIDEMIA NEC/NOS 08/01/2006  . ERECTILE DYSFUNCTION 08/01/2006  . BENIGN PROSTATIC HYPERTROPHY, HX OF 08/01/2006  . HYPERTENSION 07/27/2006  . CAROTID STENOSIS 07/27/2006  . MICROSCOPIC HEMATURIA 07/27/2006  . OSTEOPOROSIS 07/27/2006  . COLONIC POLYPS, HX OF 07/27/2006    Medications Prior to Visit Current Outpatient Prescriptions on File Prior to Visit  Medication Sig Dispense Refill  . Calcium-Vitamin D (CALTRATE 600 PLUS-VIT D PO) Take 600 mg by mouth 2 (two) times daily.       . cyanocobalamin 100 MCG tablet Take 100 mcg by mouth daily.      Marland Kitchen ezetimibe-simvastatin (VYTORIN) 10-40 MG per tablet Take 1 tablet by mouth at bedtime.      Marland Kitchen HYDROcodone-acetaminophen (NORCO/VICODIN) 5-325 MG per tablet Take 1 tablet by mouth every 6 (six) hours as needed for pain.  10 tablet  0  . MULTIPLE VITAMIN PO Take by mouth.        . Saw Palmetto 450 MG CAPS Take by mouth.        . sildenafil (VIAGRA) 100 MG tablet Take 100 mg by mouth daily as needed.        Marland Kitchen VITAMIN D, CHOLECALCIFEROL, PO Take by mouth.         No current facility-administered medications on file prior to visit.    Current Medications (verified) Current Outpatient Prescriptions   Medication Sig Dispense Refill  . alendronate (FOSAMAX) 70 MG tablet Take 1 tablet (70 mg total) by mouth every 7 (seven) days. Take with a full glass of water on an empty stomach.  12 tablet  3  . atorvastatin (LIPITOR) 20 MG tablet Take 1 tablet (20 mg total) by mouth daily.  90 tablet  3  . Calcium-Vitamin D (CALTRATE 600 PLUS-VIT D PO) Take 600 mg by mouth 2 (two) times daily.       . cyanocobalamin 100 MCG tablet Take 100 mcg by mouth daily.      Marland Kitchen ezetimibe-simvastatin (VYTORIN) 10-40 MG per tablet Take 1 tablet by mouth at bedtime.      Marland Kitchen HYDROcodone-acetaminophen (NORCO/VICODIN) 5-325 MG per tablet Take 1 tablet by mouth every 6 (six) hours as needed for pain.  10 tablet  0  . MULTIPLE VITAMIN PO Take by mouth.        . Saw Palmetto 450 MG CAPS Take by mouth.        . sildenafil (VIAGRA) 100 MG tablet Take 100 mg by mouth daily as needed.        Marland Kitchen VITAMIN D, CHOLECALCIFEROL, PO Take by mouth.        . clopidogrel (PLAVIX) 75 MG tablet Take 1 tablet (75 mg total) by mouth daily.  90 tablet  3   No current facility-administered medications  for this visit.     Allergies (verified) Aspirin and Penicillins   PAST HISTORY  Family History Family History  Problem Relation Age of Onset  . Stroke Mother   . Seizures Father     Social History History  Substance Use Topics  . Smoking status: Current Every Day Smoker -- 0.50 packs/day for 50 years    Types: Cigarettes  . Smokeless tobacco: Not on file  . Alcohol Use: 0.0 oz/week    Are there smokers in your home (other than you)?  No  Risk Factors Current exercise habits: walks  Dietary issues discussed: na   Cardiac risk factors: advanced age (older than 76 for men, 37 for women), dyslipidemia and male gender.  Depression Screen (Note: if answer to either of the following is "Yes", a more complete depression screening is indicated)   Q1: Over the past two weeks, have you felt down, depressed or hopeless? No  Q2: Over the  past two weeks, have you felt little interest or pleasure in doing things? No  Have you lost interest or pleasure in daily life? No  Do you often feel hopeless? No  Do you cry easily over simple problems? No  Activities of Daily Living In your present state of health, do you have any difficulty performing the following activities?:  Driving? No Managing money?  No Feeding yourself? No Getting from bed to chair? No Climbing a flight of stairs? No Preparing food and eating?: No Bathing or showering? No Getting dressed: No Getting to the toilet? No Using the toilet:No Moving around from place to place: No In the past year have you fallen or had a near fall?:No   Are you sexually active?  Yes  Do you have more than one partner?  No  Hearing Difficulties: Yes Do you often ask people to speak up or repeat themselves? Yes Do you experience ringing or noises in your ears? No Do you have difficulty understanding soft or whispered voices? No   Do you feel that you have a problem with memory? No  Do you often misplace items? No  Do you feel safe at home?  No  Cognitive Testing  Alert? Yes  Normal Appearance?Yes  Oriented to person? Yes  Place? Yes   Time? Yes  Recall of three objects?  Yes  Can perform simple calculations? Yes  Displays appropriate judgment?Yes  Can read the correct time from a watch face?Yes   Advanced Directives have been discussed with the patient? Yes   List the Names of Other Physician/Practitioners you currently use: 1.    Indicate any recent Medical Services you may have received from other than [facility] providers in the past year (date may be approximate).  Immunization History  Administered Date(s) Administered  . Influenza Whole 11/28/1998, 12/25/2006, 12/09/2008, 11/15/2009, 11/14/2010, 11/20/2011  . Pneumococcal Polysaccharide 05/22/2002  . Td 12/01/1996, 03/15/2005  . Zoster 09/13/2005, 03/15/2006, 05/29/2006    Screening Tests Health  Maintenance  Topic Date Due  . Influenza Vaccine  09/27/2012  . Tetanus/tdap  03/16/2015  . Colonoscopy  09/24/2018  . Pneumococcal Polysaccharide Vaccine Age 61 And Over  Completed  . Zostavax  Completed    All answers were reviewed with the patient and necessary referrals were made:  Loreen Freud, DO   11/18/2012   History reviewed:  He  has a past medical history of colonic polyps; Hypertension; Kidney stones; Chronic ear infection; Hyperlipidemia; BPH (benign prostatic hyperplasia); Osteoporosis; and Carotid artery occlusion. He  does not  have any pertinent problems on file. He  has past surgical history that includes Tonsillectomy. His family history includes Seizures in his father; Stroke in his mother. He  reports that he has been smoking Cigarettes.  He has a 25 pack-year smoking history. He does not have any smokeless tobacco history on file. He reports that  drinks alcohol. He reports that he does not use illicit drugs. He has a current medication list which includes the following prescription(s): alendronate, atorvastatin, calcium-vitamin d, cyanocobalamin, ezetimibe-simvastatin, hydrocodone-acetaminophen, multiple vitamin, saw palmetto, sildenafil, cholecalciferol, and clopidogrel. Current Outpatient Prescriptions on File Prior to Visit  Medication Sig Dispense Refill  . Calcium-Vitamin D (CALTRATE 600 PLUS-VIT D PO) Take 600 mg by mouth 2 (two) times daily.       . cyanocobalamin 100 MCG tablet Take 100 mcg by mouth daily.      Marland Kitchen ezetimibe-simvastatin (VYTORIN) 10-40 MG per tablet Take 1 tablet by mouth at bedtime.      Marland Kitchen HYDROcodone-acetaminophen (NORCO/VICODIN) 5-325 MG per tablet Take 1 tablet by mouth every 6 (six) hours as needed for pain.  10 tablet  0  . MULTIPLE VITAMIN PO Take by mouth.        . Saw Palmetto 450 MG CAPS Take by mouth.        . sildenafil (VIAGRA) 100 MG tablet Take 100 mg by mouth daily as needed.        Marland Kitchen VITAMIN D, CHOLECALCIFEROL, PO Take by  mouth.         No current facility-administered medications on file prior to visit.   He is allergic to aspirin and penicillins.  Review of Systems  Review of Systems  Constitutional: Negative for activity change, appetite change and fatigue.  HENT: Negative for hearing loss, congestion, tinnitus and ear discharge.   Eyes: Negative for visual disturbance (see optho q1y -- vision corrected to 20/20 with glasses).  Respiratory: Negative for cough, chest tightness and shortness of breath.   Cardiovascular: Negative for chest pain, palpitations and leg swelling.  Gastrointestinal: Negative for abdominal pain, diarrhea, constipation and abdominal distention.  Genitourinary: Negative for urgency, frequency, decreased urine volume and difficulty urinating.  Musculoskeletal: Negative for back pain, arthralgias and gait problem.  Skin: Negative for color change, pallor and rash.  Neurological: Negative for dizziness, light-headedness, numbness and headaches.  Hematological: Negative for adenopathy. Does not bruise/bleed easily.  Psychiatric/Behavioral: Negative for suicidal ideas, confusion, sleep disturbance, self-injury, dysphoric mood, decreased concentration and agitation.  Pt is able to read and write and can do all ADLs No risk for falling No abuse/ violence in home      Objective:     Vision by Snellen chart: opth Blood pressure 116/68, pulse 60, temperature 97.8 F (36.6 C), temperature source Oral, height 6' (1.829 m), weight 135 lb 12.8 oz (61.598 kg), SpO2 97.00%. Body mass index is 18.41 kg/(m^2).  BP 116/68  Pulse 60  Temp(Src) 97.8 F (36.6 C) (Oral)  Ht 6' (1.829 m)  Wt 135 lb 12.8 oz (61.598 kg)  BMI 18.41 kg/m2  SpO2 97% General appearance: alert, cooperative, appears stated age and no distress Head: Normocephalic, without obvious abnormality, atraumatic Eyes: conjunctivae/corneas clear. PERRL, EOM's intact. Fundi benign. Ears: normal TM's and external ear canals  both ears Nose: Nares normal. Septum midline. Mucosa normal. No drainage or sinus tenderness. Throat: lips, mucosa, and tongue normal; teeth and gums normal Neck: no adenopathy, no carotid bruit, no JVD, supple, symmetrical, trachea midline and thyroid not enlarged, symmetric, no tenderness/mass/nodules  Back: symmetric, no curvature. ROM normal. No CVA tenderness. Lungs: clear to auscultation bilaterally Chest wall: no tenderness Heart: regular rate and rhythm, S1, S2 normal, no murmur, click, rub or gallop Abdomen: soft, non-tender; bowel sounds normal; no masses,  no organomegaly Male genitalia: normal Rectal: soft brown guaiac negative stool noted and prostate is enlarged Extremities: extremities normal, atraumatic, no cyanosis or edema Pulses: 2+ and symmetric Skin: Skin color, texture, turgor normal. No rashes or lesions Lymph nodes: Cervical, supraclavicular, and axillary nodes normal. Neurologic: Alert and oriented X 3, normal strength and tone. Normal symmetric reflexes. Normal coordination and gait Psych-- no anxiety, no depression      Assessment:     cpe      Plan:     During the course of the visit the patient was educated and counseled about appropriate screening and preventive services including:    Pneumococcal vaccine   Influenza vaccine  Td vaccine  Screening electrocardiogram  Prostate cancer screening  Colorectal cancer screening  Glaucoma screening  Advanced directives: has an advanced directive - a copy has been provided  Diet review for nutrition referral? Yes ____  Not Indicated __x__   Patient Instructions (the written plan) was given to the patient.  Medicare Attestation I have personally reviewed: The patient's medical and social history Their use of alcohol, tobacco or illicit drugs Their current medications and supplements The patient's functional ability including ADLs,fall risks, home safety risks, cognitive, and hearing and  visual impairment Diet and physical activities Evidence for depression or mood disorders  The patient's weight, height, BMI, and visual acuity have been recorded in the chart.  I have made referrals, counseling, and provided education to the patient based on review of the above and I have provided the patient with a written personalized care plan for preventive services.     Loreen Freud, DO   11/18/2012

## 2012-11-19 LAB — POCT URINALYSIS DIPSTICK
Bilirubin, UA: NEGATIVE
Leukocytes, UA: NEGATIVE
Nitrite, UA: NEGATIVE
pH, UA: 7

## 2012-11-20 DIAGNOSIS — Z23 Encounter for immunization: Secondary | ICD-10-CM | POA: Diagnosis not present

## 2012-12-03 DIAGNOSIS — Z85828 Personal history of other malignant neoplasm of skin: Secondary | ICD-10-CM | POA: Diagnosis not present

## 2012-12-03 DIAGNOSIS — L821 Other seborrheic keratosis: Secondary | ICD-10-CM | POA: Diagnosis not present

## 2012-12-03 DIAGNOSIS — L570 Actinic keratosis: Secondary | ICD-10-CM | POA: Diagnosis not present

## 2012-12-04 ENCOUNTER — Encounter: Payer: Self-pay | Admitting: Family Medicine

## 2012-12-11 ENCOUNTER — Ambulatory Visit (INDEPENDENT_AMBULATORY_CARE_PROVIDER_SITE_OTHER)
Admission: RE | Admit: 2012-12-11 | Discharge: 2012-12-11 | Disposition: A | Discharge status: Home / Self Care | Payer: Medicare Other | Source: Ambulatory Visit | Attending: Family Medicine | Admitting: Family Medicine

## 2012-12-11 DIAGNOSIS — M810 Age-related osteoporosis without current pathological fracture: Secondary | ICD-10-CM | POA: Diagnosis not present

## 2012-12-31 DIAGNOSIS — N139 Obstructive and reflux uropathy, unspecified: Secondary | ICD-10-CM | POA: Diagnosis not present

## 2012-12-31 DIAGNOSIS — R972 Elevated prostate specific antigen [PSA]: Secondary | ICD-10-CM | POA: Diagnosis not present

## 2012-12-31 DIAGNOSIS — N401 Enlarged prostate with lower urinary tract symptoms: Secondary | ICD-10-CM | POA: Diagnosis not present

## 2013-01-01 ENCOUNTER — Telehealth: Payer: Self-pay | Admitting: *Deleted

## 2013-01-16 ENCOUNTER — Telehealth: Payer: Self-pay

## 2013-01-16 ENCOUNTER — Ambulatory Visit (INDEPENDENT_AMBULATORY_CARE_PROVIDER_SITE_OTHER): Payer: Medicare Other | Admitting: *Deleted

## 2013-01-16 DIAGNOSIS — M810 Age-related osteoporosis without current pathological fracture: Secondary | ICD-10-CM | POA: Diagnosis not present

## 2013-01-16 MED ORDER — DENOSUMAB 60 MG/ML ~~LOC~~ SOLN
60.0000 mg | Freq: Once | SUBCUTANEOUS | Status: AC
  Administered 2013-01-16: 60 mg via SUBCUTANEOUS

## 2013-01-16 NOTE — Telephone Encounter (Signed)
Prolia benefits verified and is covered. The cost is 20 % with the office visit and the secondary insurance should pick up the balance that is not covered. The patient has agreed to get the injection and his apt has been scheduled for this afternoon.  BMP 11/18/12       KP

## 2013-02-10 DIAGNOSIS — H7200 Central perforation of tympanic membrane, unspecified ear: Secondary | ICD-10-CM | POA: Diagnosis not present

## 2013-04-04 ENCOUNTER — Other Ambulatory Visit: Payer: Self-pay | Admitting: Vascular Surgery

## 2013-04-04 DIAGNOSIS — I6529 Occlusion and stenosis of unspecified carotid artery: Secondary | ICD-10-CM

## 2013-05-08 DIAGNOSIS — D239 Other benign neoplasm of skin, unspecified: Secondary | ICD-10-CM | POA: Diagnosis not present

## 2013-05-08 DIAGNOSIS — L570 Actinic keratosis: Secondary | ICD-10-CM | POA: Diagnosis not present

## 2013-05-08 DIAGNOSIS — L821 Other seborrheic keratosis: Secondary | ICD-10-CM | POA: Diagnosis not present

## 2013-05-08 DIAGNOSIS — Z85828 Personal history of other malignant neoplasm of skin: Secondary | ICD-10-CM | POA: Diagnosis not present

## 2013-05-08 DIAGNOSIS — D485 Neoplasm of uncertain behavior of skin: Secondary | ICD-10-CM | POA: Diagnosis not present

## 2013-05-09 DIAGNOSIS — L570 Actinic keratosis: Secondary | ICD-10-CM | POA: Diagnosis not present

## 2013-05-09 DIAGNOSIS — B079 Viral wart, unspecified: Secondary | ICD-10-CM | POA: Diagnosis not present

## 2013-05-15 ENCOUNTER — Telehealth: Payer: Self-pay | Admitting: Family Medicine

## 2013-05-15 NOTE — Telephone Encounter (Signed)
Spoke with patient and he voiced understanding of Dr.Lowne recommendations.    KP

## 2013-05-15 NOTE — Telephone Encounter (Signed)
He is scheduled with Vas on 05/27/13 Occlusion and stenosis of carotid artery without mention of cerebral infarction.    KP

## 2013-05-15 NOTE — Telephone Encounter (Signed)
Yes-- we watch it periodically to make sure it does not worsen.

## 2013-05-15 NOTE — Telephone Encounter (Signed)
Patient called wanting to know if he should still keep his appointment with the cardiologist because last time his results were normal. Please advise.

## 2013-05-26 ENCOUNTER — Encounter: Payer: Self-pay | Admitting: Vascular Surgery

## 2013-05-27 ENCOUNTER — Ambulatory Visit (HOSPITAL_COMMUNITY)
Admission: RE | Admit: 2013-05-27 | Discharge: 2013-05-27 | Disposition: A | Discharge status: Home / Self Care | Payer: Medicare Other | Source: Ambulatory Visit | Attending: Vascular Surgery | Admitting: Vascular Surgery

## 2013-05-27 ENCOUNTER — Encounter: Payer: Self-pay | Admitting: Vascular Surgery

## 2013-05-27 ENCOUNTER — Other Ambulatory Visit (HOSPITAL_COMMUNITY): Payer: Medicare Other

## 2013-05-27 ENCOUNTER — Ambulatory Visit (INDEPENDENT_AMBULATORY_CARE_PROVIDER_SITE_OTHER): Payer: Medicare Other | Admitting: Vascular Surgery

## 2013-05-27 ENCOUNTER — Ambulatory Visit: Payer: Medicare Other | Admitting: Vascular Surgery

## 2013-05-27 VITALS — BP 115/54 | HR 50 | Ht 72.0 in | Wt 141.5 lb

## 2013-05-27 DIAGNOSIS — I6529 Occlusion and stenosis of unspecified carotid artery: Secondary | ICD-10-CM | POA: Insufficient documentation

## 2013-05-27 DIAGNOSIS — I6523 Occlusion and stenosis of bilateral carotid arteries: Secondary | ICD-10-CM

## 2013-05-27 NOTE — Progress Notes (Signed)
Subjective:     Patient ID: Rick [name], male   DOB: [birth year], 80 y.o.   MRN: [id number]  HPI this 80 year old male returns for followup regarding his left ICA stenosis which has been asymptomatic and stable in the past. He has an approximate 60% left ICA stenosis which we have been following. He denies lateralizing weakness, aphasia, and proceed as, diplopia, blurred vision, and syncope. He has no previous history of stroke. He denies claudication and is able to ambulate for 45 minutes at a brisk pace. He has no cardiac symptoms.  Past Medical History  Diagnosis Date  . Hx of colonic polyps   . Hypertension   . Kidney stones   . Chronic ear infection     left  . Hyperlipidemia   . BPH (benign prostatic hyperplasia)   . Osteoporosis   . Carotid artery occlusion     History  Substance Use Topics  . Smoking status: Current Every Day Smoker -- 0.50 packs/day for 50 years    Types: Cigarettes  . Smokeless tobacco: Not on file  . Alcohol Use: 0.0 oz/week    Family History  Problem Relation Age of Onset  . Stroke Mother   . Seizures Father     Allergies  Allergen Reactions  . Aspirin Hives and Itching  . Penicillins Hives and Itching    Current outpatient prescriptions:alendronate (FOSAMAX) 70 MG tablet, Take 1 tablet (70 mg total) by mouth every 7 (seven) days. Take with a full glass of water on an empty stomach., Disp: 12 tablet, Rfl: 3;  atorvastatin (LIPITOR) 20 MG tablet, Take 1 tablet (20 mg total) by mouth daily., Disp: 90 tablet, Rfl: 3;  Calcium-Vitamin D (CALTRATE 600 PLUS-VIT D PO), Take 600 mg by mouth 2 (two) times daily. , Disp: , Rfl:  clopidogrel (PLAVIX) 75 MG tablet, Take 1 tablet (75 mg total) by mouth daily., Disp: 90 tablet, Rfl: 3;  cyanocobalamin 100 MCG tablet, Take 100 mcg by mouth daily., Disp: , Rfl: ;  ezetimibe-simvastatin (VYTORIN) 10-40 MG per tablet, Take 1 tablet by mouth at bedtime., Disp: , Rfl: ;  HYDROcodone-acetaminophen (NORCO/VICODIN)  5-325 MG per tablet, Take 1 tablet by mouth every 6 (six) hours as needed for pain., Disp: 10 tablet, Rfl: 0 MULTIPLE VITAMIN PO, Take by mouth.  , Disp: , Rfl: ;  Saw Palmetto 450 MG CAPS, Take by mouth.  , Disp: , Rfl: ;  sildenafil (VIAGRA) 100 MG tablet, Take 100 mg by mouth daily as needed.  , Disp: , Rfl: ;  VITAMIN D, CHOLECALCIFEROL, PO, Take by mouth.  , Disp: , Rfl:   BP 115/54  Pulse 50  Ht 6' (1.829 m)  Wt 141 lb 8 oz (64.184 kg)  BMI 19.19 kg/m2  SpO2 99%  Body mass index is 19.19 kg/(m^2).           Review of Systems denies chest pain, dyspnea on exertion, PND, orthopnea, hemoptysis-all systems negative and complete review of systems    Objective:   Physical Exam BP 115/54  Pulse 50  Ht 6' (1.829 m)  Wt 141 lb 8 oz (64.184 kg)  BMI 19.19 kg/m2  SpO2 99%  Gen.-alert and oriented x3 in no apparent distress HEENT normal for age Lungs no rhonchi or wheezing Cardiovascular regular rhythm no murmurs carotid pulses 3+ palpable -soft bruit on the left Abdomen soft nontender no palpable masses Musculoskeletal free of  major deformities Skin clear -no rashes Neurologic normal Lower extremities 3+ femoral and dorsalis  pedis pulses palpable bilaterally with no edema  Today I ordered a carotid duplex exam which are reviewed and interpreted Left ICA stenosis approximate 60% has not changed from previous exams. Right ICA has minimal stenosis.     Assessment:      stable carotid occlusive disease with 60% left ICA stenosis-asymptomatic    Plan:      return in one year for continued followup unless patient develops neurologic symptoms Currently patient takes Plavix because he is allergic to aspirin-will continue this regimen

## 2013-05-27 NOTE — Addendum Note (Signed)
Addended by: Mena Goes on: 05/27/2013 05:01 PM   Modules accepted: Orders

## 2013-06-23 DIAGNOSIS — R972 Elevated prostate specific antigen [PSA]: Secondary | ICD-10-CM | POA: Diagnosis not present

## 2013-07-10 ENCOUNTER — Telehealth: Payer: Self-pay | Admitting: Family Medicine

## 2013-07-10 NOTE — Telephone Encounter (Signed)
Caller name: Rennie Relation to pt: self  Call back [phone]  Reason for call:  Pt wants to get information on the injection he gets every 6 months or so.  Pt calls it his bone density injection.

## 2013-07-10 NOTE — Telephone Encounter (Signed)
Patient aware that I am in the process of verify his benefits and I will call when the insurance company has approved the injection.      KP

## 2013-07-15 DIAGNOSIS — N401 Enlarged prostate with lower urinary tract symptoms: Secondary | ICD-10-CM | POA: Diagnosis not present

## 2013-07-15 DIAGNOSIS — R972 Elevated prostate specific antigen [PSA]: Secondary | ICD-10-CM | POA: Diagnosis not present

## 2013-07-15 DIAGNOSIS — N139 Obstructive and reflux uropathy, unspecified: Secondary | ICD-10-CM | POA: Diagnosis not present

## 2013-07-25 ENCOUNTER — Telehealth: Payer: Self-pay | Admitting: Family Medicine

## 2013-07-25 NOTE — Telephone Encounter (Signed)
Lmovm to call our office back to schedule his Prolia injection.

## 2013-07-29 ENCOUNTER — Ambulatory Visit (INDEPENDENT_AMBULATORY_CARE_PROVIDER_SITE_OTHER): Payer: Medicare Other | Admitting: *Deleted

## 2013-07-29 DIAGNOSIS — M899 Disorder of bone, unspecified: Secondary | ICD-10-CM

## 2013-07-29 DIAGNOSIS — M949 Disorder of cartilage, unspecified: Secondary | ICD-10-CM | POA: Diagnosis not present

## 2013-07-29 DIAGNOSIS — M8580 Other specified disorders of bone density and structure, unspecified site: Secondary | ICD-10-CM

## 2013-07-29 MED ORDER — DENOSUMAB 60 MG/ML ~~LOC~~ SOLN
60.0000 mg | Freq: Once | SUBCUTANEOUS | Status: AC
  Administered 2013-07-29: 60 mg via SUBCUTANEOUS

## 2013-07-30 NOTE — Telephone Encounter (Signed)
Late entry:  Benenfits were verified on 07/24/13. No PA is required. Benefits are subject to a $147 deductible which was met and 20% co-insurance for the administration and cost or Prolia.  The secondary plan does not follow Medicare guidelines. The secondary plan will consider the Medicare Part B deductible and co-insurance up to the primary plans allowable. Claims will cross over for coordination. Benefits verification sent to be scanned      KP

## 2013-09-11 ENCOUNTER — Telehealth: Payer: Self-pay | Admitting: Family Medicine

## 2013-09-11 DIAGNOSIS — L84 Corns and callosities: Secondary | ICD-10-CM

## 2013-09-11 NOTE — Telephone Encounter (Signed)
Caller name: Devery  Call back [phone]  Or [phone]   Reason for call:  Pt states that the calices on foot have returned and he wants to be referred back to place that he went to before to have them scrapped down.

## 2013-09-11 NOTE — Telephone Encounter (Signed)
Please advise      KP 

## 2013-09-11 NOTE — Telephone Encounter (Signed)
Refer to podiatry

## 2013-10-07 DIAGNOSIS — H2510 Age-related nuclear cataract, unspecified eye: Secondary | ICD-10-CM | POA: Diagnosis not present

## 2013-10-15 ENCOUNTER — Telehealth: Payer: Self-pay | Admitting: Family Medicine

## 2013-10-15 DIAGNOSIS — I6522 Occlusion and stenosis of left carotid artery: Secondary | ICD-10-CM

## 2013-10-15 MED ORDER — CLOPIDOGREL BISULFATE 75 MG PO TABS: 75.0000 mg | ORAL_TABLET | Freq: Every day | ORAL | Status: DC

## 2013-10-15 MED ORDER — ATORVASTATIN CALCIUM 20 MG PO TABS: 20.0000 mg | ORAL_TABLET | Freq: Every day | ORAL | Status: DC

## 2013-10-15 NOTE — Telephone Encounter (Signed)
Caller name: Polly  Relation to pt: spouse  Call back number: [phone] Pharmacy: *[facility] [facility] [phone]  Reason for call: pt is requesting a refill atorvastatin (LIPITOR) 20 MG tablet & clopidogrel (PLAVIX) 75 MG tablet . Pt is requesting a change in pharmacy due to fact the cost of medication will be cheaper. [place] [facility] [phone]

## 2013-11-18 DIAGNOSIS — Z23 Encounter for immunization: Secondary | ICD-10-CM | POA: Diagnosis not present

## 2013-12-01 ENCOUNTER — Other Ambulatory Visit: Payer: Self-pay | Admitting: Dermatology

## 2013-12-01 DIAGNOSIS — L570 Actinic keratosis: Secondary | ICD-10-CM | POA: Diagnosis not present

## 2013-12-01 DIAGNOSIS — Z85828 Personal history of other malignant neoplasm of skin: Secondary | ICD-10-CM | POA: Diagnosis not present

## 2013-12-01 DIAGNOSIS — D229 Melanocytic nevi, unspecified: Secondary | ICD-10-CM | POA: Diagnosis not present

## 2013-12-01 DIAGNOSIS — L821 Other seborrheic keratosis: Secondary | ICD-10-CM | POA: Diagnosis not present

## 2013-12-01 DIAGNOSIS — D044 Carcinoma in situ of skin of scalp and neck: Secondary | ICD-10-CM | POA: Diagnosis not present

## 2013-12-01 DIAGNOSIS — D485 Neoplasm of uncertain behavior of skin: Secondary | ICD-10-CM | POA: Diagnosis not present

## 2013-12-16 ENCOUNTER — Encounter: Payer: Medicare Other | Admitting: Family Medicine

## 2013-12-29 NOTE — Telephone Encounter (Signed)
Error

## 2014-01-05 ENCOUNTER — Other Ambulatory Visit: Payer: Self-pay

## 2014-01-05 DIAGNOSIS — I6522 Occlusion and stenosis of left carotid artery: Secondary | ICD-10-CM

## 2014-01-05 MED ORDER — ATORVASTATIN CALCIUM 20 MG PO TABS: 20.0000 mg | ORAL_TABLET | Freq: Every day | ORAL | Status: DC

## 2014-01-05 MED ORDER — CLOPIDOGREL BISULFATE 75 MG PO TABS: 75.0000 mg | ORAL_TABLET | Freq: Every day | ORAL | Status: DC

## 2014-01-26 ENCOUNTER — Telehealth: Payer: Self-pay | Admitting: Family Medicine

## 2014-01-26 NOTE — Telephone Encounter (Signed)
Patient called in stating he is due for next Prolia, informed patient would call back when ready to schedule

## 2014-01-26 NOTE — Telephone Encounter (Signed)
Information faxed to Prolia 1 week ago, waiting on a response    KP

## 2014-02-03 DIAGNOSIS — D485 Neoplasm of uncertain behavior of skin: Secondary | ICD-10-CM | POA: Diagnosis not present

## 2014-02-03 DIAGNOSIS — D0439 Carcinoma in situ of skin of other parts of face: Secondary | ICD-10-CM | POA: Diagnosis not present

## 2014-02-04 ENCOUNTER — Ambulatory Visit (INDEPENDENT_AMBULATORY_CARE_PROVIDER_SITE_OTHER): Payer: Medicare Other

## 2014-02-04 DIAGNOSIS — M810 Age-related osteoporosis without current pathological fracture: Secondary | ICD-10-CM

## 2014-02-04 MED ORDER — DENOSUMAB 60 MG/ML ~~LOC~~ SOLN
60.0000 mg | Freq: Once | SUBCUTANEOUS | Status: AC
  Administered 2014-02-04: 60 mg via SUBCUTANEOUS

## 2014-02-04 NOTE — Telephone Encounter (Signed)
Spoke with patient and benefits have been verified, he is aware that he can come in and have his Prolia injection. He said he would call back to have it done today.      KP

## 2014-02-05 ENCOUNTER — Other Ambulatory Visit: Payer: Self-pay | Admitting: Dermatology

## 2014-02-05 DIAGNOSIS — D485 Neoplasm of uncertain behavior of skin: Secondary | ICD-10-CM | POA: Diagnosis not present

## 2014-02-05 DIAGNOSIS — C44229 Squamous cell carcinoma of skin of left ear and external auricular canal: Secondary | ICD-10-CM | POA: Diagnosis not present

## 2014-02-05 DIAGNOSIS — L570 Actinic keratosis: Secondary | ICD-10-CM | POA: Diagnosis not present

## 2014-02-17 DIAGNOSIS — H7202 Central perforation of tympanic membrane, left ear: Secondary | ICD-10-CM | POA: Diagnosis not present

## 2014-02-17 DIAGNOSIS — H903 Sensorineural hearing loss, bilateral: Secondary | ICD-10-CM | POA: Diagnosis not present

## 2014-03-06 ENCOUNTER — Encounter: Payer: Self-pay | Admitting: Family Medicine

## 2014-03-06 ENCOUNTER — Ambulatory Visit (INDEPENDENT_AMBULATORY_CARE_PROVIDER_SITE_OTHER): Payer: Medicare Other | Admitting: Family Medicine

## 2014-03-06 VITALS — BP 137/67 | HR 61 | Temp 97.4°F | Resp 16 | Ht 73.5 in | Wt 131.6 lb

## 2014-03-06 DIAGNOSIS — Z23 Encounter for immunization: Secondary | ICD-10-CM | POA: Diagnosis not present

## 2014-03-06 DIAGNOSIS — I10 Essential (primary) hypertension: Secondary | ICD-10-CM | POA: Diagnosis not present

## 2014-03-06 DIAGNOSIS — E785 Hyperlipidemia, unspecified: Secondary | ICD-10-CM | POA: Diagnosis not present

## 2014-03-06 DIAGNOSIS — N521 Erectile dysfunction due to diseases classified elsewhere: Secondary | ICD-10-CM

## 2014-03-06 DIAGNOSIS — N400 Enlarged prostate without lower urinary tract symptoms: Secondary | ICD-10-CM | POA: Diagnosis not present

## 2014-03-06 DIAGNOSIS — Z0000 Encounter for general adult medical examination without abnormal findings: Secondary | ICD-10-CM

## 2014-03-06 LAB — POCT URINALYSIS DIPSTICK
BILIRUBIN UA: NEGATIVE
Blood, UA: NEGATIVE
Glucose, UA: NEGATIVE
KETONES UA: NEGATIVE
LEUKOCYTES UA: NEGATIVE
Nitrite, UA: NEGATIVE
Spec Grav, UA: 1.015
UROBILINOGEN UA: 0.2
pH, UA: 7

## 2014-03-06 LAB — CBC WITH DIFFERENTIAL/PLATELET
BASOS PCT: 0.5 % (ref 0.0–3.0)
Basophils Absolute: 0 10*3/uL (ref 0.0–0.1)
EOS ABS: 0.2 10*3/uL (ref 0.0–0.7)
EOS PCT: 2.5 % (ref 0.0–5.0)
HEMATOCRIT: 43.2 % (ref 39.0–52.0)
Hemoglobin: 14 g/dL (ref 13.0–17.0)
LYMPHS ABS: 2.1 10*3/uL (ref 0.7–4.0)
LYMPHS PCT: 23.3 % (ref 12.0–46.0)
MCHC: 32.4 g/dL (ref 30.0–36.0)
MCV: 96.6 fl (ref 78.0–100.0)
MONO ABS: 0.6 10*3/uL (ref 0.1–1.0)
MONOS PCT: 6.6 % (ref 3.0–12.0)
NEUTROS ABS: 6.1 10*3/uL (ref 1.4–7.7)
Neutrophils Relative %: 67.1 % (ref 43.0–77.0)
PLATELETS: 245 10*3/uL (ref 150.0–400.0)
RBC: 4.48 Mil/uL (ref 4.22–5.81)
RDW: 14.3 % (ref 11.5–15.5)
WBC: 9.1 10*3/uL (ref 4.0–10.5)

## 2014-03-06 LAB — HEPATIC FUNCTION PANEL
ALT: 15 U/L (ref 0–53)
AST: 23 U/L (ref 0–37)
Albumin: 4.2 g/dL (ref 3.5–5.2)
Alkaline Phosphatase: 74 U/L (ref 39–117)
BILIRUBIN DIRECT: 0.1 mg/dL (ref 0.0–0.3)
BILIRUBIN TOTAL: 0.6 mg/dL (ref 0.2–1.2)
Total Protein: 7.1 g/dL (ref 6.0–8.3)

## 2014-03-06 LAB — BASIC METABOLIC PANEL
BUN: 13 mg/dL (ref 6–23)
CO2: 29 mEq/L (ref 19–32)
Calcium: 9.7 mg/dL (ref 8.4–10.5)
Chloride: 106 mEq/L (ref 96–112)
Creatinine, Ser: 0.8 mg/dL (ref 0.4–1.5)
GFR: 100.6 mL/min (ref >60.00)
GLUCOSE: 85 mg/dL (ref 70–99)
Potassium: 4.8 mEq/L (ref 3.5–5.1)
SODIUM: 142 meq/L (ref 135–145)

## 2014-03-06 LAB — LIPID PANEL
CHOLESTEROL: 161 mg/dL (ref 0–200)
HDL: 51.2 mg/dL (ref >39.00)
LDL CALC: 91 mg/dL (ref 0–99)
NonHDL: 109.8
Total CHOL/HDL Ratio: 3
Triglycerides: 94 mg/dL (ref 0.0–149.0)
VLDL: 18.8 mg/dL (ref 0.0–40.0)

## 2014-03-06 LAB — PSA: PSA: 5 ng/mL — ABNORMAL HIGH (ref 0.10–4.00)

## 2014-03-06 NOTE — Progress Notes (Signed)
Pre visit review using our clinic review tool, if applicable. No additional management support is needed unless otherwise documented below in the visit note. 

## 2014-03-06 NOTE — Patient Instructions (Signed)
Preventive Care for Adults A healthy lifestyle and preventive care can promote health and wellness. Preventive health guidelines for men include the following key practices:  A routine yearly physical is a good way to check with your health care provider about your health and preventative screening. It is a chance to share any concerns and updates on your health and to receive a thorough exam.  Visit your dentist for a routine exam and preventative care every 6 months. Brush your teeth twice a day and floss once a day. Good oral hygiene prevents tooth decay and gum disease.  The frequency of eye exams is based on your age, health, family medical history, use of contact lenses, and other factors. Follow your health care provider's recommendations for frequency of eye exams.  Eat a healthy diet. Foods such as vegetables, fruits, whole grains, low-fat dairy products, and lean protein foods contain the nutrients you need without too many calories. Decrease your intake of foods high in solid fats, added sugars, and salt. Eat the right amount of calories for you.Get information about a proper diet from your health care provider, if necessary.  Regular physical exercise is one of the most important things you can do for your health. Most adults should get at least 150 minutes of moderate-intensity exercise (any activity that increases your heart rate and causes you to sweat) each week. In addition, most adults need muscle-strengthening exercises on 2 or more days a week.  Maintain a healthy weight. The body mass index (BMI) is a screening tool to identify possible weight problems. It provides an estimate of body fat based on height and weight. Your health care provider can find your BMI and can help you achieve or maintain a healthy weight.For adults 20 years and older:  A BMI below 18.5 is considered underweight.  A BMI of 18.5 to 24.9 is normal.  A BMI of 25 to 29.9 is considered overweight.  A BMI  of 30 and above is considered obese.  Maintain normal blood lipids and cholesterol levels by exercising and minimizing your intake of saturated fat. Eat a balanced diet with plenty of fruit and vegetables. Blood tests for lipids and cholesterol should begin at age 50 and be repeated every 5 years. If your lipid or cholesterol levels are high, you are over 50, or you are at high risk for heart disease, you may need your cholesterol levels checked more frequently.Ongoing high lipid and cholesterol levels should be treated with medicines if diet and exercise are not working.  If you smoke, find out from your health care provider how to quit. If you do not use tobacco, do not start.  Lung cancer screening is recommended for adults aged 73-80 years who are at high risk for developing lung cancer because of a history of smoking. A yearly low-dose CT scan of the lungs is recommended for people who have at least a 30-pack-year history of smoking and are a current smoker or have quit within the past 15 years. A pack year of smoking is smoking an average of 1 pack of cigarettes a day for 1 year (for example: 1 pack a day for 30 years or 2 packs a day for 15 years). Yearly screening should continue until the smoker has stopped smoking for at least 15 years. Yearly screening should be stopped for people who develop a health problem that would prevent them from having lung cancer treatment.  If you choose to drink alcohol, do not have more than  2 drinks per day. One drink is considered to be 12 ounces (355 mL) of beer, 5 ounces (148 mL) of wine, or 1.5 ounces (44 mL) of liquor.  Avoid use of street drugs. Do not share needles with anyone. Ask for help if you need support or instructions about stopping the use of drugs.  High blood pressure causes heart disease and increases the risk of stroke. Your blood pressure should be checked at least every 1-2 years. Ongoing high blood pressure should be treated with  medicines, if weight loss and exercise are not effective.  If you are 45-79 years old, ask your health care provider if you should take aspirin to prevent heart disease.  Diabetes screening involves taking a blood sample to check your fasting blood sugar level. This should be done once every 3 years, after age 45, if you are within normal weight and without risk factors for diabetes. Testing should be considered at a younger age or be carried out more frequently if you are overweight and have at least 1 risk factor for diabetes.  Colorectal cancer can be detected and often prevented. Most routine colorectal cancer screening begins at the age of 50 and continues through age 75. However, your health care provider may recommend screening at an earlier age if you have risk factors for colon cancer. On a yearly basis, your health care provider may provide home test kits to check for hidden blood in the stool. Use of a small camera at the end of a tube to directly examine the colon (sigmoidoscopy or colonoscopy) can detect the earliest forms of colorectal cancer. Talk to your health care provider about this at age 50, when routine screening begins. Direct exam of the colon should be repeated every 5-10 years through age 75, unless early forms of precancerous polyps or small growths are found.  People who are at an increased risk for hepatitis B should be screened for this virus. You are considered at high risk for hepatitis B if:  You were born in a country where hepatitis B occurs often. Talk with your health care provider about which countries are considered high risk.  Your parents were born in a high-risk country and you have not received a shot to protect against hepatitis B (hepatitis B vaccine).  You have HIV or AIDS.  You use needles to inject street drugs.  You live with, or have sex with, someone who has hepatitis B.  You are a man who has sex with other men (MSM).  You get hemodialysis  treatment.  You take certain medicines for conditions such as cancer, organ transplantation, and autoimmune conditions.  Hepatitis C blood testing is recommended for all people born from 1945 through 1965 and any individual with known risks for hepatitis C.  Practice safe sex. Use condoms and avoid high-risk sexual practices to reduce the spread of sexually transmitted infections (STIs). STIs include gonorrhea, chlamydia, syphilis, trichomonas, herpes, HPV, and human immunodeficiency virus (HIV). Herpes, HIV, and HPV are viral illnesses that have no cure. They can result in disability, cancer, and death.  If you are at risk of being infected with HIV, it is recommended that you take a prescription medicine daily to prevent HIV infection. This is called preexposure prophylaxis (PrEP). You are considered at risk if:  You are a man who has sex with other men (MSM) and have other risk factors.  You are a heterosexual man, are sexually active, and are at increased risk for HIV infection.    You take drugs by injection.  You are sexually active with a partner who has HIV.  Talk with your health care provider about whether you are at high risk of being infected with HIV. If you choose to begin PrEP, you should first be tested for HIV. You should then be tested every 3 months for as long as you are taking PrEP.  A one-time screening for abdominal aortic aneurysm (AAA) and surgical repair of large AAAs by ultrasound are recommended for men ages 32 to 67 years who are current or former smokers.  Healthy men should no longer receive prostate-specific antigen (PSA) blood tests as part of routine cancer screening. Talk with your health care provider about prostate cancer screening.  Testicular cancer screening is not recommended for adult males who have no symptoms. Screening includes self-exam, a health care provider exam, and other screening tests. Consult with your health care provider about any symptoms  you have or any concerns you have about testicular cancer.  Use sunscreen. Apply sunscreen liberally and repeatedly throughout the day. You should seek shade when your shadow is shorter than you. Protect yourself by wearing long sleeves, pants, a wide-brimmed hat, and sunglasses year round, whenever you are outdoors.  Once a month, do a whole-body skin exam, using a mirror to look at the skin on your back. Tell your health care provider about new moles, moles that have irregular borders, moles that are larger than a pencil eraser, or moles that have changed in shape or color.  Stay current with required vaccines (immunizations).  Influenza vaccine. All adults should be immunized every year.  Tetanus, diphtheria, and acellular pertussis (Td, Tdap) vaccine. An adult who has not previously received Tdap or who does not know his vaccine status should receive 1 dose of Tdap. This initial dose should be followed by tetanus and diphtheria toxoids (Td) booster doses every 10 years. Adults with an unknown or incomplete history of completing a 3-dose immunization series with Td-containing vaccines should begin or complete a primary immunization series including a Tdap dose. Adults should receive a Td booster every 10 years.  Varicella vaccine. An adult without evidence of immunity to varicella should receive 2 doses or a second dose if he has previously received 1 dose.  Human papillomavirus (HPV) vaccine. Males aged 68-21 years who have not received the vaccine previously should receive the 3-dose series. Males aged 22-26 years may be immunized. Immunization is recommended through the age of 6 years for any male who has sex with males and did not get any or all doses earlier. Immunization is recommended for any person with an immunocompromised condition through the age of 49 years if he did not get any or all doses earlier. During the 3-dose series, the second dose should be obtained 4-8 weeks after the first  dose. The third dose should be obtained 24 weeks after the first dose and 16 weeks after the second dose.  Zoster vaccine. One dose is recommended for adults aged 50 years or older unless certain conditions are present.  Measles, mumps, and rubella (MMR) vaccine. Adults born before [birth year] generally are considered immune to measles and mumps. Adults born in [birth year] or later should have 1 or more doses of MMR vaccine unless there is a contraindication to the vaccine or there is laboratory evidence of immunity to each of the three diseases. A routine second dose of MMR vaccine should be obtained at least 28 days after the first dose for students attending postsecondary  schools, health care workers, or international travelers. People who received inactivated measles vaccine or an unknown type of measles vaccine during 1963-1967 should receive 2 doses of MMR vaccine. People who received inactivated mumps vaccine or an unknown type of mumps vaccine before 1979 and are at high risk for mumps infection should consider immunization with 2 doses of MMR vaccine. Unvaccinated health care workers born before 1957 who lack laboratory evidence of measles, mumps, or rubella immunity or laboratory confirmation of disease should consider measles and mumps immunization with 2 doses of MMR vaccine or rubella immunization with 1 dose of MMR vaccine.  Pneumococcal 13-valent conjugate (PCV13) vaccine. When indicated, a person who is uncertain of his immunization history and has no record of immunization should receive the PCV13 vaccine. An adult aged 19 years or older who has certain medical conditions and has not been previously immunized should receive 1 dose of PCV13 vaccine. This PCV13 should be followed with a dose of pneumococcal polysaccharide (PPSV23) vaccine. The PPSV23 vaccine dose should be obtained at least 8 weeks after the dose of PCV13 vaccine. An adult aged 19 years or older who has certain medical conditions and  previously received 1 or more doses of PPSV23 vaccine should receive 1 dose of PCV13. The PCV13 vaccine dose should be obtained 1 or more years after the last PPSV23 vaccine dose.  Pneumococcal polysaccharide (PPSV23) vaccine. When PCV13 is also indicated, PCV13 should be obtained first. All adults aged 65 years and older should be immunized. An adult younger than age 65 years who has certain medical conditions should be immunized. Any person who resides in a nursing home or long-term care facility should be immunized. An adult smoker should be immunized. People with an immunocompromised condition and certain other conditions should receive both PCV13 and PPSV23 vaccines. People with human immunodeficiency virus (HIV) infection should be immunized as soon as possible after diagnosis. Immunization during chemotherapy or radiation therapy should be avoided. Routine use of PPSV23 vaccine is not recommended for American Indians, [place] Natives, or people younger than 65 years unless there are medical conditions that require PPSV23 vaccine. When indicated, people who have unknown immunization and have no record of immunization should receive PPSV23 vaccine. One-time revaccination 5 years after the first dose of PPSV23 is recommended for people aged 19-64 years who have chronic kidney failure, nephrotic syndrome, asplenia, or immunocompromised conditions. People who received 1-2 doses of PPSV23 before age 65 years should receive another dose of PPSV23 vaccine at age 65 years or later if at least 5 years have passed since the previous dose. Doses of PPSV23 are not needed for people immunized with PPSV23 at or after age 65 years.  Meningococcal vaccine. Adults with asplenia or persistent complement component deficiencies should receive 2 doses of quadrivalent meningococcal conjugate (MenACWY-D) vaccine. The doses should be obtained at least 2 months apart. Microbiologists working with certain meningococcal bacteria,  military recruits, people at risk during an outbreak, and people who travel to or live in countries with a high rate of meningitis should be immunized. A first-year college student up through age 21 years who is living in a residence hall should receive a dose if he did not receive a dose on or after his 16th birthday. Adults who have certain high-risk conditions should receive one or more doses of vaccine.  Hepatitis A vaccine. Adults who wish to be protected from this disease, have certain high-risk conditions, work with hepatitis A-infected animals, work in hepatitis A research labs, or   travel to or work in countries with a high rate of hepatitis A should be immunized. Adults who were previously unvaccinated and who anticipate close contact with an international adoptee during the first 60 days after arrival in the [place] from a country with a high rate of hepatitis A should be immunized.  Hepatitis B vaccine. Adults should be immunized if they wish to be protected from this disease, have certain high-risk conditions, may be exposed to blood or other infectious body fluids, are household contacts or sex partners of hepatitis B positive people, are clients or workers in certain care facilities, or travel to or work in countries with a high rate of hepatitis B.  Haemophilus influenzae type b (Hib) vaccine. A previously unvaccinated person with asplenia or sickle cell disease or having a scheduled splenectomy should receive 1 dose of Hib vaccine. Regardless of previous immunization, a recipient of a hematopoietic stem cell transplant should receive a 3-dose series 6-12 months after his successful transplant. Hib vaccine is not recommended for adults with HIV infection. Preventive Service / Frequency Ages 52 to 17  Blood pressure check.** / Every 1 to 2 years.  Lipid and cholesterol check.** / Every 5 years beginning at age 69.  Hepatitis C blood test.** / For any individual with known risks for  hepatitis C.  Skin self-exam. / Monthly.  Influenza vaccine. / Every year.  Tetanus, diphtheria, and acellular pertussis (Tdap, Td) vaccine.** / Consult your health care provider. 1 dose of Td every 10 years.  Varicella vaccine.** / Consult your health care provider.  HPV vaccine. / 3 doses over 6 months, if 72 or younger.  Measles, mumps, rubella (MMR) vaccine.** / You need at least 1 dose of MMR if you were born in 1957 or later. You may also need a second dose.  Pneumococcal 13-valent conjugate (PCV13) vaccine.** / Consult your health care provider.  Pneumococcal polysaccharide (PPSV23) vaccine.** / 1 to 2 doses if you smoke cigarettes or if you have certain conditions.  Meningococcal vaccine.** / 1 dose if you are age 35 to 60 years and a Market researcher living in a residence hall, or have one of several medical conditions. You may also need additional booster doses.  Hepatitis A vaccine.** / Consult your health care provider.  Hepatitis B vaccine.** / Consult your health care provider.  Haemophilus influenzae type b (Hib) vaccine.** / Consult your health care provider. Ages 35 to 8  Blood pressure check.** / Every 1 to 2 years.  Lipid and cholesterol check.** / Every 5 years beginning at age 57.  Lung cancer screening. / Every year if you are aged 44-80 years and have a 30-pack-year history of smoking and currently smoke or have quit within the past 15 years. Yearly screening is stopped once you have quit smoking for at least 15 years or develop a health problem that would prevent you from having lung cancer treatment.  Fecal occult blood test (FOBT) of stool. / Every year beginning at age 55 and continuing until age 73. You may not have to do this test if you get a colonoscopy every 10 years.  Flexible sigmoidoscopy** or colonoscopy.** / Every 5 years for a flexible sigmoidoscopy or every 10 years for a colonoscopy beginning at age 28 and continuing until age  1.  Hepatitis C blood test.** / For all people born from [birth year] through 1965 and any individual with known risks for hepatitis C.  Skin self-exam. / Monthly.  Influenza vaccine. / Every  year.  Tetanus, diphtheria, and acellular pertussis (Tdap/Td) vaccine.** / Consult your health care provider. 1 dose of Td every 10 years.  Varicella vaccine.** / Consult your health care provider.  Zoster vaccine.** / 1 dose for adults aged 53 years or older.  Measles, mumps, rubella (MMR) vaccine.** / You need at least 1 dose of MMR if you were born in 1957 or later. You may also need a second dose.  Pneumococcal 13-valent conjugate (PCV13) vaccine.** / Consult your health care provider.  Pneumococcal polysaccharide (PPSV23) vaccine.** / 1 to 2 doses if you smoke cigarettes or if you have certain conditions.  Meningococcal vaccine.** / Consult your health care provider.  Hepatitis A vaccine.** / Consult your health care provider.  Hepatitis B vaccine.** / Consult your health care provider.  Haemophilus influenzae type b (Hib) vaccine.** / Consult your health care provider. Ages 77 and over  Blood pressure check.** / Every 1 to 2 years.  Lipid and cholesterol check.**/ Every 5 years beginning at age 85.  Lung cancer screening. / Every year if you are aged 55-80 years and have a 30-pack-year history of smoking and currently smoke or have quit within the past 15 years. Yearly screening is stopped once you have quit smoking for at least 15 years or develop a health problem that would prevent you from having lung cancer treatment.  Fecal occult blood test (FOBT) of stool. / Every year beginning at age 33 and continuing until age 11. You may not have to do this test if you get a colonoscopy every 10 years.  Flexible sigmoidoscopy** or colonoscopy.** / Every 5 years for a flexible sigmoidoscopy or every 10 years for a colonoscopy beginning at age 28 and continuing until age 73.  Hepatitis C blood  test.** / For all people born from [birth year] through 1965 and any individual with known risks for hepatitis C.  Abdominal aortic aneurysm (AAA) screening.** / A one-time screening for ages 50 to 27 years who are current or former smokers.  Skin self-exam. / Monthly.  Influenza vaccine. / Every year.  Tetanus, diphtheria, and acellular pertussis (Tdap/Td) vaccine.** / 1 dose of Td every 10 years.  Varicella vaccine.** / Consult your health care provider.  Zoster vaccine.** / 1 dose for adults aged 34 years or older.  Pneumococcal 13-valent conjugate (PCV13) vaccine.** / Consult your health care provider.  Pneumococcal polysaccharide (PPSV23) vaccine.** / 1 dose for all adults aged 63 years and older.  Meningococcal vaccine.** / Consult your health care provider.  Hepatitis A vaccine.** / Consult your health care provider.  Hepatitis B vaccine.** / Consult your health care provider.  Haemophilus influenzae type b (Hib) vaccine.** / Consult your health care provider. **Family history and personal history of risk and conditions may change your health care provider's recommendations. Document Released: 04/11/2001 Document Revised: 02/18/2013 Document Reviewed: 07/11/2010 [facility] Patient Information 2015 [place], [place]. This information is not intended to replace advice given to you by your health care provider. Make sure you discuss any questions you have with your health care provider.

## 2014-03-06 NOTE — Progress Notes (Signed)
Subjective:    Rick [name] is a 81 y.o. male who presents for Medicare Annual/Subsequent preventive examination.   Preventive Screening-Counseling & Management  Tobacco History  Smoking status  . Current Every Day Smoker -- 0.50 packs/day for 50 years  . Types: Cigarettes  Smokeless tobacco  . Not on file    Problems Prior to Visit 1. none  Current Problems (verified) Patient Active Problem List   Diagnosis Date Noted  . Carotid bruit 10/10/2010  . Preventative health care 10/10/2010  . RIB PAIN, RIGHT SIDED 10/11/2009  . UTI 09/28/2009  . TOBACCO USER 09/23/2008  . OSTEOPENIA 05/10/2008  . BACK PAIN, ACUTE 05/08/2008  . KNEE SPRAIN, LEFT, MEDIAL COLLATERAL LIGAMENT 01/02/2007  . TESTOSTERONE DEFICIENCY 10/02/2006  . HYPERLIPIDEMIA NEC/NOS 08/01/2006  . ERECTILE DYSFUNCTION 08/01/2006  . BENIGN PROSTATIC HYPERTROPHY, HX OF 08/01/2006  . HYPERTENSION 07/27/2006  . CAROTID STENOSIS 07/27/2006  . MICROSCOPIC HEMATURIA 07/27/2006  . OSTEOPOROSIS 07/27/2006  . COLONIC POLYPS, HX OF 07/27/2006    Medications Prior to Visit Current Outpatient Prescriptions on File Prior to Visit  Medication Sig Dispense Refill  . atorvastatin (LIPITOR) 20 MG tablet Take 1 tablet (20 mg total) by mouth daily. Labs are due now 90 tablet 0  . Calcium-Vitamin D (CALTRATE 600 PLUS-VIT D PO) Take 600 mg by mouth 2 (two) times daily.     . clopidogrel (PLAVIX) 75 MG tablet Take 1 tablet (75 mg total) by mouth daily. 90 tablet 0  . cyanocobalamin 100 MCG tablet Take 100 mcg by mouth daily.    . MULTIPLE VITAMIN PO Take by mouth.      . Saw Palmetto 450 MG CAPS Take by mouth.      . sildenafil (VIAGRA) 100 MG tablet Take 100 mg by mouth daily as needed.      Marland Kitchen VITAMIN D, CHOLECALCIFEROL, PO Take by mouth.       No current facility-administered medications on file prior to visit.    Current Medications (verified) Current Outpatient Prescriptions  Medication Sig Dispense Refill  .  atorvastatin (LIPITOR) 20 MG tablet Take 1 tablet (20 mg total) by mouth daily. Labs are due now 90 tablet 0  . Calcium-Vitamin D (CALTRATE 600 PLUS-VIT D PO) Take 600 mg by mouth 2 (two) times daily.     . clopidogrel (PLAVIX) 75 MG tablet Take 1 tablet (75 mg total) by mouth daily. 90 tablet 0  . cyanocobalamin 100 MCG tablet Take 100 mcg by mouth daily.    . MULTIPLE VITAMIN PO Take by mouth.      . Saw Palmetto 450 MG CAPS Take by mouth.      . sildenafil (VIAGRA) 100 MG tablet Take 100 mg by mouth daily as needed.      Marland Kitchen VITAMIN D, CHOLECALCIFEROL, PO Take by mouth.       No current facility-administered medications for this visit.     Allergies (verified) Aspirin and Penicillins   PAST HISTORY  Family History Family History  Problem Relation Age of Onset  . Stroke Mother   . Seizures Father     Social History History  Substance Use Topics  . Smoking status: Current Every Day Smoker -- 0.50 packs/day for 50 years    Types: Cigarettes  . Smokeless tobacco: Not on file  . Alcohol Use: 0.0 oz/week    Are there smokers in your home (other than you)?  No  Risk Factors Current exercise habits: walking  Dietary issues discussed: na  Cardiac risk factors: advanced age (older than 14 for men, 45 for women), dyslipidemia, hypertension and male gender.  Depression Screen (Note: if answer to either of the following is "Yes", a more complete depression screening is indicated)   Q1: Over the past two weeks, have you felt down, depressed or hopeless? No  Q2: Over the past two weeks, have you felt little interest or pleasure in doing things? No  Have you lost interest or pleasure in daily life? No  Do you often feel hopeless? No  Do you cry easily over simple problems? No  Activities of Daily Living In your present state of health, do you have any difficulty performing the following activities?:  Driving? No Managing money?  No Feeding yourself? No Getting from bed to  chair? No Climbing a flight of stairs? No Preparing food and eating?: No Bathing or showering? No Getting dressed: No Getting to the toilet? No Using the toilet:No Moving around from place to place: No In the past year have you fallen or had a near fall?:No   Are you sexually active?  Yes  Do you have more than one partner?  No  Hearing Difficulties: No Do you often ask people to speak up or repeat themselves? No Do you experience ringing or noises in your ears? No Do you have difficulty understanding soft or whispered voices? No   Do you feel that you have a problem with memory? No  Do you often misplace items? No  Do you feel safe at home?  No  Cognitive Testing  Alert? Yes  Normal Appearance?Yes  Oriented to person? Yes  Place? Yes   Time? Yes  Recall of three objects?  Yes  Can perform simple calculations? Yes  Displays appropriate judgment?Yes  Can read the correct time from a watch face?Yes   Advanced Directives have been discussed with the patient? Yes   List the Names of Other Physician/Practitioners you currently use: 1.  opth-- digby 2.  Dentist-- benson 3.  Derm--gould 4.  Vascular-- Freida Busman any recent Medical Services you may have received from other than [facility] providers in the past year (date may be approximate).  Immunization History  Administered Date(s) Administered  . Influenza Whole 11/28/1998, 12/25/2006, 12/09/2008, 11/14/2010, 11/20/2011, 11/20/2012  . Influenza-Unspecified 01/11/2014  . Pneumococcal Polysaccharide-23 05/22/2002  . Td 12/01/1996, 03/15/2005  . Zoster 09/13/2005, 03/15/2006, 05/29/2006    Screening Tests Health Maintenance  Topic Date Due  . INFLUENZA VACCINE  09/28/2014  . TETANUS/TDAP  03/16/2015  . COLONOSCOPY  09/24/2018  . PNEUMOCOCCAL POLYSACCHARIDE VACCINE AGE 10 AND OVER  Completed  . ZOSTAVAX  Completed    All answers were reviewed with the patient and necessary referrals were made:  Garnet Koyanagi,  DO   03/06/2014   History reviewed:  He  has a past medical history of colonic polyps; Hypertension; Kidney stones; Chronic ear infection; Hyperlipidemia; BPH (benign prostatic hyperplasia); Osteoporosis; and Carotid artery occlusion. He  does not have any pertinent problems on file. He  has past surgical history that includes Tonsillectomy. His family history includes Seizures in his father; Stroke in his mother. He  reports that he has been smoking Cigarettes.  He has a 25 pack-year smoking history. He does not have any smokeless tobacco history on file. He reports that he drinks alcohol. He reports that he does not use illicit drugs. He has a current medication list which includes the following prescription(s): atorvastatin, calcium-vitamin d, clopidogrel, cyanocobalamin, multiple vitamin, saw palmetto,  sildenafil, and cholecalciferol. Current Outpatient Prescriptions on File Prior to Visit  Medication Sig Dispense Refill  . atorvastatin (LIPITOR) 20 MG tablet Take 1 tablet (20 mg total) by mouth daily. Labs are due now 90 tablet 0  . Calcium-Vitamin D (CALTRATE 600 PLUS-VIT D PO) Take 600 mg by mouth 2 (two) times daily.     . clopidogrel (PLAVIX) 75 MG tablet Take 1 tablet (75 mg total) by mouth daily. 90 tablet 0  . cyanocobalamin 100 MCG tablet Take 100 mcg by mouth daily.    . MULTIPLE VITAMIN PO Take by mouth.      . Saw Palmetto 450 MG CAPS Take by mouth.      . sildenafil (VIAGRA) 100 MG tablet Take 100 mg by mouth daily as needed.      Marland Kitchen VITAMIN D, CHOLECALCIFEROL, PO Take by mouth.       No current facility-administered medications on file prior to visit.   He is allergic to aspirin and penicillins.  Review of Systems  Review of Systems  Constitutional: Negative for activity change, appetite change and fatigue.  HENT: Negative for hearing loss, congestion, tinnitus and ear discharge.   Eyes: Negative for visual disturbance (see optho q1y -- vision corrected to 20/20 with  glasses).  Respiratory: Negative for cough, chest tightness and shortness of breath.   Cardiovascular: Negative for chest pain, palpitations and leg swelling.  Gastrointestinal: Negative for abdominal pain, diarrhea, constipation and abdominal distention.  Genitourinary: Negative for urgency, frequency, decreased urine volume and difficulty urinating.  Musculoskeletal: Negative for back pain, arthralgias and gait problem.  Skin: Negative for color change, pallor and rash.  Neurological: Negative for dizziness, light-headedness, numbness and headaches.  Hematological: Negative for adenopathy. Does not bruise/bleed easily.  Psychiatric/Behavioral: Negative for suicidal ideas, confusion, sleep disturbance, self-injury, dysphoric mood, decreased concentration and agitation.  Pt is able to read and write and can do all ADLs No risk for falling No abuse/ violence in home      Objective:     Vision by Snellen chart: opth Blood pressure 137/67, pulse 61, temperature 97.4 F (36.3 C), temperature source Oral, resp. rate 16, height 6' 1.5" (1.867 m), weight 131 lb 9.6 oz (59.693 kg), SpO2 100 %. Body mass index is 17.13 kg/(m^2).  BP 137/67 mmHg  Pulse 61  Temp(Src) 97.4 F (36.3 C) (Oral)  Resp 16  Ht 6' 1.5" (1.867 m)  Wt 131 lb 9.6 oz (59.693 kg)  BMI 17.13 kg/m2  SpO2 100% General appearance: alert, cooperative, appears stated age and no distress Head: Normocephalic, without obvious abnormality, atraumatic Eyes: conjunctivae/corneas clear. PERRL, EOM's intact. Fundi benign. Ears: normal TM's and external ear canals both ears Nose: Nares normal. Septum midline. Mucosa normal. No drainage or sinus tenderness. Throat: lips, mucosa, and tongue normal; teeth and gums normal Neck: no adenopathy, no carotid bruit, no JVD, supple, symmetrical, trachea midline and thyroid not enlarged, symmetric, no tenderness/mass/nodules Back: symmetric, no curvature. ROM normal. No CVA tenderness. Lungs:  clear to auscultation bilaterally Chest wall: no tenderness Heart: regular rate and rhythm, S1, S2 normal, no murmur, click, rub or gallop Abdomen: soft, non-tender; bowel sounds normal; no masses,  no organomegaly Male genitalia: normal, penis: no lesions or discharge. testes: no masses or tenderness. no hernias Rectal: soft brown guaiac negative stool noted and enlarged Extremities: extremities normal, atraumatic, no cyanosis or edema Pulses: 2+ and symmetric Skin: Skin color, texture, turgor normal. No rashes or lesions Lymph nodes: Cervical, supraclavicular, and axillary nodes normal.  Neurologic: Alert and oriented X 3, normal strength and tone. Normal symmetric reflexes. Normal coordination and gait     Assessment:     cpe      Plan:     During the course of the visit the patient was educated and counseled about appropriate screening and preventive services including:    Pneumococcal vaccine   Influenza vaccine  Prostate cancer screening  Colorectal cancer screening  Diabetes screening  Glaucoma screening  Advanced directives: has an advanced directive - a copy HAS NOT been provided.  Diet review for nutrition referral? Yes ____  Not Indicated __x__   Patient Instructions (the written plan) was given to the patient.  Medicare Attestation I have personally reviewed: The patient's medical and social history Their use of alcohol, tobacco or illicit drugs Their current medications and supplements The patient's functional ability including ADLs,fall risks, home safety risks, cognitive, and hearing and visual impairment Diet and physical activities Evidence for depression or mood disorders  The patient's weight, height, BMI, and visual acuity have been recorded in the chart.  I have made referrals, counseling, and provided education to the patient based on review of the above and I have provided the patient with a written personalized care plan for preventive  services.      1. Hyperlipidemia Check labs , con't lipitor - Hepatic function panel - Lipid panel  2. Essential hypertension  - Basic metabolic panel - CBC with Differential - POCT urinalysis dipstick  3. Erectile dysfunction due to diseases classified elsewhere  - PSA  4. Preventative health care \  5. BPH (benign prostatic hypertrophy)  - PSA  6. Need for prophylactic vaccination against Streptococcus pneumoniae (pneumococcus)   - Pneumococcal conjugate vaccine 13-valent   Garnet Koyanagi, DO   03/06/2014

## 2014-03-06 NOTE — Assessment & Plan Note (Signed)
Per vascular  

## 2014-03-09 ENCOUNTER — Telehealth: Payer: Self-pay | Admitting: Family Medicine

## 2014-03-09 NOTE — Telephone Encounter (Signed)
emmi mailed  °

## 2014-04-01 ENCOUNTER — Other Ambulatory Visit: Payer: Self-pay | Admitting: Family Medicine

## 2014-04-14 DIAGNOSIS — L570 Actinic keratosis: Secondary | ICD-10-CM | POA: Diagnosis not present

## 2014-04-14 DIAGNOSIS — C44229 Squamous cell carcinoma of skin of left ear and external auricular canal: Secondary | ICD-10-CM | POA: Diagnosis not present

## 2014-04-14 DIAGNOSIS — L905 Scar conditions and fibrosis of skin: Secondary | ICD-10-CM | POA: Diagnosis not present

## 2014-06-01 ENCOUNTER — Other Ambulatory Visit (HOSPITAL_COMMUNITY): Payer: Medicare Other

## 2014-06-01 ENCOUNTER — Ambulatory Visit: Payer: Medicare Other | Admitting: Family

## 2014-06-02 ENCOUNTER — Other Ambulatory Visit (HOSPITAL_COMMUNITY): Payer: Medicare Other

## 2014-06-02 ENCOUNTER — Ambulatory Visit: Payer: Medicare Other | Admitting: Family

## 2014-06-03 ENCOUNTER — Telehealth: Payer: Self-pay | Admitting: *Deleted

## 2014-06-03 NOTE — Telephone Encounter (Signed)
-----   Message from Rufina Falco sent at 06/03/2014 11:55 AM EDT ----- Regarding: Orders for Carotid Ultrasound Dr. Kellie Simmering ordered a carotid ultrasound which was scheduled here on 06/05/14.  At some point and for some unknown reason the carotid ultrasound was cancelled by the staff at [facility] and was rescheduled for the ultrasound to be performed at [facility] on 06/08/14.   The follow-up appointment was not cancelled on 06/05/14 which would have been a wasted appointment for [name].    The vascular lab received a phone call from a technician at [facility] stating that the carotid was on their schedule (she had knowledge that our doctors order their studies to be performed here in our vascular lab at [place], so she was questioning as to why this was being performed there, or was there a mistake.)  I called [name] he claimed he didn't know he was scheduled at [facility] but he would prefer to go there since it's more convenient.  I explained that he would have to see the NP or Dr. Kellie Simmering to obtain the results.    He said he didn't want to go two separate places, he rather have one visit at one location.    I attempted to explain that I would need to reschedule his carotid and visit with the NP to different date of 04/08 in order to accommodate his wishes.    He said for me to cancel everything that he would call me back.    [name] didn't call me back but is now back on the scheduled at [place] for a carotid ultrasound on 04/11 @2 :30 pm.    At some point he may call for results so I'm giving you a heads up.   (will you please save this encounter to the patient's chart)  Rip Harbour

## 2014-06-03 NOTE — Telephone Encounter (Signed)
See the note below, the original order was to [facility] in conjunction with a NP appt to explain results. We are investigating how this was scheduled at [facility] again on 06-08-14. We will look at the workflow to determine if any changes need to be made. The location has been changed on the order from Internal to [place] with my name still attached as who put the order in EPIC. All of my vascular lab orders default to location: Internal, which is our lab here at [facility]. I do not know why my name is still attached to this order if someone changed the location.

## 2014-06-05 ENCOUNTER — Ambulatory Visit: Payer: Medicare Other | Admitting: Family

## 2014-06-05 ENCOUNTER — Other Ambulatory Visit (HOSPITAL_COMMUNITY): Payer: Medicare Other

## 2014-06-08 ENCOUNTER — Ambulatory Visit (HOSPITAL_BASED_OUTPATIENT_CLINIC_OR_DEPARTMENT_OTHER)
Admission: RE | Admit: 2014-06-08 | Discharge: 2014-06-08 | Disposition: A | Discharge status: Home / Self Care | Payer: Medicare Other | Source: Ambulatory Visit | Attending: Vascular Surgery | Admitting: Vascular Surgery

## 2014-06-08 ENCOUNTER — Ambulatory Visit (HOSPITAL_BASED_OUTPATIENT_CLINIC_OR_DEPARTMENT_OTHER): Admission: RE | Admit: 2014-06-08 | Payer: Medicare Other | Source: Ambulatory Visit

## 2014-06-08 DIAGNOSIS — I6523 Occlusion and stenosis of bilateral carotid arteries: Secondary | ICD-10-CM | POA: Insufficient documentation

## 2014-06-08 DIAGNOSIS — I6522 Occlusion and stenosis of left carotid artery: Secondary | ICD-10-CM | POA: Diagnosis not present

## 2014-06-08 DIAGNOSIS — Z87891 Personal history of nicotine dependence: Secondary | ICD-10-CM | POA: Diagnosis not present

## 2014-06-09 DIAGNOSIS — H2513 Age-related nuclear cataract, bilateral: Secondary | ICD-10-CM | POA: Diagnosis not present

## 2014-06-17 DIAGNOSIS — B079 Viral wart, unspecified: Secondary | ICD-10-CM | POA: Diagnosis not present

## 2014-06-17 DIAGNOSIS — L570 Actinic keratosis: Secondary | ICD-10-CM | POA: Diagnosis not present

## 2014-06-17 DIAGNOSIS — L821 Other seborrheic keratosis: Secondary | ICD-10-CM | POA: Diagnosis not present

## 2014-06-17 DIAGNOSIS — Z85828 Personal history of other malignant neoplasm of skin: Secondary | ICD-10-CM | POA: Diagnosis not present

## 2014-06-22 ENCOUNTER — Encounter: Payer: Self-pay | Admitting: Vascular Surgery

## 2014-06-23 ENCOUNTER — Encounter: Payer: Self-pay | Admitting: Vascular Surgery

## 2014-06-23 ENCOUNTER — Ambulatory Visit (INDEPENDENT_AMBULATORY_CARE_PROVIDER_SITE_OTHER): Payer: Medicare Other | Admitting: Vascular Surgery

## 2014-06-23 VITALS — BP 113/44 | HR 133 | Resp 16 | Ht 74.0 in | Wt 135.0 lb

## 2014-06-23 DIAGNOSIS — I6523 Occlusion and stenosis of bilateral carotid arteries: Secondary | ICD-10-CM

## 2014-06-23 NOTE — Progress Notes (Signed)
Subjective:     Patient ID: Rick [name], male   DOB: 13-Sep-1933, 81 y.o.   MRN: [id number]  HPI this 81 year old male is seen today for follow-up regarding his carotid occlusive disease. I had followed a mild to moderate left ICA stenosis for the past 13 years. He remains asymptomatic. He denies lateralizing weakness, aphasia, amaurosis fugax, diplopia, blurred vision, or syncope. He is unable to take aspirin because of an allergy but does take Plavix on a daily basis.  Past Medical History  Diagnosis Date  . Hx of colonic polyps   . Hypertension   . Kidney stones   . Chronic ear infection     left  . Hyperlipidemia   . BPH (benign prostatic hyperplasia)   . Osteoporosis   . Carotid artery occlusion     History  Substance Use Topics  . Smoking status: Light Tobacco Smoker -- 0.50 packs/day for 50 years    Types: Cigarettes  . Smokeless tobacco: Never Used  . Alcohol Use: No    Family History  Problem Relation Age of Onset  . Stroke Mother   . Seizures Father     Allergies  Allergen Reactions  . Aspirin Hives and Itching  . Penicillins Hives and Itching     Current outpatient prescriptions:  .  atorvastatin (LIPITOR) 20 MG tablet, TAKE 1 TABLET BY MOUTH DAILY (LABS DUE), Disp: 90 tablet, Rfl: 1 .  Calcium-Vitamin D (CALTRATE 600 PLUS-VIT D PO), Take 600 mg by mouth 2 (two) times daily. , Disp: , Rfl:  .  clopidogrel (PLAVIX) 75 MG tablet, TAKE 1 TABLET BY MOUTH EVERY DAY, Disp: 90 tablet, Rfl: 1 .  cyanocobalamin 100 MCG tablet, Take 100 mcg by mouth daily., Disp: , Rfl:  .  MULTIPLE VITAMIN PO, Take by mouth.  , Disp: , Rfl:  .  Saw Palmetto 450 MG CAPS, Take by mouth.  , Disp: , Rfl:  .  sildenafil (VIAGRA) 100 MG tablet, Take 100 mg by mouth daily as needed.  , Disp: , Rfl:  .  VITAMIN D, CHOLECALCIFEROL, PO, Take by mouth.  , Disp: , Rfl:   Filed Vitals:   06/23/14 1556 06/23/14 1559  BP: 118/53 113/44  Pulse: 132 133  Resp:  16  Height:  6\' 2"  (1.88 m)   Weight:  135 lb (61.236 kg)  SpO2:  97%    Body mass index is 17.33 kg/(m^2).           Review of Systems denies chest pain, dyspnea on exertion, PND, orthopnea, hemoptysis, claudication.     Objective:   Physical Exam BP 113/44 mmHg  Pulse 133  Resp 16  Ht 6\' 2"  (1.88 m)  Wt 135 lb (61.236 kg)  BMI 17.33 kg/m2  SpO2 97%  Gen.-alert and oriented x3 in no apparent distress HEENT normal for age Lungs no rhonchi or wheezing Cardiovascular regular rhythm no murmurs carotid pulses 3+ palpable-left carotid bruit audible Abdomen soft nontender no palpable masses Musculoskeletal free of  major deformities Skin clear -no rashes Neurologic normal Lower extremities 3+ femoral and dorsalis pedis pulses palpable bilaterally with no edema  Today I reviewed the carotid duplex exam done at [facility] on 06/08/2014 There is no change in the left ICA stenosis which approximates 50% in the right ICA stenosis approximating 40%      Assessment:     Stable bilateral ICA stenosis left side 50%-asymptomatic    Plan:     Return in 1 year with  carotid duplex exam and see nurse practitioner unless patient develops symptoms in the interim Continue daily Plavix

## 2014-06-25 NOTE — Addendum Note (Signed)
Addended by: Mena Goes on: 06/25/2014 03:11 PM   Modules accepted: Orders

## 2014-07-07 ENCOUNTER — Telehealth: Payer: Self-pay | Admitting: Family Medicine

## 2014-07-07 DIAGNOSIS — E785 Hyperlipidemia, unspecified: Secondary | ICD-10-CM

## 2014-07-07 NOTE — Telephone Encounter (Signed)
LM advising pt of MD instructions awaiting call back

## 2014-07-07 NOTE — Telephone Encounter (Signed)
prolia scheduled for 08/10/14. Please enter orders.

## 2014-07-07 NOTE — Telephone Encounter (Signed)
Schedule Prolia after 08/06/14.    The labs are to recheck his cholesterol and liver functions   KP

## 2014-07-07 NOTE — Telephone Encounter (Signed)
Relation to pt: self  Call back number: [phone]   Reason for call:  Pt requesting to schedule prolia injection and inquiring about lab orders placed does not know why he has to take labs, lab appointment was scheduled for July. Please advise

## 2014-07-08 DIAGNOSIS — R972 Elevated prostate specific antigen [PSA]: Secondary | ICD-10-CM | POA: Diagnosis not present

## 2014-07-16 DIAGNOSIS — R972 Elevated prostate specific antigen [PSA]: Secondary | ICD-10-CM | POA: Diagnosis not present

## 2014-07-16 DIAGNOSIS — N401 Enlarged prostate with lower urinary tract symptoms: Secondary | ICD-10-CM | POA: Diagnosis not present

## 2014-07-29 ENCOUNTER — Telehealth: Payer: Self-pay | Admitting: Family Medicine

## 2014-07-29 NOTE — Telephone Encounter (Signed)
FYI-  Pt scheduled prolia injection for 08/11/2014

## 2014-08-05 ENCOUNTER — Other Ambulatory Visit: Payer: Self-pay

## 2014-08-05 DIAGNOSIS — M810 Age-related osteoporosis without current pathological fracture: Secondary | ICD-10-CM

## 2014-08-05 NOTE — Telephone Encounter (Signed)
Prolia benefits verified.No PA needed. Coverage for in network benefits. The provider is in network for this patients plan. benefits subject to a $166 deductible ($166 met) 20% co-insurance for the administration and cost of Prolia.  Secondary coverage: Coverage is for in network. The provider is in network for this patient's plan. The secondary plan will coordinate benefits and will consider the patient's Medicare Part B insert deductible, co-insurance amounts up to primary plans allowed amount. This plan does not follow Medicare Guidelines. Claims will crossover.     KP

## 2014-08-11 ENCOUNTER — Ambulatory Visit (INDEPENDENT_AMBULATORY_CARE_PROVIDER_SITE_OTHER): Payer: Medicare Other | Admitting: *Deleted

## 2014-08-11 DIAGNOSIS — M810 Age-related osteoporosis without current pathological fracture: Secondary | ICD-10-CM

## 2014-08-11 MED ORDER — DENOSUMAB 60 MG/ML ~~LOC~~ SOLN
60.0000 mg | Freq: Once | SUBCUTANEOUS | Status: AC
  Administered 2014-08-11: 60 mg via SUBCUTANEOUS

## 2014-08-11 NOTE — Progress Notes (Signed)
Pre visit review using our clinic review tool, if applicable. No additional management support is needed unless otherwise documented below in the visit note. 

## 2014-09-04 ENCOUNTER — Other Ambulatory Visit (INDEPENDENT_AMBULATORY_CARE_PROVIDER_SITE_OTHER): Payer: Medicare Other

## 2014-09-04 DIAGNOSIS — M810 Age-related osteoporosis without current pathological fracture: Secondary | ICD-10-CM

## 2014-09-04 DIAGNOSIS — E785 Hyperlipidemia, unspecified: Secondary | ICD-10-CM

## 2014-09-04 LAB — HEPATIC FUNCTION PANEL
ALK PHOS: 80 U/L (ref 39–117)
ALT: 12 U/L (ref 0–53)
AST: 17 U/L (ref 0–37)
Albumin: 3.9 g/dL (ref 3.5–5.2)
BILIRUBIN DIRECT: 0.1 mg/dL (ref 0.0–0.3)
Total Bilirubin: 0.6 mg/dL (ref 0.2–1.2)
Total Protein: 6.9 g/dL (ref 6.0–8.3)

## 2014-09-04 LAB — BASIC METABOLIC PANEL
BUN: 11 mg/dL (ref 6–23)
CALCIUM: 9.6 mg/dL (ref 8.4–10.5)
CO2: 31 mEq/L (ref 19–32)
Chloride: 103 mEq/L (ref 96–112)
Creatinine, Ser: 0.83 mg/dL (ref 0.40–1.50)
GFR: 94.9 mL/min (ref >60.00)
GLUCOSE: 84 mg/dL (ref 70–99)
POTASSIUM: 4.1 meq/L (ref 3.5–5.1)
Sodium: 139 mEq/L (ref 135–145)

## 2014-09-04 LAB — LIPID PANEL
CHOL/HDL RATIO: 3
Cholesterol: 140 mg/dL (ref 0–200)
HDL: 48.5 mg/dL (ref >39.00)
LDL Cholesterol: 75 mg/dL (ref 0–99)
NonHDL: 91.5
Triglycerides: 84 mg/dL (ref 0.0–149.0)
VLDL: 16.8 mg/dL (ref 0.0–40.0)

## 2014-09-14 ENCOUNTER — Ambulatory Visit (INDEPENDENT_AMBULATORY_CARE_PROVIDER_SITE_OTHER): Payer: Medicare Other | Admitting: Family Medicine

## 2014-09-14 ENCOUNTER — Encounter: Payer: Self-pay | Admitting: Family Medicine

## 2014-09-14 VITALS — BP 126/68 | HR 59 | Temp 97.9°F | Ht 74.0 in | Wt 132.8 lb

## 2014-09-14 DIAGNOSIS — I6523 Occlusion and stenosis of bilateral carotid arteries: Secondary | ICD-10-CM | POA: Diagnosis not present

## 2014-09-14 DIAGNOSIS — E785 Hyperlipidemia, unspecified: Secondary | ICD-10-CM

## 2014-09-14 DIAGNOSIS — I6529 Occlusion and stenosis of unspecified carotid artery: Secondary | ICD-10-CM | POA: Diagnosis not present

## 2014-09-14 DIAGNOSIS — L84 Corns and callosities: Secondary | ICD-10-CM | POA: Diagnosis not present

## 2014-09-14 NOTE — Progress Notes (Signed)
Pre visit review using our clinic review tool, if applicable. No additional management support is needed unless otherwise documented below in the visit note. 

## 2014-09-14 NOTE — Progress Notes (Signed)
Subjective:    Patient ID: Rick [name], male    DOB: 06-Oct-1933, 81 y.o.   MRN: [id number]  HPI  Patient here for f/u cholesterol and bp.   No complaints.  meds are on auto refill.   Pt c/o another callous on feet and he would like to know where he went in the past.    Past Medical History  Diagnosis Date  . Hx of colonic polyps   . Hypertension   . Kidney stones   . Chronic ear infection     left  . Hyperlipidemia   . BPH (benign prostatic hyperplasia)   . Osteoporosis   . Carotid artery occlusion     Review of Systems  Constitutional: Negative for diaphoresis, appetite change, fatigue and unexpected weight change.  Eyes: Negative for pain, redness and visual disturbance.  Respiratory: Negative for cough, chest tightness, shortness of breath and wheezing.   Cardiovascular: Negative for chest pain, palpitations and leg swelling.  Endocrine: Negative for cold intolerance, heat intolerance, polydipsia, polyphagia and polyuria.  Genitourinary: Negative for dysuria, frequency and difficulty urinating.  Neurological: Negative for dizziness, light-headedness, numbness and headaches.  Psychiatric/Behavioral: Negative for decreased concentration. The patient is not nervous/anxious.     Current Outpatient Prescriptions on File Prior to Visit  Medication Sig Dispense Refill  . atorvastatin (LIPITOR) 20 MG tablet TAKE 1 TABLET BY MOUTH DAILY (LABS DUE) 90 tablet 1  . Calcium-Vitamin D (CALTRATE 600 PLUS-VIT D PO) Take 600 mg by mouth 2 (two) times daily.     . clopidogrel (PLAVIX) 75 MG tablet TAKE 1 TABLET BY MOUTH EVERY DAY 90 tablet 1  . cyanocobalamin 100 MCG tablet Take 100 mcg by mouth daily.    . MULTIPLE VITAMIN PO Take by mouth.      . Saw Palmetto 450 MG CAPS Take by mouth.      Marland Kitchen VITAMIN D, CHOLECALCIFEROL, PO Take by mouth.       No current facility-administered medications on file prior to visit.       Objective:    Physical Exam  Constitutional: He is  oriented to person, place, and time. Vital signs are normal. He appears well-developed and well-nourished. He is sleeping.  HENT:  Head: Normocephalic and atraumatic.  Mouth/Throat: Oropharynx is clear and moist.  Eyes: EOM are normal. Pupils are equal, round, and reactive to light.  Neck: Normal range of motion. Neck supple. No thyromegaly present.  Cardiovascular: Normal rate and regular rhythm.   No murmur heard. Pulmonary/Chest: Effort normal and breath sounds normal. No respiratory distress. He has no wheezes. He has no rales. He exhibits no tenderness.  Musculoskeletal: He exhibits no edema or tenderness.  Neurological: He is alert and oriented to person, place, and time.  Skin: Skin is warm and dry.  Psychiatric: He has a normal mood and affect. His behavior is normal. Judgment and thought content normal.    BP 126/68 mmHg  Pulse 59  Temp(Src) 97.9 F (36.6 C) (Oral)  Ht 6\' 2"  (1.88 m)  Wt 132 lb 12.8 oz (60.238 kg)  BMI 17.04 kg/m2  SpO2 97% Wt Readings from Last 3 Encounters:  09/14/14 132 lb 12.8 oz (60.238 kg)  06/23/14 135 lb (61.236 kg)  03/06/14 131 lb 9.6 oz (59.693 kg)     Lab Results  Component Value Date   WBC 9.1 03/06/2014   HGB 14.0 03/06/2014   HCT 43.2 03/06/2014   PLT 245.0 03/06/2014   GLUCOSE 84 09/04/2014  CHOL 140 09/04/2014   TRIG 84.0 09/04/2014   HDL 48.50 09/04/2014   LDLCALC 75 09/04/2014   ALT 12 09/04/2014   AST 17 09/04/2014   NA 139 09/04/2014   K 4.1 09/04/2014   CL 103 09/04/2014   CREATININE 0.83 09/04/2014   BUN 11 09/04/2014   CO2 31 09/04/2014   TSH 0.71 04/14/2010   PSA 5.00* 03/06/2014       Assessment & Plan:   Problem List Items Addressed This Visit    Hyperlipidemia    con't lipitor Lipid Panel     Component Value Date/Time   CHOL 140 09/04/2014 0824   TRIG 84.0 09/04/2014 0824   HDL 48.50 09/04/2014 0824   CHOLHDL 3 09/04/2014 0824   VLDL 16.8 09/04/2014 0824   LDLCALC 75 09/04/2014 0824          Carotid stenosis    con't plavix Per Vascular       Other Visit Diagnoses    Pre-ulcerative corn or callous    -  Primary    Relevant Orders    Ambulatory referral to Podiatry       I have discontinued [name] sildenafil. I am also having him maintain his Calcium-Vitamin D (CALTRATE 600 PLUS-VIT D PO), MULTIPLE VITAMIN PO, Saw Palmetto, (VITAMIN D, CHOLECALCIFEROL, PO), cyanocobalamin, clopidogrel, atorvastatin, denosumab, and Cranberry-Cholecalciferol.  Meds ordered this encounter  Medications  . denosumab (PROLIA) 60 MG/ML SOLN injection    Sig: Inject 60 mg into the skin every 6 (six) months. Administer in upper arm, thigh, or abdomen  . Cranberry-Cholecalciferol 4200-500 MG-UNIT CAPS    Sig: Take 1 capsule by mouth daily.     Garnet Koyanagi, DO

## 2014-09-14 NOTE — Patient Instructions (Signed)

## 2014-09-14 NOTE — Assessment & Plan Note (Signed)
con't lipitor Lipid Panel     Component Value Date/Time   CHOL 140 09/04/2014 0824   TRIG 84.0 09/04/2014 0824   HDL 48.50 09/04/2014 0824   CHOLHDL 3 09/04/2014 0824   VLDL 16.8 09/04/2014 0824   LDLCALC 75 09/04/2014 0824

## 2014-09-14 NOTE — Assessment & Plan Note (Signed)
con't plavix Per Vascular

## 2014-09-24 IMAGING — CT CT HEAD W/O CM
1 series · 16 of 30 positions shown, 20 images · non-contrast
Comparison: None.

CLINICAL DATA: Fall, laceration

CT HEAD WITHOUT CONTRAST
TECHNIQUE: Contiguous axial images were obtained from the base of
the skull through the vertex without contrast.

[Series 2: head 4.8 h37s · axial · 0.51mm/px · z∈[+1267,+1423]mm · 16 of 36 slices shown, 20 images]
[im 2/36  brain]
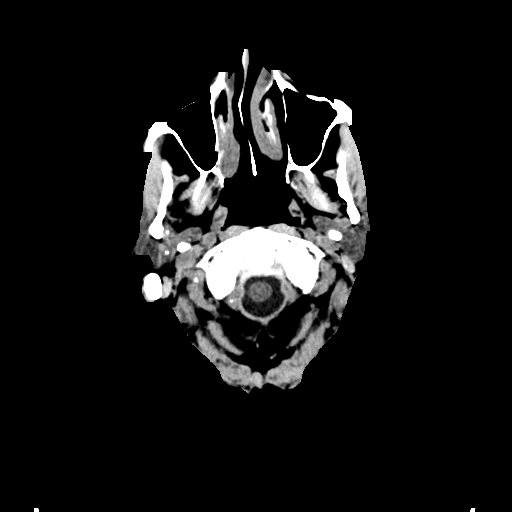
[im 2/36  bone]
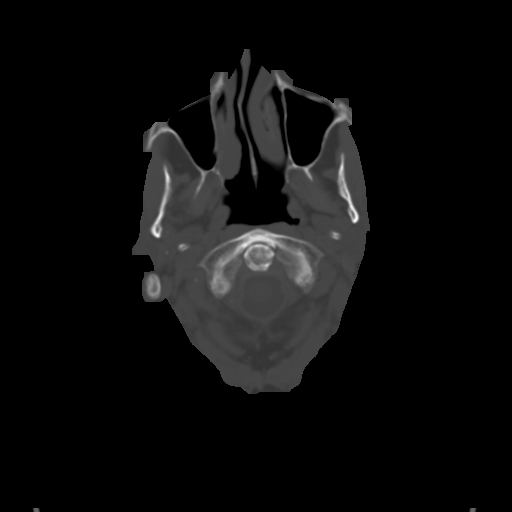
[im 4/36  brain]
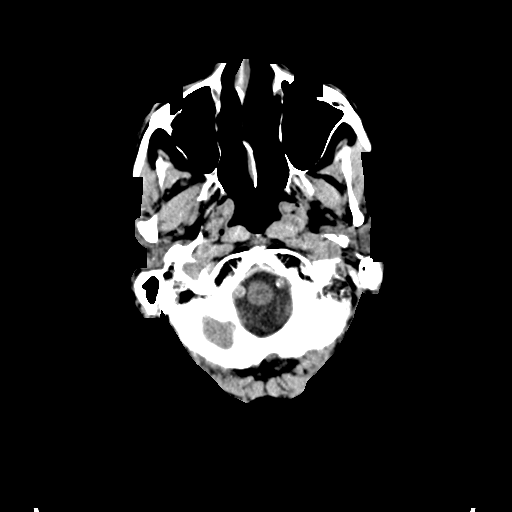
[im 7/36  brain]
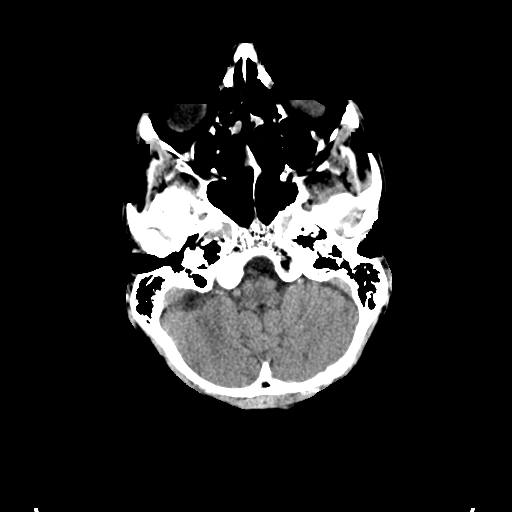
[im 9/36  brain]
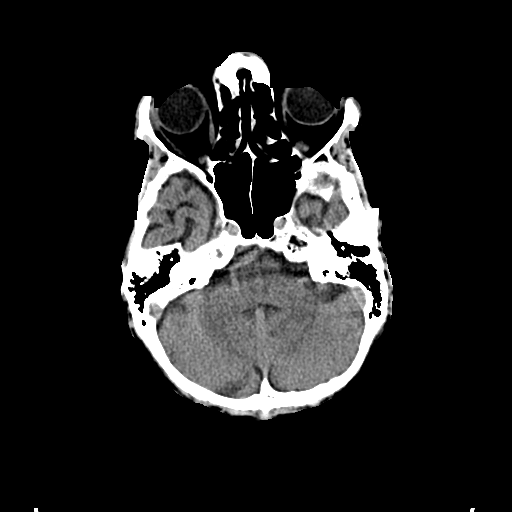
[im 10/36  brain]
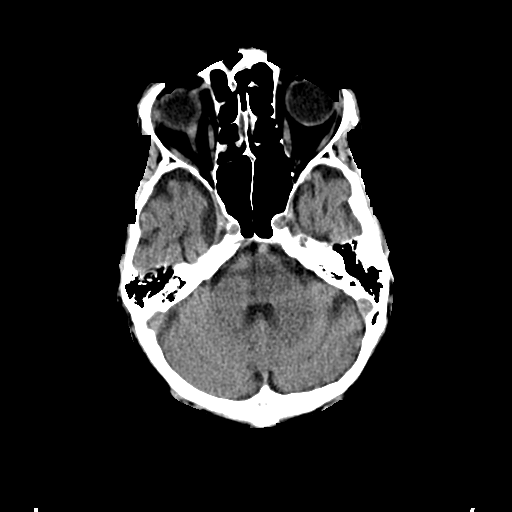
[im 10/36  bone]
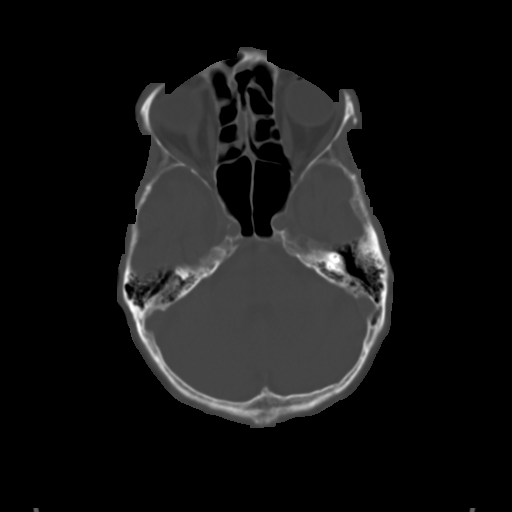
[im 13/36  brain]
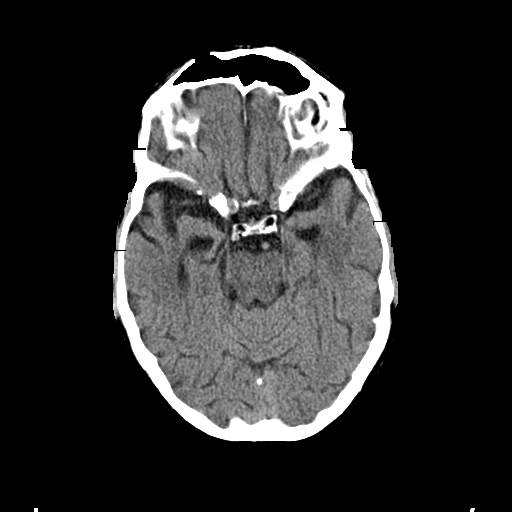
[im 15/36  brain]
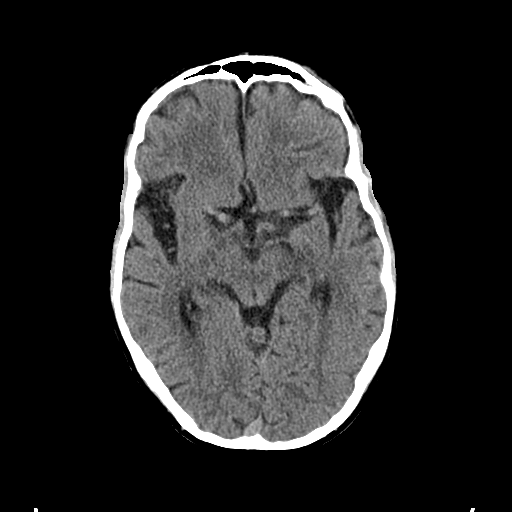
[im 17/36  brain]
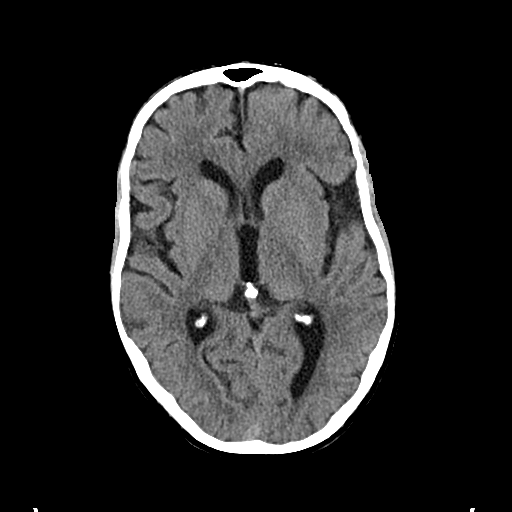
[im 19/36  brain]
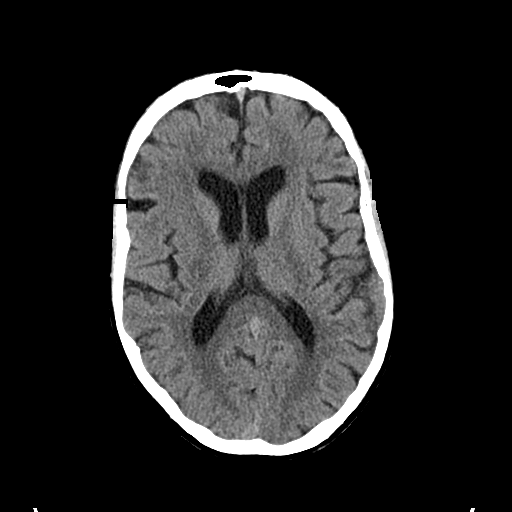
[im 19/36  bone]
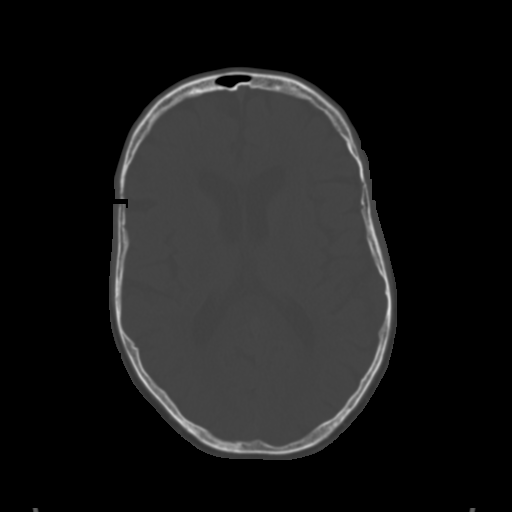
[im 21/36  brain]
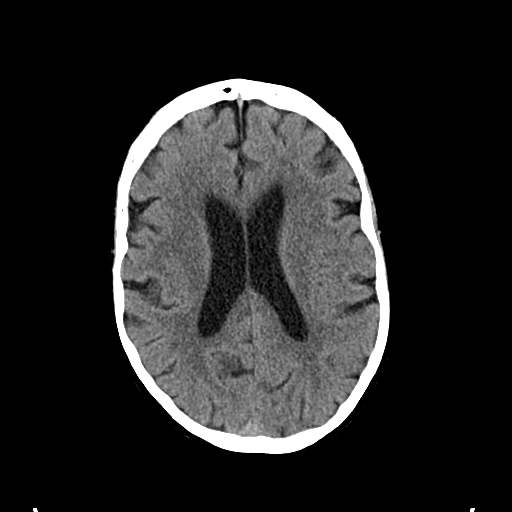
[im 23/36  brain]
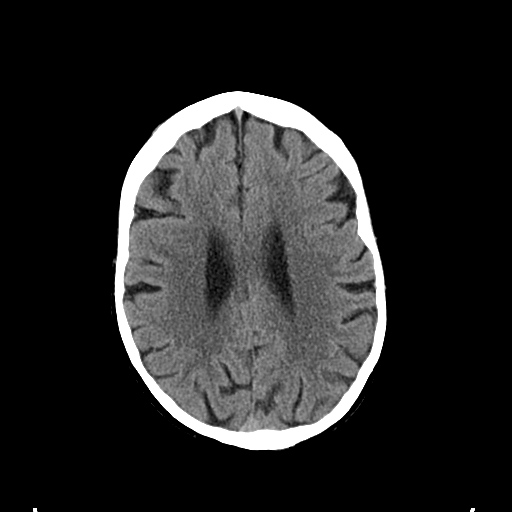
[im 26/36  brain]
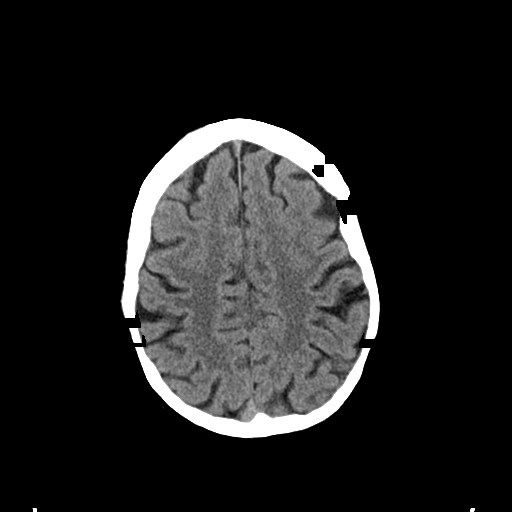
[im 27/36  brain]
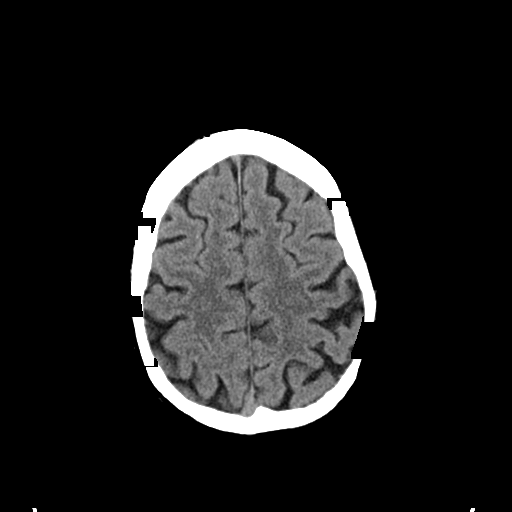
[im 27/36  bone]
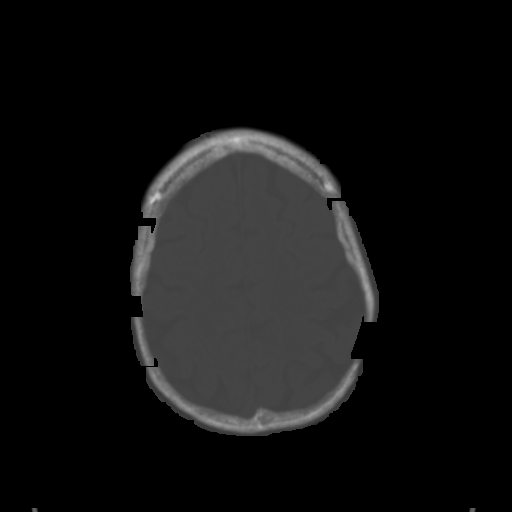
[im 29/36  brain]
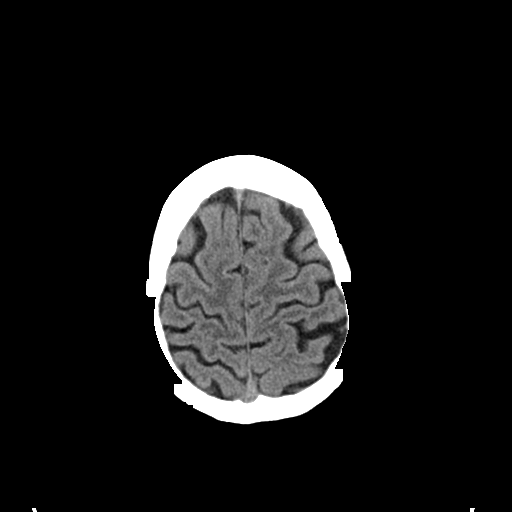
[im 32/36  brain]
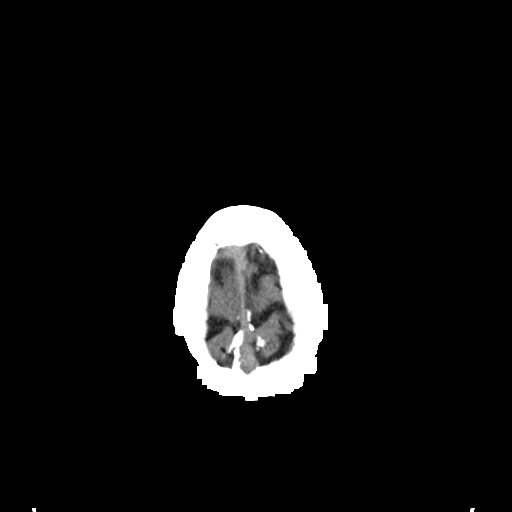
[im 34/36  brain]
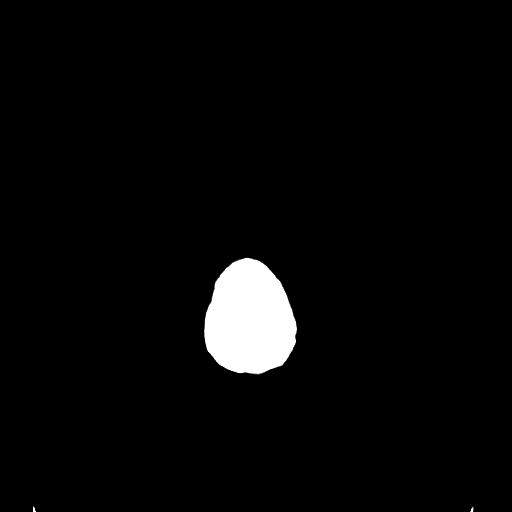

[16 of 30 positions shown; findings below may reference images not displayed]

FINDINGS: No acute intracranial hemorrhage.  No focal mass lesion.
No CT evidence of acute infarction.   No midline shift or mass
effect.  No hydrocephalus.  Basilar cisterns are patent.

There is generalized cortical atrophy and periventricular white
matter hypodensities..

Paranasal sinuses and mastoid air cells are clear.  Orbits are
normal.
IMPRESSION: 1..  No intracranial trauma.
2.  No skull fracture.
3.  Atrophy and microvascular disease.

## 2014-09-25 ENCOUNTER — Other Ambulatory Visit: Payer: Self-pay | Admitting: Family Medicine

## 2014-09-27 ENCOUNTER — Other Ambulatory Visit: Payer: Self-pay | Admitting: Family Medicine

## 2014-11-20 DIAGNOSIS — Z23 Encounter for immunization: Secondary | ICD-10-CM | POA: Diagnosis not present

## 2014-12-08 DIAGNOSIS — H25813 Combined forms of age-related cataract, bilateral: Secondary | ICD-10-CM | POA: Diagnosis not present

## 2014-12-08 DIAGNOSIS — H524 Presbyopia: Secondary | ICD-10-CM | POA: Diagnosis not present

## 2014-12-17 DIAGNOSIS — L821 Other seborrheic keratosis: Secondary | ICD-10-CM | POA: Diagnosis not present

## 2014-12-17 DIAGNOSIS — D225 Melanocytic nevi of trunk: Secondary | ICD-10-CM | POA: Diagnosis not present

## 2014-12-17 DIAGNOSIS — L570 Actinic keratosis: Secondary | ICD-10-CM | POA: Diagnosis not present

## 2014-12-17 DIAGNOSIS — Z85828 Personal history of other malignant neoplasm of skin: Secondary | ICD-10-CM | POA: Diagnosis not present

## 2015-02-05 ENCOUNTER — Telehealth: Payer: Self-pay | Admitting: Family Medicine

## 2015-02-05 DIAGNOSIS — M810 Age-related osteoporosis without current pathological fracture: Secondary | ICD-10-CM

## 2015-02-05 NOTE — Telephone Encounter (Signed)
Relation to pt:self °Call back number:[phone] ° ° °Reason for call:  °Patient would like to schedule prolia injection, please advise °

## 2015-02-08 NOTE — Telephone Encounter (Signed)
Please have him come in to complete his labs this week and schedule Prolia 1 week later.      KP

## 2015-02-08 NOTE — Telephone Encounter (Signed)
Patient scheduled labs only for 02/11/15 at 9am and prolia injection with the nurse 02/18/15 at 9:15am

## 2015-02-10 NOTE — Telephone Encounter (Signed)
Summary of Benefits: Primary Insurance: Benefits subject to a $166 deductible (met) and 20% co-insurance for the administration and cost of Prolia.  Secondary coverage:  The provider is in network for this patients plan. The secondary plan will coordinate benefits and will consider the patient's Medicare Part B insert deductible, co-insurance amounts up to the primary plans allowed amount. This plan does not follow Medicare guidelines. Claims will crossover. No referral is required.      KP

## 2015-02-10 NOTE — Addendum Note (Signed)
Addended by: Ewing Schlein on: 02/10/2015 09:34 AM   Modules accepted: Orders

## 2015-02-11 ENCOUNTER — Other Ambulatory Visit (INDEPENDENT_AMBULATORY_CARE_PROVIDER_SITE_OTHER): Payer: Medicare Other

## 2015-02-11 DIAGNOSIS — M810 Age-related osteoporosis without current pathological fracture: Secondary | ICD-10-CM | POA: Diagnosis not present

## 2015-02-11 LAB — BASIC METABOLIC PANEL
BUN: 11 mg/dL (ref 6–23)
CHLORIDE: 103 meq/L (ref 96–112)
CO2: 32 mEq/L (ref 19–32)
Calcium: 9.3 mg/dL (ref 8.4–10.5)
Creatinine, Ser: 0.88 mg/dL (ref 0.40–1.50)
GFR: 88.61 mL/min (ref >60.00)
Glucose, Bld: 88 mg/dL (ref 70–99)
Potassium: 3.9 mEq/L (ref 3.5–5.1)
SODIUM: 140 meq/L (ref 135–145)

## 2015-02-18 ENCOUNTER — Ambulatory Visit (INDEPENDENT_AMBULATORY_CARE_PROVIDER_SITE_OTHER): Payer: Medicare Other

## 2015-02-18 DIAGNOSIS — M810 Age-related osteoporosis without current pathological fracture: Secondary | ICD-10-CM | POA: Diagnosis not present

## 2015-02-18 MED ORDER — DENOSUMAB 60 MG/ML ~~LOC~~ SOLN
60.0000 mg | Freq: Once | SUBCUTANEOUS | Status: AC
  Administered 2015-02-18: 60 mg via SUBCUTANEOUS

## 2015-02-18 NOTE — Progress Notes (Signed)
Pre visit review using our clinic review tool, if applicable. No additional management support is needed unless otherwise documented below in the visit note.  Patient in for Prolia injection. Given  Right hip. Patient tolerated well.

## 2015-02-23 DIAGNOSIS — H7202 Central perforation of tympanic membrane, left ear: Secondary | ICD-10-CM | POA: Diagnosis not present

## 2015-03-19 ENCOUNTER — Ambulatory Visit (INDEPENDENT_AMBULATORY_CARE_PROVIDER_SITE_OTHER): Payer: Medicare Other | Admitting: Family Medicine

## 2015-03-19 ENCOUNTER — Encounter: Payer: Self-pay | Admitting: Family Medicine

## 2015-03-19 VITALS — BP 118/60 | HR 57 | Temp 97.7°F | Ht 74.0 in | Wt 133.4 lb

## 2015-03-19 DIAGNOSIS — E785 Hyperlipidemia, unspecified: Secondary | ICD-10-CM

## 2015-03-19 DIAGNOSIS — Z0000 Encounter for general adult medical examination without abnormal findings: Secondary | ICD-10-CM | POA: Diagnosis not present

## 2015-03-19 DIAGNOSIS — I6529 Occlusion and stenosis of unspecified carotid artery: Secondary | ICD-10-CM | POA: Diagnosis not present

## 2015-03-19 LAB — LIPID PANEL
CHOL/HDL RATIO: 2.5 ratio (ref <=5.0)
CHOLESTEROL: 145 mg/dL (ref 125–200)
HDL: 59 mg/dL (ref >=40)
LDL Cholesterol: 71 mg/dL (ref <130)
Triglycerides: 74 mg/dL (ref <150)
VLDL: 15 mg/dL (ref <30)

## 2015-03-19 LAB — CBC WITH DIFFERENTIAL/PLATELET
BASOS PCT: 1 % (ref 0–1)
Basophils Absolute: 0.1 10*3/uL (ref 0.0–0.1)
Eosinophils Absolute: 0.2 10*3/uL (ref 0.0–0.7)
Eosinophils Relative: 2 % (ref 0–5)
HEMATOCRIT: 40.1 % (ref 39.0–52.0)
HEMOGLOBIN: 13.3 g/dL (ref 13.0–17.0)
LYMPHS ABS: 2.1 10*3/uL (ref 0.7–4.0)
LYMPHS PCT: 24 % (ref 12–46)
MCH: 32 pg (ref 26.0–34.0)
MCHC: 33.2 g/dL (ref 30.0–36.0)
MCV: 96.6 fL (ref 78.0–100.0)
MONOS PCT: 8 % (ref 3–12)
MPV: 9.4 fL (ref 8.6–12.4)
Monocytes Absolute: 0.7 10*3/uL (ref 0.1–1.0)
NEUTROS ABS: 5.7 10*3/uL (ref 1.7–7.7)
NEUTROS PCT: 65 % (ref 43–77)
Platelets: 207 10*3/uL (ref 150–400)
RBC: 4.15 MIL/uL — ABNORMAL LOW (ref 4.22–5.81)
RDW: 13.7 % (ref 11.5–15.5)
WBC: 8.7 10*3/uL (ref 4.0–10.5)

## 2015-03-19 LAB — COMPREHENSIVE METABOLIC PANEL
ALT: 14 U/L (ref 9–46)
AST: 20 U/L (ref 10–35)
Albumin: 3.8 g/dL (ref 3.6–5.1)
Alkaline Phosphatase: 76 U/L (ref 40–115)
BUN: 15 mg/dL (ref 7–25)
CALCIUM: 9.1 mg/dL (ref 8.6–10.3)
CHLORIDE: 101 mmol/L (ref 98–110)
CO2: 28 mmol/L (ref 20–31)
Creat: 0.78 mg/dL (ref 0.70–1.18)
GLUCOSE: 81 mg/dL (ref 65–99)
POTASSIUM: 3.9 mmol/L (ref 3.5–5.3)
Sodium: 138 mmol/L (ref 135–146)
Total Bilirubin: 0.6 mg/dL (ref 0.2–1.2)
Total Protein: 6.5 g/dL (ref 6.1–8.1)

## 2015-03-19 LAB — POCT URINALYSIS DIPSTICK
BILIRUBIN UA: NEGATIVE
Glucose, UA: NEGATIVE
KETONES UA: NEGATIVE
Leukocytes, UA: NEGATIVE
Nitrite, UA: NEGATIVE
PH UA: 7
PROTEIN UA: NEGATIVE
RBC UA: NEGATIVE
Spec Grav, UA: 1.015
Urobilinogen, UA: 0.2

## 2015-03-19 NOTE — Progress Notes (Signed)
Pre visit review using our clinic review tool, if applicable. No additional management support is needed unless otherwise documented below in the visit note. 

## 2015-03-19 NOTE — Patient Instructions (Signed)

## 2015-03-19 NOTE — Progress Notes (Signed)
Subjective:   Rick [name] is a 82 y.o. male who presents for Medicare Annual/Subsequent preventive examination.  Review of Systems:   Review of Systems  Constitutional: Negative for activity change, appetite change and fatigue.  HENT: Negative for hearing loss, congestion, tinnitus and ear discharge.   Eyes: Negative for visual disturbance (see optho q1y -- vision corrected to 20/20 with glasses).  Respiratory: Negative for cough, chest tightness and shortness of breath.   Cardiovascular: Negative for chest pain, palpitations and leg swelling.  Gastrointestinal: Negative for abdominal pain, diarrhea, constipation and abdominal distention.  Genitourinary: Negative for urgency, frequency, decreased urine volume and difficulty urinating.  Musculoskeletal: Negative for back pain, arthralgias and gait problem.  Skin: Negative for color change, pallor and rash.  Neurological: Negative for dizziness, light-headedness, numbness and headaches.  Hematological: Negative for adenopathy. Does not bruise/bleed easily.  Psychiatric/Behavioral: Negative for suicidal ideas, confusion, sleep disturbance, self-injury, dysphoric mood, decreased concentration and agitation.  Pt is able to read and write and can do all ADLs No risk for falling No abuse/ violence in home           Objective:    Vitals: BP 118/60 mmHg  Pulse 57  Temp(Src) 97.7 F (36.5 C) (Oral)  Ht _0  (1.88 m)  Wt 133 lb 6.4 oz (60.51 kg)  BMI 17.12 kg/m2  SpO2 98% BP 118/60 mmHg  Pulse 57  Temp(Src) 97.7 F (36.5 C) (Oral)  Ht _1  (1.88 m)  Wt 133 lb 6.4 oz (60.51 kg)  BMI 17.12 kg/m2  SpO2 98% General appearance: alert, cooperative, appears stated age and no distress Head: Normocephalic, without obvious abnormality, atraumatic Eyes: negative findings: lids and lashes normal and pupils equal, round, reactive to light and accomodation Ears: normal TM's and external ear canals both ears Nose: Nares normal.  Septum midline. Mucosa normal. No drainage or sinus tenderness. Throat: lips, mucosa, and tongue normal; teeth and gums normal Neck: no adenopathy, no JVD, thyroid not enlarged, symmetric, no tenderness/mass/nodules and + L bruit Back: negative Lungs: clear to auscultation bilaterally Chest wall: no tenderness Heart: S1, S2 normal Abdomen: soft, non-tender; bowel sounds normal; no masses,  no organomegaly Male genitalia: deferred Rectal: normal tone, normal prostate, no masses or tenderness Extremities: extremities normal, atraumatic, no cyanosis or edema Pulses: 2+ and symmetric Skin: Skin color, texture, turgor normal. No rashes or lesions Lymph nodes: Cervical, supraclavicular, and axillary nodes normal. Neurologic: Alert and oriented X 3, normal strength and tone. Normal symmetric reflexes. Normal coordination and gait Psych- no depression, no anxiety  Tobacco History  Smoking status  . Light Tobacco Smoker -- 0.50 packs/day for 50 years  . Types: Cigarettes  Smokeless tobacco  . Never Used     Ready to quit: Not Answered Counseling given: Not Answered   Past Medical History  Diagnosis Date  . Hx of colonic polyps   . Hypertension   . Kidney stones   . Chronic ear infection     left  . Hyperlipidemia   . BPH (benign prostatic hyperplasia)   . Osteoporosis   . Carotid artery occlusion    Past Surgical History  Procedure Laterality Date  . Tonsillectomy     Family History  Problem Relation Age of Onset  . Stroke Mother   . Seizures Father    History  Sexual Activity  . Sexual Activity: Not on file    Outpatient Encounter Prescriptions as of 03/19/2015  Medication Sig  . atorvastatin (LIPITOR) 20 MG  tablet TAKE 1 TABLET BY MOUTH EVERY DAY  . Calcium-Vitamin D (CALTRATE 600 PLUS-VIT D PO) Take 600 mg by mouth 2 (two) times daily.   . clopidogrel (PLAVIX) 75 MG tablet TAKE 1 TABLET BY MOUTH EVERY DAY  . Cranberry-Cholecalciferol 4200-500 MG-UNIT CAPS Take 1  capsule by mouth daily.  . cyanocobalamin 100 MCG tablet Take 100 mcg by mouth daily.  Marland Kitchen denosumab (PROLIA) 60 MG/ML SOLN injection Inject 60 mg into the skin every 6 (six) months. Administer in upper arm, thigh, or abdomen  . MULTIPLE VITAMIN PO Take by mouth.    . Saw Palmetto 450 MG CAPS Take by mouth.    Marland Kitchen VITAMIN D, CHOLECALCIFEROL, PO Take by mouth.     No facility-administered encounter medications on file as of 03/19/2015.    Activities of Daily Living In your present state of health, do you have any difficulty performing the following activities: 03/19/2015  Hearing? Y  Vision? N  Difficulty concentrating or making decisions? N  Walking or climbing stairs? N  Dressing or bathing? N  Doing errands, shopping? N    Patient Care Team: Rosalita Chessman, DO as PCP - General Lowella Bandy, MD as Consulting Physician (Urology) Mal Misty, MD as Consulting Physician (Vascular Surgery) Jari Pigg, MD as Consulting Physician (Dermatology) Calvert Cantor, MD as Consulting Physician (Ophthalmology)   Assessment:    CPE: Exercise Activities and Dietary recommendations-- walking    Goals    None     Fall Risk Fall Risk  03/19/2015 03/06/2014 11/18/2012  Falls in the past year? No No Yes  Number falls in past yr: - - 1  Injury with Fall? - - Yes  Risk Factor Category  - - High Fall Risk  Risk for fall due to : - - Other (Comment)  Risk for fall due to (comments): - - Patient tripped coming down the stairs   Depression Screen PHQ 2/9 Scores 03/19/2015 03/06/2014 11/18/2012 11/13/2011  PHQ - 2 Score 0 0 0 0    Cognitive Testing mmse 30/30  Immunization History  Administered Date(s) Administered  . Influenza Whole 11/28/1998, 12/25/2006, 12/09/2008, 11/14/2010, 11/20/2011, 11/20/2012  . Influenza-Unspecified 01/11/2014, 10/29/2014  . Pneumococcal Conjugate-13 03/06/2014  . Pneumococcal Polysaccharide-23 05/22/2002  . Td 12/01/1996, 03/15/2005  . Zoster 09/13/2005, 03/15/2006,  05/29/2006   Screening Tests Health Maintenance  Topic Date Due  . TETANUS/TDAP  03/16/2015  . INFLUENZA VACCINE  09/28/2015  . ZOSTAVAX  Completed  . PNA vac Low Risk Adult  Completed      Plan:    During the course of the visit the patient was educated and counseled about the following appropriate screening and preventive services:   Vaccines to include Pneumoccal, Influenza, Hepatitis B, Td, Zostavax, HCV  Electrocardiogram  Cardiovascular Disease  Colorectal cancer screening  Diabetes screening  Prostate Cancer Screening  Glaucoma screening  Nutrition counseling   Smoking cessation counseling  Patient Instructions (the written plan) was given to the patient.   1. Hyperlipidemia con't lipitor, check labs - Comp Met (CMET) - CBC with Differential/Platelet - Lipid panel - POCT urinalysis dipstick  2. Carotid stenosis, unspecified laterality Carotid US-- per Dr Kellie Simmering - Comp Met (CMET) - CBC with Differential/Platelet - Lipid panel - POCT urinalysis dipstick  3. Medicare annual wellness visit, subsequent See above  Garnet Koyanagi, DO  03/19/2015

## 2015-05-31 ENCOUNTER — Telehealth: Payer: Self-pay | Admitting: *Deleted

## 2015-05-31 NOTE — Telephone Encounter (Signed)
Noted  

## 2015-05-31 NOTE — Telephone Encounter (Signed)
Pt called stating he does not need referral. He called podiatrist he had seen before and has appt.

## 2015-05-31 NOTE — Telephone Encounter (Signed)
Called pt and LMOM for pt to call office to confirm that he needs referral to podiatry.

## 2015-05-31 NOTE — Telephone Encounter (Signed)
Pt returned RN's call. He says that he would definitely like to have a referral to a podiatrist. He says that he was referred to one once before w/ [facility] on [address]. He would like to go back to the same on, if possible.   ALSO, pt says that he had a Tetanus shot today at [facility] on [facility].

## 2015-05-31 NOTE — Telephone Encounter (Signed)
Immunization record updated. Dr. Etter Sjogren, okay to place referral to podiatry for R foot callous? If yes, please advise dx. I do not see podiatrist on [facility], so unsure who pt saw in the past. I do see that we referred him to [facility] 08/2014, but pt then canceled appt.

## 2015-05-31 NOTE — Telephone Encounter (Signed)
[employer] note received via fax  Call:   Date: 05/29/15 Time: 1030   Caller: Self Return number: [phone]  Nurse: Roberto Scales  Reason for call: Caller states he has a callous on right foot, has had it shaved off in past by a podiatrist. He needs this done again. Please call him back asap.

## 2015-06-03 DIAGNOSIS — L84 Corns and callosities: Secondary | ICD-10-CM | POA: Diagnosis not present

## 2015-06-24 ENCOUNTER — Encounter: Payer: Self-pay | Admitting: Family

## 2015-06-29 ENCOUNTER — Encounter: Payer: Self-pay | Admitting: Family

## 2015-06-29 ENCOUNTER — Ambulatory Visit (INDEPENDENT_AMBULATORY_CARE_PROVIDER_SITE_OTHER): Payer: Medicare Other | Admitting: Family

## 2015-06-29 ENCOUNTER — Ambulatory Visit (HOSPITAL_COMMUNITY)
Admission: RE | Admit: 2015-06-29 | Discharge: 2015-06-29 | Disposition: A | Discharge status: Home / Self Care | Payer: Medicare Other | Source: Ambulatory Visit | Attending: Family | Admitting: Family

## 2015-06-29 ENCOUNTER — Other Ambulatory Visit: Payer: Self-pay | Admitting: Vascular Surgery

## 2015-06-29 VITALS — BP 121/52 | HR 55 | Ht 74.0 in | Wt 133.7 lb

## 2015-06-29 DIAGNOSIS — I6529 Occlusion and stenosis of unspecified carotid artery: Secondary | ICD-10-CM

## 2015-06-29 DIAGNOSIS — R001 Bradycardia, unspecified: Secondary | ICD-10-CM

## 2015-06-29 DIAGNOSIS — I6523 Occlusion and stenosis of bilateral carotid arteries: Secondary | ICD-10-CM | POA: Insufficient documentation

## 2015-06-29 DIAGNOSIS — F17200 Nicotine dependence, unspecified, uncomplicated: Secondary | ICD-10-CM | POA: Diagnosis not present

## 2015-06-29 NOTE — Patient Instructions (Addendum)
Stroke Prevention Some medical conditions and behaviors are associated with an increased chance of having a stroke. You may prevent a stroke by making healthy choices and managing medical conditions. HOW CAN I REDUCE MY RISK OF HAVING A STROKE?   Stay physically active. Get at least 30 minutes of activity on most or all days.  Do not smoke. It may also be helpful to avoid exposure to secondhand smoke.  Limit alcohol use. Moderate alcohol use is considered to be:  No more than 2 drinks per day for men.  No more than 1 drink per day for nonpregnant women.  Eat healthy foods. This involves:  Eating 5 or more servings of fruits and vegetables a day.  Making dietary changes that address high blood pressure (hypertension), high cholesterol, diabetes, or obesity.  Manage your cholesterol levels.  Making food choices that are high in fiber and low in saturated fat, trans fat, and cholesterol may control cholesterol levels.  Take any prescribed medicines to control cholesterol as directed by your health care provider.  Manage your diabetes.  Controlling your carbohydrate and sugar intake is recommended to manage diabetes.  Take any prescribed medicines to control diabetes as directed by your health care provider.  Control your hypertension.  Making food choices that are low in salt (sodium), saturated fat, trans fat, and cholesterol is recommended to manage hypertension.  Ask your health care provider if you need treatment to lower your blood pressure. Take any prescribed medicines to control hypertension as directed by your health care provider.  If you are 18-39 years of age, have your blood pressure checked every 3-5 years. If you are 40 years of age or older, have your blood pressure checked every year.  Maintain a healthy weight.  Reducing calorie intake and making food choices that are low in sodium, saturated fat, trans fat, and cholesterol are recommended to manage  weight.  Stop drug abuse.  Avoid taking birth control pills.  Talk to your health care provider about the risks of taking birth control pills if you are over 35 years old, smoke, get migraines, or have ever had a blood clot.  Get evaluated for sleep disorders (sleep apnea).  Talk to your health care provider about getting a sleep evaluation if you snore a lot or have excessive sleepiness.  Take medicines only as directed by your health care provider.  For some people, aspirin or blood thinners (anticoagulants) are helpful in reducing the risk of forming abnormal blood clots that can lead to stroke. If you have the irregular heart rhythm of atrial fibrillation, you should be on a blood thinner unless there is a good reason you cannot take them.  Understand all your medicine instructions.  Make sure that other conditions (such as anemia or atherosclerosis) are addressed. SEEK IMMEDIATE MEDICAL CARE IF:   You have sudden weakness or numbness of the face, arm, or leg, especially on one side of the body.  Your face or eyelid droops to one side.  You have sudden confusion.  You have trouble speaking (aphasia) or understanding.  You have sudden trouble seeing in one or both eyes.  You have sudden trouble walking.  You have dizziness.  You have a loss of balance or coordination.  You have a sudden, severe headache with no known cause.  You have new chest pain or an irregular heartbeat. Any of these symptoms may represent a serious problem that is an emergency. Do not wait to see if the symptoms will   go away. Get medical help at once. Call your local emergency services (911 in [place] Do not drive yourself to the hospital.   This information is not intended to replace advice given to you by your health care provider. Make sure you discuss any questions you have with your health care provider.   Document Released: 03/23/2004 Document Revised: 03/06/2014 Document Reviewed:  08/16/2012 Elsevier Interactive Patient Education 2016 Elsevier Inc.     Steps to Quit Smoking  Smoking tobacco can be harmful to your health and can affect almost every organ in your body. Smoking puts you, and those around you, at risk for developing many serious chronic diseases. Quitting smoking is difficult, but it is one of the best things that you can do for your health. It is never too late to quit. WHAT ARE THE BENEFITS OF QUITTING SMOKING? When you quit smoking, you lower your risk of developing serious diseases and conditions, such as:  Lung cancer or lung disease, such as COPD.  Heart disease.  Stroke.  Heart attack.  Infertility.  Osteoporosis and bone fractures. Additionally, symptoms such as coughing, wheezing, and shortness of breath may get better when you quit. You may also find that you get sick less often because your body is stronger at fighting off colds and infections. If you are pregnant, quitting smoking can help to reduce your chances of having a baby of low birth weight. HOW DO I GET READY TO QUIT? When you decide to quit smoking, create a plan to make sure that you are successful. Before you quit:  Pick a date to quit. Set a date within the next two weeks to give you time to prepare.  Write down the reasons why you are quitting. Keep this list in places where you will see it often, such as on your bathroom mirror or in your car or wallet.  Identify the people, places, things, and activities that make you want to smoke (triggers) and avoid them. Make sure to take these actions:  Throw away all cigarettes at home, at work, and in your car.  Throw away smoking accessories, such as ashtrays and lighters.  Clean your car and make sure to empty the ashtray.  Clean your home, including curtains and carpets.  Tell your family, friends, and coworkers that you are quitting. Support from your loved ones can make quitting easier.  Talk with your health care  provider about your options for quitting smoking.  Find out what treatment options are covered by your health insurance. WHAT STRATEGIES CAN I USE TO QUIT SMOKING?  Talk with your healthcare provider about different strategies to quit smoking. Some strategies include:  Quitting smoking altogether instead of gradually lessening how much you smoke over a period of time. Research shows that quitting "cold turkey" is more successful than gradually quitting.  Attending in-person counseling to help you build problem-solving skills. You are more likely to have success in quitting if you attend several counseling sessions. Even short sessions of 10 minutes can be effective.  Finding resources and support systems that can help you to quit smoking and remain smoke-free after you quit. These resources are most helpful when you use them often. They can include:  Online chats with a counselor.  Telephone quitlines.  Printed self-help materials.  Support groups or group counseling.  Text messaging programs.  Mobile phone applications.  Taking medicines to help you quit smoking. (If you are pregnant or breastfeeding, talk with your health care provider first.) Some   medicines contain nicotine and some do not. Both types of medicines help with cravings, but the medicines that include nicotine help to relieve withdrawal symptoms. Your health care provider may recommend:  Nicotine patches, gum, or lozenges.  Nicotine inhalers or sprays.  Non-nicotine medicine that is taken by mouth. Talk with your health care provider about combining strategies, such as taking medicines while you are also receiving in-person counseling. Using these two strategies together makes you more likely to succeed in quitting than if you used either strategy on its own. If you are pregnant or breastfeeding, talk with your health care provider about finding counseling or other support strategies to quit smoking. Do not take  medicine to help you quit smoking unless told to do so by your health care provider. WHAT THINGS CAN I DO TO MAKE IT EASIER TO QUIT? Quitting smoking might feel overwhelming at first, but there is a lot that you can do to make it easier. Take these important actions:  Reach out to your family and friends and ask that they support and encourage you during this time. Call telephone quitlines, reach out to support groups, or work with a counselor for support.  Ask people who smoke to avoid smoking around you.  Avoid places that trigger you to smoke, such as bars, parties, or smoke-break areas at work.  Spend time around people who do not smoke.  Lessen stress in your life, because stress can be a smoking trigger for some people. To lessen stress, try:  Exercising regularly.  Deep-breathing exercises.  Yoga.  Meditating.  Performing a body scan. This involves closing your eyes, scanning your body from head to toe, and noticing which parts of your body are particularly tense. Purposefully relax the muscles in those areas.  Download or purchase mobile phone or tablet apps (applications) that can help you stick to your quit plan by providing reminders, tips, and encouragement. There are many free apps, such as QuitGuide from the CDC (Centers for Disease Control and Prevention). You can find other support for quitting smoking (smoking cessation) through smokefree.gov and other websites. HOW WILL I FEEL WHEN I QUIT SMOKING? Within the first 24 hours of quitting smoking, you may start to feel some withdrawal symptoms. These symptoms are usually most noticeable 2-3 days after quitting, but they usually do not last beyond 2-3 weeks. Changes or symptoms that you might experience include:  Mood swings.  Restlessness, anxiety, or irritation.  Difficulty concentrating.  Dizziness.  Strong cravings for sugary foods in addition to nicotine.  Mild weight  gain.  Constipation.  Nausea.  Coughing or a sore throat.  Changes in how your medicines work in your body.  A depressed mood.  Difficulty sleeping (insomnia). After the first 2-3 weeks of quitting, you may start to notice more positive results, such as:  Improved sense of smell and taste.  Decreased coughing and sore throat.  Slower heart rate.  Lower blood pressure.  Clearer skin.  The ability to breathe more easily.  Fewer sick days. Quitting smoking is very challenging for most people. Do not get discouraged if you are not successful the first time. Some people need to make many attempts to quit before they achieve long-term success. Do your best to stick to your quit plan, and talk with your health care provider if you have any questions or concerns.   This information is not intended to replace advice given to you by your health care provider. Make sure you discuss any questions   you have with your health care provider.   Document Released: 02/07/2001 Document Revised: 06/30/2014 Document Reviewed: 06/30/2014 Elsevier Interactive Patient Education 2016 Elsevier Inc.  

## 2015-06-29 NOTE — Progress Notes (Signed)
Chief Complaint: Follow up Extracranial Carotid Artery Stenosis   History of Present Illness  Rick [name] is a 82 y.o. male patient of Dr. Kellie Simmering is seen today for follow-up regarding his extracranial carotid artery occlusive disease. Dr. Kellie Simmering has followed a mild to moderate left ICA stenosis for the past 14 years. He remains asymptomatic. He denies lateralizing weakness, aphasia, amaurosis fugax, diplopia, blurred vision, or syncope. He is unable to take aspirin because of an allergy but does take Plavix on a daily basis.  He walks 6 days/week, 45 minutes fast paced; he works out with weights every other day.   Pt denies any dizziness, denies feeling light-headed, denies syncopal episodes. His heart rate is 40-42 bpm.  Pt Diabetic: no Pt smoker: smoker  (7 cigarettes/day, started at age 54 yrs)  Pt meds include: Statin : yes ASA: no, allegic Other anticoagulants/antiplatelets: Plavix   Past Medical History  Diagnosis Date  . Hx of colonic polyps   . Hypertension   . Kidney stones   . Chronic ear infection     left  . Hyperlipidemia   . BPH (benign prostatic hyperplasia)   . Osteoporosis   . Carotid artery occlusion     Social History Social History  Substance Use Topics  . Smoking status: Light Tobacco Smoker -- 0.50 packs/day for 50 years    Types: Cigarettes  . Smokeless tobacco: Never Used  . Alcohol Use: No    Family History Family History  Problem Relation Age of Onset  . Stroke Mother   . Seizures Father     Surgical History Past Surgical History  Procedure Laterality Date  . Tonsillectomy      Allergies  Allergen Reactions  . Aspirin Hives and Itching  . Penicillins Hives and Itching    Current Outpatient Prescriptions  Medication Sig Dispense Refill  . atorvastatin (LIPITOR) 20 MG tablet TAKE 1 TABLET BY MOUTH EVERY DAY 90 tablet 1  . Calcium-Vitamin D (CALTRATE 600 PLUS-VIT D PO) Take 600 mg by mouth 2 (two) times daily.     .  clopidogrel (PLAVIX) 75 MG tablet TAKE 1 TABLET BY MOUTH EVERY DAY 90 tablet 3  . Cranberry-Cholecalciferol 4200-500 MG-UNIT CAPS Take 1 capsule by mouth daily.    . cyanocobalamin 100 MCG tablet Take 100 mcg by mouth daily.    Marland Kitchen denosumab (PROLIA) 60 MG/ML SOLN injection Inject 60 mg into the skin every 6 (six) months. Administer in upper arm, thigh, or abdomen    . MULTIPLE VITAMIN PO Take by mouth.      . Saw Palmetto 450 MG CAPS Take by mouth.      Marland Kitchen VITAMIN D, CHOLECALCIFEROL, PO Take by mouth.       No current facility-administered medications for this visit.    Review of Systems : See HPI for pertinent positives and negatives.  Physical Examination  Filed Vitals:   06/29/15 1309 06/29/15 1321  BP: 120/50 121/52  Pulse: 55   Height: 6\' 2"  (1.88 m)   Weight: 133 lb 11.2 oz (60.646 kg)   SpO2: 98%    Body mass index is 17.16 kg/(m^2).  General: WDWN thin male in NAD GAIT: normal Eyes: PERRLA Pulmonary:  Non-labored respirations, CTAB, no rales, rhonchi, or wheezing.  Cardiac: regular rate, bradycardic at 42 bpm, no detected murmur.  VASCULAR EXAM Carotid Bruits Right Left   Negative Negative    Aorta is not palpable. Radial pulses are 2+ palpable and equal.  LE Pulses Right Left       POPLITEAL  not palpable   1+ palpable       POSTERIOR TIBIAL  2+ palpable   2+ palpable        DORSALIS PEDIS      ANTERIOR TIBIAL 2+ palpable  2+ palpable     Gastrointestinal: soft, nontender, BS WNL, no r/g, no palpable masses.  Musculoskeletal: Age related muscle atrophy/wasting. M/S 5/5 throughout, extremities without ischemic changes. Moderate kyphosis.  Neurologic: A&O X 3; Appropriate Affect, Speech is normal CN 2-12 intact, pain and light touch intact in extremities, motor exam as listed above.   Non-Invasive Vascular Imaging CAROTID DUPLEX  06/29/2015   CEREBROVASCULAR DUPLEX EVALUATION    INDICATION: Carotid artery disease     PREVIOUS INTERVENTION(S):     DUPLEX EXAM:     RIGHT  LEFT  Peak Systolic Velocities (cm/s) End Diastolic Velocities (cm/s) Plaque LOCATION Peak Systolic Velocities (cm/s) End Diastolic Velocities (cm/s) Plaque  70 9  CCA PROXIMAL 96 9 HT  72 13  CCA MID 80 12   87 11 TH CCA DISTAL 64 12 HT  197 18 HT ECA 118 0 HT  93 12 HT ICA PROXIMAL 249 45 CP  94 14  ICA MID 81 17   99 16  ICA DISTAL 69 19     1.3 ICA / CCA Ratio (PSV) 3.1  Antegrade  Vertebral Flow Antegrade    Brachial Systolic Pressure (mmHg)   Multiphasic (Subclavian artery) Brachial Artery Waveforms Multiphasic (Subclavian artery)    Plaque Morphology:  HM = Homogeneous, HT = Heterogeneous, CP = Calcific Plaque, SP = Smooth Plaque, IP = Irregular Plaque     ADDITIONAL FINDINGS: Right external carotid artery stenosis.    IMPRESSION: Right internal carotid artery velocities suggest a <40% stenosis.  Left internal carotid artery velocities suggest a 40-59% stenosis.     Compared to the previous exam:  No significant change in comparison to the last exam on 05/27/2013.      Assessment: Rick [name] is a 82 y.o. male who has no history of stroke or TIA. Today's carotid duplex suggests  <40% right ICA stenosis and 40-59% left ICA stenosis. No significant change in comparison to the last exam on 05/27/2013.  He is bradycardic at 40-55 bpm, but asymptomatic. I advised pt to notify his PCP if he becomes light headed or dizzy.  He does not have DM but does continue to smoke 7 cigarettes/day, states he is decreasing his use and trying to quit. The patient was counseled re smoking cessation and given several free resources re smoking cessation.   Plan: Follow-up in 1 year with Carotid Duplex scan.   I discussed in depth with the patient the nature of atherosclerosis, and emphasized the importance of maximal medical management  including strict control of blood pressure, blood glucose, and lipid levels, obtaining regular exercise, and continued cessation of smoking.  The patient is aware that without maximal medical management the underlying atherosclerotic disease process will progress, limiting the benefit of any interventions. The patient was given information about stroke prevention and what symptoms should prompt the patient to seek immediate medical care. Thank you for allowing [place] to participate in this patient's care.  Clemon Chambers, RN, MSN, FNP-C [facility] Office: [phone]  Clinic Physician: Kellie Simmering  06/29/2015 1:31 PM

## 2015-08-03 DIAGNOSIS — R972 Elevated prostate specific antigen [PSA]: Secondary | ICD-10-CM | POA: Diagnosis not present

## 2015-08-04 NOTE — Addendum Note (Signed)
Addended by: Thresa Ross C on: 08/04/2015 11:27 AM   Modules accepted: Orders

## 2015-08-06 DIAGNOSIS — R972 Elevated prostate specific antigen [PSA]: Secondary | ICD-10-CM | POA: Diagnosis not present

## 2015-08-06 DIAGNOSIS — R3911 Hesitancy of micturition: Secondary | ICD-10-CM | POA: Diagnosis not present

## 2015-08-06 DIAGNOSIS — N401 Enlarged prostate with lower urinary tract symptoms: Secondary | ICD-10-CM | POA: Diagnosis not present

## 2015-08-11 ENCOUNTER — Telehealth: Payer: Self-pay | Admitting: Family Medicine

## 2015-08-11 NOTE — Telephone Encounter (Signed)
° °  Reason for call:  LVM informing to schedule BMP and prolia due on the 08/19/14.

## 2015-08-17 ENCOUNTER — Other Ambulatory Visit (INDEPENDENT_AMBULATORY_CARE_PROVIDER_SITE_OTHER): Payer: Medicare Other

## 2015-08-17 DIAGNOSIS — E785 Hyperlipidemia, unspecified: Secondary | ICD-10-CM | POA: Diagnosis not present

## 2015-08-17 LAB — BASIC METABOLIC PANEL
BUN: 13 mg/dL (ref 6–23)
CHLORIDE: 104 meq/L (ref 96–112)
CO2: 29 meq/L (ref 19–32)
Calcium: 9.8 mg/dL (ref 8.4–10.5)
Creatinine, Ser: 0.81 mg/dL (ref 0.40–1.50)
GFR: 97.37 mL/min (ref >60.00)
Glucose, Bld: 113 mg/dL — ABNORMAL HIGH (ref 70–99)
POTASSIUM: 3.8 meq/L (ref 3.5–5.1)
SODIUM: 139 meq/L (ref 135–145)

## 2015-08-17 NOTE — Telephone Encounter (Signed)
Benefits subject to a $183 deductible ($183 met) and a 20% co insurance for the administration and cost of Prolia. No Prior authorization is required.   Secondary plan: the provider is in network for this patient's plan. The secondary plan does not follow Medicare guidelines. The secondary plan will consider the Medicare Part B deductible and co-insurance up to secondary plans allowable. Claims will crossover for coordination. No PA required. Patient is scheduled for a BMP and his Prolia has been scheduled.    KP

## 2015-08-19 DIAGNOSIS — D485 Neoplasm of uncertain behavior of skin: Secondary | ICD-10-CM | POA: Diagnosis not present

## 2015-08-19 DIAGNOSIS — Z85828 Personal history of other malignant neoplasm of skin: Secondary | ICD-10-CM | POA: Diagnosis not present

## 2015-08-19 DIAGNOSIS — L905 Scar conditions and fibrosis of skin: Secondary | ICD-10-CM | POA: Diagnosis not present

## 2015-08-19 DIAGNOSIS — D225 Melanocytic nevi of trunk: Secondary | ICD-10-CM | POA: Diagnosis not present

## 2015-08-19 DIAGNOSIS — L570 Actinic keratosis: Secondary | ICD-10-CM | POA: Diagnosis not present

## 2015-08-19 DIAGNOSIS — L821 Other seborrheic keratosis: Secondary | ICD-10-CM | POA: Diagnosis not present

## 2015-08-24 ENCOUNTER — Other Ambulatory Visit: Payer: Medicare Other

## 2015-08-24 ENCOUNTER — Ambulatory Visit (INDEPENDENT_AMBULATORY_CARE_PROVIDER_SITE_OTHER): Payer: Medicare Other | Admitting: Behavioral Health

## 2015-08-24 DIAGNOSIS — M810 Age-related osteoporosis without current pathological fracture: Secondary | ICD-10-CM

## 2015-08-24 MED ORDER — DENOSUMAB 60 MG/ML ~~LOC~~ SOLN
60.0000 mg | Freq: Once | SUBCUTANEOUS | Status: AC
  Administered 2015-08-24: 60 mg via SUBCUTANEOUS

## 2015-08-24 NOTE — Progress Notes (Signed)
Pre visit review using our clinic review tool, if applicable. No additional management support is needed unless otherwise documented below in the visit note.  Patient in clinic today for Prolia injection. SQ given in Right Arm. Patient tolerated injection well. No signs or symptoms of a reaction prior to patient leaving the nurse visit.

## 2015-09-16 ENCOUNTER — Ambulatory Visit: Payer: Medicare Other | Admitting: Family Medicine

## 2015-09-20 ENCOUNTER — Other Ambulatory Visit: Payer: Self-pay | Admitting: Family Medicine

## 2015-09-27 ENCOUNTER — Encounter: Payer: Self-pay | Admitting: Family Medicine

## 2015-09-27 ENCOUNTER — Ambulatory Visit (INDEPENDENT_AMBULATORY_CARE_PROVIDER_SITE_OTHER): Payer: Medicare Other | Admitting: Family Medicine

## 2015-09-27 VITALS — BP 122/60 | HR 66 | Temp 98.0°F | Ht 74.0 in | Wt 132.0 lb

## 2015-09-27 DIAGNOSIS — M810 Age-related osteoporosis without current pathological fracture: Secondary | ICD-10-CM | POA: Diagnosis not present

## 2015-09-27 DIAGNOSIS — E785 Hyperlipidemia, unspecified: Secondary | ICD-10-CM

## 2015-09-27 DIAGNOSIS — I6529 Occlusion and stenosis of unspecified carotid artery: Secondary | ICD-10-CM

## 2015-09-27 NOTE — Progress Notes (Signed)
Pre visit review using our clinic review tool, if applicable. No additional management support is needed unless otherwise documented below in the visit note. 

## 2015-09-27 NOTE — Assessment & Plan Note (Signed)
Check bmd con't prolia

## 2015-09-27 NOTE — Patient Instructions (Signed)

## 2015-09-27 NOTE — Progress Notes (Signed)
Patient ID: Rick [name], male    DOB: Mar 14, 1933  Age: 82 y.o. MRN: [id number]    Subjective:  Subjective  HPI Rick [name] presents for f/u lipid, no complaints   Review of Systems  Constitutional: Negative for appetite change, diaphoresis, fatigue and unexpected weight change.  Eyes: Negative for pain, redness and visual disturbance.  Respiratory: Negative for cough, chest tightness, shortness of breath and wheezing.   Cardiovascular: Negative for chest pain, palpitations and leg swelling.  Endocrine: Negative for cold intolerance, heat intolerance, polydipsia, polyphagia and polyuria.  Genitourinary: Negative for difficulty urinating, dysuria and frequency.  Neurological: Negative for dizziness, light-headedness, numbness and headaches.    History Past Medical History:  Diagnosis Date  . BPH (benign prostatic hyperplasia)   . Carotid artery occlusion   . Chronic ear infection    left  . Hx of colonic polyps   . Hyperlipidemia   . Hypertension   . Kidney stones   . Osteoporosis     He has a past surgical history that includes Tonsillectomy.   His family history includes Seizures in his father; Stroke in his mother.He reports that he has been smoking Cigarettes.  He has a 25.00 pack-year smoking history. He has never used smokeless tobacco. He reports that he does not drink alcohol or use drugs.  Current Outpatient Prescriptions on File Prior to Visit  Medication Sig Dispense Refill  . atorvastatin (LIPITOR) 20 MG tablet TAKE 1 TABLET BY MOUTH EVERY DAY 90 tablet 1  . Calcium-Vitamin D (CALTRATE 600 PLUS-VIT D PO) Take 600 mg by mouth 2 (two) times daily.     . clopidogrel (PLAVIX) 75 MG tablet TAKE 1 TABLET BY MOUTH EVERY DAY 90 tablet 3  . Cranberry-Cholecalciferol 4200-500 MG-UNIT CAPS Take 1 capsule by mouth daily.    . cyanocobalamin 100 MCG tablet Take 100 mcg by mouth daily.    Marland Kitchen denosumab (PROLIA) 60 MG/ML SOLN injection Inject 60 mg into the skin every 6  (six) months. Administer in upper arm, thigh, or abdomen    . MULTIPLE VITAMIN PO Take by mouth.      . Saw Palmetto 450 MG CAPS Take by mouth.      Marland Kitchen VITAMIN D, CHOLECALCIFEROL, PO Take by mouth.       No current facility-administered medications on file prior to visit.      Objective:  Objective  Physical Exam  Constitutional: He is oriented to person, place, and time. Vital signs are normal. He appears well-developed and well-nourished. He is sleeping.  HENT:  Head: Normocephalic and atraumatic.  Mouth/Throat: Oropharynx is clear and moist.  Eyes: EOM are normal. Pupils are equal, round, and reactive to light.  Neck: Normal range of motion. Neck supple. No thyromegaly present.  Cardiovascular: Normal rate and regular rhythm.   No murmur heard. Pulmonary/Chest: Effort normal and breath sounds normal. No respiratory distress. He has no wheezes. He has no rales. He exhibits no tenderness.  Musculoskeletal: He exhibits no edema or tenderness.  Neurological: He is alert and oriented to person, place, and time.  Skin: Skin is warm and dry.  Psychiatric: He has a normal mood and affect. His behavior is normal. Judgment and thought content normal.  Nursing note and vitals reviewed.  BP 122/60 (BP Location: Right Arm, Patient Position: Sitting, Cuff Size: Normal)   Pulse 66   Temp 98 F (36.7 C) (Oral)   Ht 6\' 2"  (1.88 m)   Wt 132 lb (59.9 kg)   SpO2  97%   BMI 16.95 kg/m  Wt Readings from Last 3 Encounters:  09/27/15 132 lb (59.9 kg)  06/29/15 133 lb 11.2 oz (60.6 kg)  03/19/15 133 lb 6.4 oz (60.5 kg)     Lab Results  Component Value Date   WBC 8.7 03/19/2015   HGB 13.3 03/19/2015   HCT 40.1 03/19/2015   PLT 207 03/19/2015   GLUCOSE 113 (H) 08/17/2015   CHOL 145 03/19/2015   TRIG 74 03/19/2015   HDL 59 03/19/2015   LDLCALC 71 03/19/2015   ALT 14 03/19/2015   AST 20 03/19/2015   NA 139 08/17/2015   K 3.8 08/17/2015   CL 104 08/17/2015   CREATININE 0.81 08/17/2015    BUN 13 08/17/2015   CO2 29 08/17/2015   TSH 0.71 04/14/2010   PSA 5.00 (H) 03/06/2014    No results found.   Assessment & Plan:  Plan  I am having [name] maintain his Calcium-Vitamin D (CALTRATE 600 PLUS-VIT D PO), MULTIPLE VITAMIN PO, Saw Palmetto, (VITAMIN D, CHOLECALCIFEROL, PO), cyanocobalamin, denosumab, Cranberry-Cholecalciferol, clopidogrel, and atorvastatin.  No orders of the defined types were placed in this encounter.   Problem List Items Addressed This Visit      Unprioritized   Hyperlipidemia   Relevant Orders   Comprehensive metabolic panel   Lipid panel   Vitamin D (25 hydroxy)    Other Visit Diagnoses    Osteoporosis    -  Primary   Relevant Orders   DG Bone Density   Comprehensive metabolic panel   Lipid panel   Vitamin D (25 hydroxy)      Follow-up: Return in about 6 months (around 03/29/2016) for annual exam, fasting.  Ann Held, DO

## 2015-09-27 NOTE — Assessment & Plan Note (Signed)
con't med Check labs 

## 2015-09-28 LAB — LIPID PANEL
CHOLESTEROL: 143 mg/dL (ref 0–200)
HDL: 52.9 mg/dL (ref >39.00)
LDL Cholesterol: 74 mg/dL (ref 0–99)
NonHDL: 89.61
TRIGLYCERIDES: 77 mg/dL (ref 0.0–149.0)
Total CHOL/HDL Ratio: 3
VLDL: 15.4 mg/dL (ref 0.0–40.0)

## 2015-09-28 LAB — COMPREHENSIVE METABOLIC PANEL
ALBUMIN: 4 g/dL (ref 3.5–5.2)
ALT: 12 U/L (ref 0–53)
AST: 17 U/L (ref 0–37)
Alkaline Phosphatase: 75 U/L (ref 39–117)
BILIRUBIN TOTAL: 0.3 mg/dL (ref 0.2–1.2)
BUN: 13 mg/dL (ref 6–23)
CALCIUM: 10.1 mg/dL (ref 8.4–10.5)
CO2: 30 mEq/L (ref 19–32)
CREATININE: 0.89 mg/dL (ref 0.40–1.50)
Chloride: 104 mEq/L (ref 96–112)
GFR: 87.32 mL/min (ref >60.00)
Glucose, Bld: 84 mg/dL (ref 70–99)
Potassium: 4.5 mEq/L (ref 3.5–5.1)
Sodium: 140 mEq/L (ref 135–145)
Total Protein: 7 g/dL (ref 6.0–8.3)

## 2015-09-28 LAB — VITAMIN D 25 HYDROXY (VIT D DEFICIENCY, FRACTURES): VITD: 47.22 ng/mL (ref 30.00–100.00)

## 2015-09-29 ENCOUNTER — Telehealth: Payer: Self-pay | Admitting: Family Medicine

## 2015-09-29 NOTE — Telephone Encounter (Signed)
I talked to Rick [name] to schedule bone density scan and he asked if it is really necessary. He wanted me to check with Dr. Etter Sjogren. Please advise. Last I saw was in 2014. Pt states he thinks he had one more recently.

## 2015-09-29 NOTE — Telephone Encounter (Signed)
Yes he needs one because he has osteoporosis and we need to make sure the prolia has prevented further bone loss. Please schedule.    KP

## 2015-09-30 NOTE — Telephone Encounter (Signed)
Pt informed and he would like done at [facility]. Transferred to imaging to schedule appt.

## 2015-10-05 ENCOUNTER — Ambulatory Visit (HOSPITAL_BASED_OUTPATIENT_CLINIC_OR_DEPARTMENT_OTHER)
Admission: RE | Admit: 2015-10-05 | Discharge: 2015-10-05 | Disposition: A | Discharge status: Home / Self Care | Payer: Medicare Other | Source: Ambulatory Visit | Attending: Family Medicine | Admitting: Family Medicine

## 2015-10-05 DIAGNOSIS — M8588 Other specified disorders of bone density and structure, other site: Secondary | ICD-10-CM | POA: Insufficient documentation

## 2015-10-05 DIAGNOSIS — M8589 Other specified disorders of bone density and structure, multiple sites: Secondary | ICD-10-CM | POA: Diagnosis not present

## 2015-10-05 DIAGNOSIS — M85852 Other specified disorders of bone density and structure, left thigh: Secondary | ICD-10-CM | POA: Insufficient documentation

## 2015-10-05 DIAGNOSIS — M810 Age-related osteoporosis without current pathological fracture: Secondary | ICD-10-CM

## 2015-11-15 DIAGNOSIS — Z23 Encounter for immunization: Secondary | ICD-10-CM | POA: Diagnosis not present

## 2015-12-14 DIAGNOSIS — H524 Presbyopia: Secondary | ICD-10-CM | POA: Diagnosis not present

## 2015-12-14 DIAGNOSIS — H25813 Combined forms of age-related cataract, bilateral: Secondary | ICD-10-CM | POA: Diagnosis not present

## 2015-12-16 ENCOUNTER — Other Ambulatory Visit: Payer: Self-pay | Admitting: Family Medicine

## 2016-02-25 ENCOUNTER — Telehealth: Payer: Self-pay

## 2016-02-25 ENCOUNTER — Ambulatory Visit: Payer: Medicare Other

## 2016-02-25 NOTE — Telephone Encounter (Signed)
-----   Message from Laren Everts sent at 02/25/2016 10:34 AM EST ----- Hey! I got his benefits back but he required a prior auth, so we submitted that but I have not received approval back from his insurance so I am not sure if its been approved or not.   You should at least make him aware of that, we can always look into it and get him scheduled for next week.  ----- Message ----- From: Bunnie Domino, LPN Sent: [id number]   9:39 AM To: Everlene Balls, MD, Laren Everts  Patient on schedule today for Prolia inj. Don't have one in the fridge for him. He has had one here before 6 months ago. Do I need to reschedule?

## 2016-02-25 NOTE — Telephone Encounter (Signed)
Today's appointment for Prolia injection cancelled. Medication not on hand. Notified Ellison Carwin who states we are waiting to hear from insurance. Patient notified via answering machine per his request.

## 2016-02-29 DIAGNOSIS — H7202 Central perforation of tympanic membrane, left ear: Secondary | ICD-10-CM | POA: Diagnosis not present

## 2016-03-01 DIAGNOSIS — L821 Other seborrheic keratosis: Secondary | ICD-10-CM | POA: Diagnosis not present

## 2016-03-01 DIAGNOSIS — L570 Actinic keratosis: Secondary | ICD-10-CM | POA: Diagnosis not present

## 2016-03-01 DIAGNOSIS — Z85828 Personal history of other malignant neoplasm of skin: Secondary | ICD-10-CM | POA: Diagnosis not present

## 2016-03-01 DIAGNOSIS — D225 Melanocytic nevi of trunk: Secondary | ICD-10-CM | POA: Diagnosis not present

## 2016-03-01 DIAGNOSIS — Z23 Encounter for immunization: Secondary | ICD-10-CM | POA: Diagnosis not present

## 2016-03-02 ENCOUNTER — Telehealth: Payer: Self-pay | Admitting: Family Medicine

## 2016-03-02 NOTE — Telephone Encounter (Signed)
Could you authorize this patients prolia

## 2016-03-02 NOTE — Telephone Encounter (Signed)
Patients last prolia was 08/24/2015 Can forward to Martinique Johnson who is doing our prolia PA's .  Advise.

## 2016-03-02 NOTE — Telephone Encounter (Signed)
Needs cmp before injection can be done

## 2016-03-02 NOTE — Telephone Encounter (Signed)
Relation to [id number] Call back number:[phone]   Reason for call:  Patient would like to schedule prolia injection, please advise

## 2016-03-17 ENCOUNTER — Other Ambulatory Visit: Payer: Self-pay | Admitting: Family Medicine

## 2016-03-21 ENCOUNTER — Encounter: Payer: Self-pay | Admitting: Family Medicine

## 2016-03-21 ENCOUNTER — Ambulatory Visit (INDEPENDENT_AMBULATORY_CARE_PROVIDER_SITE_OTHER): Payer: Medicare Other | Admitting: Family Medicine

## 2016-03-21 VITALS — BP 110/58 | HR 70 | Temp 98.1°F

## 2016-03-21 DIAGNOSIS — R8290 Unspecified abnormal findings in urine: Secondary | ICD-10-CM

## 2016-03-21 DIAGNOSIS — I6522 Occlusion and stenosis of left carotid artery: Secondary | ICD-10-CM

## 2016-03-21 DIAGNOSIS — E785 Hyperlipidemia, unspecified: Secondary | ICD-10-CM | POA: Diagnosis not present

## 2016-03-21 DIAGNOSIS — Z0000 Encounter for general adult medical examination without abnormal findings: Secondary | ICD-10-CM

## 2016-03-21 DIAGNOSIS — R8299 Other abnormal findings in urine: Secondary | ICD-10-CM | POA: Diagnosis not present

## 2016-03-21 DIAGNOSIS — N400 Enlarged prostate without lower urinary tract symptoms: Secondary | ICD-10-CM

## 2016-03-21 DIAGNOSIS — R82998 Other abnormal findings in urine: Secondary | ICD-10-CM

## 2016-03-21 DIAGNOSIS — M810 Age-related osteoporosis without current pathological fracture: Secondary | ICD-10-CM

## 2016-03-21 LAB — CBC WITH DIFFERENTIAL/PLATELET
BASOS PCT: 0.5 % (ref 0.0–3.0)
Basophils Absolute: 0 10*3/uL (ref 0.0–0.1)
EOS PCT: 2.9 % (ref 0.0–5.0)
Eosinophils Absolute: 0.2 10*3/uL (ref 0.0–0.7)
HEMATOCRIT: 39.1 % (ref 39.0–52.0)
HEMOGLOBIN: 13.3 g/dL (ref 13.0–17.0)
LYMPHS PCT: 26.7 % (ref 12.0–46.0)
Lymphs Abs: 2.1 10*3/uL (ref 0.7–4.0)
MCHC: 33.9 g/dL (ref 30.0–36.0)
MCV: 95.4 fl (ref 78.0–100.0)
MONO ABS: 0.7 10*3/uL (ref 0.1–1.0)
MONOS PCT: 8.4 % (ref 3.0–12.0)
Neutro Abs: 4.8 10*3/uL (ref 1.4–7.7)
Neutrophils Relative %: 61.5 % (ref 43.0–77.0)
Platelets: 260 10*3/uL (ref 150.0–400.0)
RBC: 4.09 Mil/uL — AB (ref 4.22–5.81)
RDW: 14.6 % (ref 11.5–15.5)
WBC: 7.8 10*3/uL (ref 4.0–10.5)

## 2016-03-21 LAB — COMPREHENSIVE METABOLIC PANEL
ALBUMIN: 4 g/dL (ref 3.5–5.2)
ALK PHOS: 80 U/L (ref 39–117)
ALT: 15 U/L (ref 0–53)
AST: 19 U/L (ref 0–37)
BUN: 14 mg/dL (ref 6–23)
CALCIUM: 9.8 mg/dL (ref 8.4–10.5)
CHLORIDE: 103 meq/L (ref 96–112)
CO2: 33 mEq/L — ABNORMAL HIGH (ref 19–32)
Creatinine, Ser: 0.99 mg/dL (ref 0.40–1.50)
GFR: 77.13 mL/min (ref >60.00)
Glucose, Bld: 105 mg/dL — ABNORMAL HIGH (ref 70–99)
POTASSIUM: 4.1 meq/L (ref 3.5–5.1)
SODIUM: 140 meq/L (ref 135–145)
TOTAL PROTEIN: 6.9 g/dL (ref 6.0–8.3)
Total Bilirubin: 0.3 mg/dL (ref 0.2–1.2)

## 2016-03-21 LAB — POC URINALSYSI DIPSTICK (AUTOMATED)
Bilirubin, UA: NEGATIVE
Blood, UA: NEGATIVE
Glucose, UA: NEGATIVE
KETONES UA: NEGATIVE
Nitrite, UA: NEGATIVE
UROBILINOGEN UA: 0.2
pH, UA: 6

## 2016-03-21 LAB — LIPID PANEL
CHOLESTEROL: 147 mg/dL (ref 0–200)
HDL: 56.6 mg/dL (ref >39.00)
LDL CALC: 73 mg/dL (ref 0–99)
NonHDL: 90.6
Total CHOL/HDL Ratio: 3
Triglycerides: 90 mg/dL (ref 0.0–149.0)
VLDL: 18 mg/dL (ref 0.0–40.0)

## 2016-03-21 NOTE — Patient Instructions (Signed)
Preventive Care 65 Years and Older, Male Preventive care refers to lifestyle choices and visits with your health care provider that can promote health and wellness. What does preventive care include?  A yearly physical exam. This is also called an annual well check.  Dental exams once or twice a year.  Routine eye exams. Ask your health care provider how often you should have your eyes checked.  Personal lifestyle choices, including:  Daily care of your teeth and gums.  Regular physical activity.  Eating a healthy diet.  Avoiding tobacco and drug use.  Limiting alcohol use.  Practicing safe sex.  Taking low doses of aspirin every day.  Taking vitamin and mineral supplements as recommended by your health care provider. What happens during an annual well check? The services and screenings done by your health care provider during your annual well check will depend on your age, overall health, lifestyle risk factors, and family history of disease. Counseling  Your health care provider may ask you questions about your:  Alcohol use.  Tobacco use.  Drug use.  Emotional well-being.  Home and relationship well-being.  Sexual activity.  Eating habits.  History of falls.  Memory and ability to understand (cognition).  Work and work environment. Screening  You may have the following tests or measurements:  Height, weight, and BMI.  Blood pressure.  Lipid and cholesterol levels. These may be checked every 5 years, or more frequently if you are over 50 years old.  Skin check.  Lung cancer screening. You may have this screening every year starting at age 55 if you have a 30-pack-year history of smoking and currently smoke or have quit within the past 15 years.  Fecal occult blood test (FOBT) of the stool. You may have this test every year starting at age 50.  Flexible sigmoidoscopy or colonoscopy. You may have a sigmoidoscopy every 5 years or a colonoscopy every 10  years starting at age 50.  Prostate cancer screening. Recommendations will vary depending on your family history and other risks.  Hepatitis C blood test.  Hepatitis B blood test.  Sexually transmitted disease (STD) testing.  Diabetes screening. This is done by checking your blood sugar (glucose) after you have not eaten for a while (fasting). You may have this done every 1-3 years.  Abdominal aortic aneurysm (AAA) screening. You may need this if you are a current or former smoker.  Osteoporosis. You may be screened starting at age 70 if you are at high risk. Talk with your health care provider about your test results, treatment options, and if necessary, the need for more tests. Vaccines  Your health care provider may recommend certain vaccines, such as:  Influenza vaccine. This is recommended every year.  Tetanus, diphtheria, and acellular pertussis (Tdap, Td) vaccine. You may need a Td booster every 10 years.  Varicella vaccine. You may need this if you have not been vaccinated.  Zoster vaccine. You may need this after age 60.  Measles, mumps, and rubella (MMR) vaccine. You may need at least one dose of MMR if you were born in 1957 or later. You may also need a second dose.  Pneumococcal 13-valent conjugate (PCV13) vaccine. One dose is recommended after age 81.  Pneumococcal polysaccharide (PPSV23) vaccine. One dose is recommended after age 81.  Meningococcal vaccine. You may need this if you have certain conditions.  Hepatitis A vaccine. You may need this if you have certain conditions or if you travel or work in places where   you may be exposed to hepatitis A.  Hepatitis B vaccine. You may need this if you have certain conditions or if you travel or work in places where you may be exposed to hepatitis B.  Haemophilus influenzae type b (Hib) vaccine. You may need this if you have certain risk factors. Talk to your health care provider about which screenings and vaccines you  need and how often you need them. This information is not intended to replace advice given to you by your health care provider. Make sure you discuss any questions you have with your health care provider. Document Released: 03/12/2015 Document Revised: 11/03/2015 Document Reviewed: 12/15/2014 Elsevier Interactive Patient Education  2017 [employer].

## 2016-03-21 NOTE — Telephone Encounter (Signed)
Patient was here for today for CPE and asked about prolia.  Not sure if the insurance was approved.  Can we please check the status and give patient a call to let him know the status.    Blood work done for [employer].

## 2016-03-21 NOTE — Progress Notes (Signed)
Patient ID: Rick [name], male    DOB: [birth year]  Age: 83 y.o. MRN: [id number]    Subjective:  Subjective  HPI Rick [name] presents for cpe and prolia-- no  complaints  Review of Systems  Constitutional: Negative for appetite change, diaphoresis, fatigue and unexpected weight change.  Eyes: Negative for pain, redness and visual disturbance.  Respiratory: Negative for cough, chest tightness, shortness of breath and wheezing.   Cardiovascular: Negative for chest pain, palpitations and leg swelling.  Endocrine: Negative for cold intolerance, heat intolerance, polydipsia, polyphagia and polyuria.  Genitourinary: Negative for difficulty urinating, dysuria and frequency.  Neurological: Negative for dizziness, light-headedness, numbness and headaches.    History Past Medical History:  Diagnosis Date  . BPH (benign prostatic hyperplasia)   . Carotid artery occlusion   . Chronic ear infection    left  . Hx of colonic polyps   . Hyperlipidemia   . Hypertension   . Kidney stones   . Osteoporosis     He has a past surgical history that includes Tonsillectomy.   His family history includes Seizures in his father; Stroke in his mother.He reports that he has been smoking Cigarettes.  He has a 25.00 pack-year smoking history. He has never used smokeless tobacco. He reports that he does not drink alcohol or use drugs.  Current Outpatient Prescriptions on File Prior to Visit  Medication Sig Dispense Refill  . atorvastatin (LIPITOR) 20 MG tablet TAKE 1 TABLET BY MOUTH EVERY DAY 90 tablet 1  . Calcium-Vitamin D (CALTRATE 600 PLUS-VIT D PO) Take 600 mg by mouth 2 (two) times daily.     . clopidogrel (PLAVIX) 75 MG tablet TAKE 1 TABLET BY MOUTH EVERY DAY 90 tablet 3  . Cranberry-Cholecalciferol 4200-500 MG-UNIT CAPS Take 1 capsule by mouth daily.    . cyanocobalamin 100 MCG tablet Take 100 mcg by mouth daily.    Marland Kitchen denosumab (PROLIA) 60 MG/ML SOLN injection Inject 60 mg into the skin every  6 (six) months. Administer in upper arm, thigh, or abdomen    . MULTIPLE VITAMIN PO Take by mouth.      . Saw Palmetto 450 MG CAPS Take by mouth.      Marland Kitchen VITAMIN D, CHOLECALCIFEROL, PO Take by mouth.       No current facility-administered medications on file prior to visit.      Objective:  Objective  Physical Exam  Constitutional: He is oriented to person, place, and time. He appears well-developed and well-nourished. No distress.  HENT:  Head: Normocephalic and atraumatic.  Right Ear: External ear normal.  Left Ear: External ear normal.  Nose: Nose normal.  Mouth/Throat: Oropharynx is clear and moist. No oropharyngeal exudate.  Eyes: Conjunctivae and EOM are normal. Pupils are equal, round, and reactive to light. Right eye exhibits no discharge. Left eye exhibits no discharge.  Neck: Normal range of motion. Neck supple. No JVD present. No thyromegaly present.  Cardiovascular: Normal rate, regular rhythm and intact distal pulses.   No murmur heard. Pulmonary/Chest: Effort normal and breath sounds normal. No respiratory distress. He has no wheezes. He has no rales. He exhibits no tenderness.  Abdominal: Soft. Bowel sounds are normal. He exhibits no distension and no mass. There is no tenderness. There is no rebound and no guarding.  Genitourinary:  Genitourinary Comments: urology  Musculoskeletal: Normal range of motion. He exhibits no edema or tenderness.  Lymphadenopathy:    He has no cervical adenopathy.  Neurological: He is alert and oriented  to person, place, and time. He displays normal reflexes. No cranial nerve deficit. He exhibits normal muscle tone.  Skin: Skin is warm and dry. No rash noted. He is not diaphoretic. No erythema.  Psychiatric: He has a normal mood and affect. His behavior is normal. Judgment and thought content normal.  Nursing note and vitals reviewed.  BP (!) 110/58 (BP Location: Right Arm, Cuff Size: Normal)   Pulse 70   Temp 98.1 F (36.7 C)  Wt  Readings from Last 3 Encounters:  09/27/15 132 lb (59.9 kg)  06/29/15 133 lb 11.2 oz (60.6 kg)  03/19/15 133 lb 6.4 oz (60.5 kg)     Lab Results  Component Value Date   WBC 7.8 03/21/2016   HGB 13.3 03/21/2016   HCT 39.1 03/21/2016   PLT 260.0 03/21/2016   GLUCOSE 105 (H) 03/21/2016   CHOL 147 03/21/2016   TRIG 90.0 03/21/2016   HDL 56.60 03/21/2016   LDLCALC 73 03/21/2016   ALT 15 03/21/2016   AST 19 03/21/2016   NA 140 03/21/2016   K 4.1 03/21/2016   CL 103 03/21/2016   CREATININE 0.99 03/21/2016   BUN 14 03/21/2016   CO2 33 (H) 03/21/2016   TSH 0.71 04/14/2010   PSA 5.00 (H) 03/06/2014    Dg Bone Density  Result Date: 10/05/2015 EXAM: DUAL X-RAY ABSORPTIOMETRY (DXA) FOR BONE MINERAL DENSITY IMPRESSION: Referring Physician:  Rosalita Chessman CHASE PATIENT: Name: Rick, [name] Patient ID: [id number] Birth Date: 09/05/1933 Height: 72.0 in. Sex: Male Measured: 10/05/2015 Weight: 130.0 lbs. Indications: Advanced Age, Caucasian, Tobacco User(Current) Fractures: Wrist Treatments: Calcium, Vitamin D ASSESSMENT: The BMD measured at AP Spine L1-L4 is 0.901 g/cm2 with a T-score of -2.3. This patient is considered osteopenic according to [facility]) criteria. Site Region Measured Date Measured Age WHO YA BMD Classification T-score AP Spine L1-L4 10/05/2015 80.4 Osteopenia -2.3 0.901 g/cm2 DualFemur Neck Left 10/05/2015 80.4 years Osteopenia -2.1 0.747 g/cm2 World Health Organization [facility]) criteria for post-menopausal, Caucasian Women: Normal       T-score at or above -1 SD Osteopenia   T-score between -1 and -2.5 SD Osteoporosis T-score at or below -2.5 [place] recommends that FDA-approved medical therapies be considered in postmenopausal women and men age 57 or older with a: 1. Hip or vertebral (clinical or morphometric) fracture. 2. T-score of < -2.5 at the spine or hip. 3. Ten-year fracture probability by FRAX of 3% or greater for hip  fracture or 20% or greater for major osteoporotic fracture. All treatment decisions require clinical judgment and consideration of individual patient factors, including patient preferences, co-morbidities, previous drug use, risk factors not captured in the FRAX model (e.g. falls, vitamin D deficiency, increased bone turnover, interval significant decline in bone density) and possible under - or over-estimation of fracture risk by FRAX. All patients should ensure an adequate intake of dietary calcium (1200 mg/d) and vitamin D (800 IU daily) unless contraindicated. FOLLOW-UP: People with diagnosed cases of osteoporosis or at high risk for fracture should have regular bone mineral density tests. For patients eligible for Medicare, routine testing is allowed once every 2 years. The testing frequency can be increased to one year for patients who have rapidly progressing disease, those who are receiving or discontinuing medical therapy to restore bone mass, or have additional risk factors. I have reviewed this report and agree with the above findings.  Radiology Patient: Gearldine Bienenstock Referring Physician: Rosalita Chessman CHASE Birth Date: 31-Dec-1933 [age]  [age] years Patient ID: [id number] Height: 72.0 in. Weight: 130.0 lbs. Measured: 10/05/2015 1:50:46 PM (16 SP 2) Sex: Male Ethnicity: White Analyzed: 10/05/2015 1:54:55 PM (16 SP 2) FRAX* 10-year Probability of Fracture Based on femoral neck BMD: DualFemur (Left) Major Osteoporotic Fracture: 12.7% Hip Fracture:                4.2% Population:                  [place] (Caucasian) Risk Factors:                None *FRAX is a Materials engineer of the [employer] for Metabolic Bone Disease, a World Pharmacologist (WHO) [facility]. ASSESSMENT: The probability of a major osteoporotic fracture is 12.7% within the next ten years. The probability of a hip fracture is 4.2% within the next ten years. Electronically Signed   By:  Julian Hy M.D.   On: 10/05/2015 14:44     Assessment & Plan:  Plan  I am having Mr. Nevius maintain his Calcium-Vitamin D (CALTRATE 600 PLUS-VIT D PO), MULTIPLE VITAMIN PO, Saw Palmetto, (VITAMIN D, CHOLECALCIFEROL, PO), cyanocobalamin, denosumab, Cranberry-Cholecalciferol, clopidogrel, and atorvastatin.  No orders of the defined types were placed in this encounter.   Problem List Items Addressed This Visit      Unprioritized   Carotid stenosis   Relevant Orders   Lipid panel (Completed)   CBC with Differential/Platelet (Completed)   Comprehensive metabolic panel (Completed)   Hyperlipidemia   Relevant Orders   Lipid panel (Completed)   CBC with Differential/Platelet (Completed)   Comprehensive metabolic panel (Completed)   Osteoporosis   Relevant Orders   Lipid panel (Completed)   CBC with Differential/Platelet (Completed)   Comprehensive metabolic panel (Completed)   Medicare annual wellness visit, subsequent - Primary    ghm utd Check labs See AVS      Relevant Orders   POCT Urinalysis Dipstick (Automated) (Completed)    Other Visit Diagnoses    Enlarged prostate       Relevant Orders   POCT Urinalysis Dipstick (Automated) (Completed)   Abnormal urine       Relevant Orders   Urine Culture   Leukocytes in urine       Relevant Orders   Urine Culture      Follow-up: No Follow-up on file.  Ann Held, DO    +

## 2016-03-21 NOTE — Progress Notes (Signed)
Pre visit review using our clinic review tool, if applicable. No additional management support is needed unless otherwise documented below in the visit note. 

## 2016-03-22 DIAGNOSIS — Z0000 Encounter for general adult medical examination without abnormal findings: Secondary | ICD-10-CM | POA: Insufficient documentation

## 2016-03-22 LAB — URINE CULTURE: Organism ID, Bacteria: NO GROWTH

## 2016-03-22 NOTE — Assessment & Plan Note (Signed)
ghm utd Check labs See AVS 

## 2016-03-27 NOTE — Telephone Encounter (Signed)
Patient is requesting an update on the status of the Prolia. He would like to get the appointment scheduled. Please advise  Phone: [phone]

## 2016-03-27 NOTE — Telephone Encounter (Signed)
Notes Recorded by Ann Held, DO on 03/25/2016 at 7:59 PM EST Great!  Ok for prolia when ins approves it Repeat labs 6 months

## 2016-03-27 NOTE — Telephone Encounter (Signed)
Hi do you have update on patients prolia injections   pC

## 2016-03-28 NOTE — Telephone Encounter (Signed)
Went to Rick [name] to inquire of this PA. She has been working on it and keeps getting faxed back with no response/questions. She will call the number on the back of his card to followup.  States has spent a lot of time trying to get approved, but has been difficult. Will let us know once done.

## 2016-03-30 NOTE — Telephone Encounter (Signed)
Patient called back today 03/30/2016 to inquire on prolia PA. Informed him of manager difficulty with his insurance and she is still working on it. He did give me another Pensions consultant service number which I wrote on a sticky note and gave to [place].   Informed him that hopefully will be completed soon and will call him once done.

## 2016-03-31 NOTE — Telephone Encounter (Signed)
Called [employer] number provided and was given [phone] to fax patients PA form too. Form has been faxed awaiting response from insurance

## 2016-04-07 NOTE — Telephone Encounter (Signed)
Patient is calling to follow up on the status of the prolia. Please advise

## 2016-04-11 NOTE — Telephone Encounter (Signed)
Will call patient once Prolia is here.   Gilmore Laroche can you please order prolia and let me know when available. Thanks, PC

## 2016-04-11 NOTE — Telephone Encounter (Signed)
Finally received verification from [place] at [employer] that no PA is required for Prolia injection, can you please get Rick [name] scheduled

## 2016-04-11 NOTE — Telephone Encounter (Signed)
Called patient toschedule Prolia Injection as per note below. Patient wants to make sure that the Prolia is here before he comes in. Wants to come in on Friday morning. Plse adv

## 2016-04-14 ENCOUNTER — Other Ambulatory Visit: Payer: Self-pay | Admitting: *Deleted

## 2016-04-14 ENCOUNTER — Telehealth: Payer: Self-pay

## 2016-04-14 ENCOUNTER — Ambulatory Visit (INDEPENDENT_AMBULATORY_CARE_PROVIDER_SITE_OTHER): Payer: Medicare Other | Admitting: Behavioral Health

## 2016-04-14 DIAGNOSIS — M810 Age-related osteoporosis without current pathological fracture: Secondary | ICD-10-CM | POA: Diagnosis not present

## 2016-04-14 MED ORDER — ZOSTER VAC RECOMB ADJUVANTED 50 MCG/0.5ML IM SUSR: 1.0000 mL | Freq: Once | INTRAMUSCULAR | 1 refills | Status: AC

## 2016-04-14 MED ORDER — DENOSUMAB 60 MG/ML ~~LOC~~ SOLN
60.0000 mg | Freq: Once | SUBCUTANEOUS | Status: AC
  Administered 2016-04-14: 60 mg via SUBCUTANEOUS

## 2016-04-14 NOTE — Telephone Encounter (Signed)
Prolia has been approved.

## 2016-04-14 NOTE — Progress Notes (Signed)
Pre visit review using our clinic review tool, if applicable. No additional management support is needed unless otherwise documented below in the visit note.  Patient came in clinic today for Prolia injection. SQ given in Right Arm. Patient tolerated injection well. No signs or symptoms of a reaction before leaving the nurse visit.

## 2016-04-14 NOTE — Telephone Encounter (Signed)
Called patient to schedule appointment for Nurse visit for Prolia injection.  Patient did not answer the phone will call back or if he does call back please put him on the nurses schedule for a Prolia injection.  Thanks PC

## 2016-04-17 NOTE — Telephone Encounter (Signed)
Prolia was given to pt on 04/14/16.

## 2016-07-11 ENCOUNTER — Telehealth: Payer: Self-pay | Admitting: Family Medicine

## 2016-07-11 ENCOUNTER — Encounter: Payer: Self-pay | Admitting: Family

## 2016-07-11 ENCOUNTER — Other Ambulatory Visit: Payer: Self-pay | Admitting: Family Medicine

## 2016-07-11 DIAGNOSIS — R0989 Other specified symptoms and signs involving the circulatory and respiratory systems: Secondary | ICD-10-CM

## 2016-07-11 NOTE — Telephone Encounter (Signed)
Relation to SW:NIOE Call back number:[phone]   Reason for call:  Patient requesting CAROTID orders for [facility], please advise

## 2016-07-11 NOTE — Telephone Encounter (Signed)
Patient informed. 

## 2016-07-11 NOTE — Telephone Encounter (Signed)
Order placed

## 2016-07-18 ENCOUNTER — Ambulatory Visit: Payer: Medicare Other | Admitting: Family

## 2016-07-18 ENCOUNTER — Encounter (HOSPITAL_COMMUNITY): Payer: Medicare Other

## 2016-07-19 ENCOUNTER — Ambulatory Visit (HOSPITAL_BASED_OUTPATIENT_CLINIC_OR_DEPARTMENT_OTHER)
Admission: RE | Admit: 2016-07-19 | Discharge: 2016-07-19 | Disposition: A | Discharge status: Home / Self Care | Payer: Medicare Other | Source: Ambulatory Visit | Attending: Family Medicine | Admitting: Family Medicine

## 2016-07-19 DIAGNOSIS — I6523 Occlusion and stenosis of bilateral carotid arteries: Secondary | ICD-10-CM | POA: Diagnosis not present

## 2016-07-19 DIAGNOSIS — R0989 Other specified symptoms and signs involving the circulatory and respiratory systems: Secondary | ICD-10-CM | POA: Insufficient documentation

## 2016-07-19 DIAGNOSIS — I6521 Occlusion and stenosis of right carotid artery: Secondary | ICD-10-CM | POA: Diagnosis not present

## 2016-07-26 ENCOUNTER — Telehealth: Payer: Self-pay | Admitting: Family Medicine

## 2016-07-26 NOTE — Telephone Encounter (Signed)
Relation to IR:WERX Call back [phone]   Reason for call:  Patient returning call regarding imaging results, please advise

## 2016-07-26 NOTE — Telephone Encounter (Signed)
Patient informed of results.  

## 2016-07-27 ENCOUNTER — Other Ambulatory Visit: Payer: Self-pay | Admitting: Family Medicine

## 2016-07-27 DIAGNOSIS — I6523 Occlusion and stenosis of bilateral carotid arteries: Secondary | ICD-10-CM

## 2016-07-31 ENCOUNTER — Telehealth: Payer: Self-pay | Admitting: Family

## 2016-07-31 NOTE — Telephone Encounter (Signed)
-----   Message from [place], NP sent at 07/26/2016  4:27 PM EDT ----- Regarding: FW: increased left ICA systolic velocity Please call pt and schedule him for carotid duplex in 2-4 weeks, see me afterward. It appears that he may have been advised by Dr. Ivy Lynn to follow up with [place], so he may be aware that he needs to follow up with [place]. Thank you, Vinnie Level  ----- Message ----- From: Waynetta Sandy, MD Sent: 07/25/2016   8:38 PM To: Sharmon Leyden Nickel, NP Subject: RE: increased left ICA systolic velocity       I suppose he should see someone soon and we can hopefully have a record of his duplex at that time to determine his actual %.   bc ----- Message ----- From: Viann Fish, NP Sent: 07/25/2016   4:58 PM To: Sharmon Leyden Nickel, NP, # Subject: increased left ICA systolic velocity           Dr. Donzetta Matters, This pt has been followed by Dr. Kellie Simmering for carotid stenosis. He has no hx of a CEA, no recorded hx of stroke or TIA since I last saw him in May of 2017, no diabetes, but he is a smoker.  I received this carotid duplex result from [place], with a comment from Dr. Ivy Lynn that pt needs to follow up with vascular. On the carotid duplex report there are no end diastolic velocities documented.   IMPRESSION: Less than 50% stenosis in the right internal carotid artery. There is plaque in the right common, internal, and external carotid artery as described above. Peak systolic velocity in the left internal carotid artery bulb has increased since the prior study and now indicates greater than 70% stenosis. Again, there is plaque within the left common carotid artery, bulb, and internal carotid artery.  And I don't know if he has had a stroke, there are no indications in the records on file that he has had a stroke or TIA.  Should he be scheduled to see me in 6 months with a carotid duplex or see you soon?  Thank you, Vinnie Level  ----- Message ----- From:  Damita Dunnings, [place] Sent: 07/25/2016   2:33 PM To: Sharmon Leyden Nickel, NP  Forwarding to vascular.

## 2016-07-31 NOTE — Telephone Encounter (Signed)
Sched lab 08/14/16 at 10:30 and NP 08/18/16 at 11:15. Lm on hm# for pt to confirm appts.

## 2016-08-03 DIAGNOSIS — Z85828 Personal history of other malignant neoplasm of skin: Secondary | ICD-10-CM | POA: Diagnosis not present

## 2016-08-03 DIAGNOSIS — L570 Actinic keratosis: Secondary | ICD-10-CM | POA: Diagnosis not present

## 2016-08-03 DIAGNOSIS — D225 Melanocytic nevi of trunk: Secondary | ICD-10-CM | POA: Diagnosis not present

## 2016-08-03 DIAGNOSIS — L814 Other melanin hyperpigmentation: Secondary | ICD-10-CM | POA: Diagnosis not present

## 2016-08-07 ENCOUNTER — Encounter: Payer: Self-pay | Admitting: Family

## 2016-08-10 ENCOUNTER — Other Ambulatory Visit: Payer: Self-pay

## 2016-08-10 DIAGNOSIS — I6523 Occlusion and stenosis of bilateral carotid arteries: Secondary | ICD-10-CM

## 2016-08-14 ENCOUNTER — Inpatient Hospital Stay (HOSPITAL_COMMUNITY): Admission: RE | Admit: 2016-08-14 | Payer: Medicare Other | Source: Ambulatory Visit

## 2016-08-17 DIAGNOSIS — I6523 Occlusion and stenosis of bilateral carotid arteries: Secondary | ICD-10-CM | POA: Diagnosis not present

## 2016-08-17 DIAGNOSIS — F17200 Nicotine dependence, unspecified, uncomplicated: Secondary | ICD-10-CM | POA: Diagnosis not present

## 2016-08-18 ENCOUNTER — Ambulatory Visit: Payer: Medicare Other | Admitting: Family

## 2016-08-21 ENCOUNTER — Other Ambulatory Visit: Payer: Self-pay | Admitting: Vascular Surgery

## 2016-09-06 ENCOUNTER — Other Ambulatory Visit: Payer: Self-pay | Admitting: Family Medicine

## 2016-09-08 ENCOUNTER — Other Ambulatory Visit: Payer: Self-pay | Admitting: Family Medicine

## 2016-09-08 ENCOUNTER — Telehealth: Payer: Self-pay | Admitting: Family Medicine

## 2016-09-08 DIAGNOSIS — M810 Age-related osteoporosis without current pathological fracture: Secondary | ICD-10-CM

## 2016-09-08 NOTE — Telephone Encounter (Signed)
Patient returning call, request call back after 4pm at [phone]

## 2016-09-08 NOTE — Telephone Encounter (Signed)
Patient informed of information Scheduled nurse visit on 10/13/2016 He is seeing PCP on 09/18/2016 and will get lab done then. I put in the order for a bmp (future)

## 2016-09-08 NOTE — Telephone Encounter (Signed)
Last bone density 10/05/15 Last bmp 08/17/2015 Called both numbers left message to call back (needs bmp done if wants to proceed with Prolia).

## 2016-09-08 NOTE — Telephone Encounter (Signed)
Prolia Benefits verified °NO PA required °$183 deductible-met °20% co-insurance Admin and Prolia (should be covered by BCBS) ° °Patient may owe Approximately $0 OOP ° °Due after 10/11/16  °

## 2016-09-18 ENCOUNTER — Encounter: Payer: Self-pay | Admitting: Family Medicine

## 2016-09-18 ENCOUNTER — Ambulatory Visit (INDEPENDENT_AMBULATORY_CARE_PROVIDER_SITE_OTHER): Payer: Medicare Other | Admitting: Family Medicine

## 2016-09-18 VITALS — BP 122/56 | HR 58 | Temp 98.3°F | Resp 16 | Ht 74.0 in | Wt 127.4 lb

## 2016-09-18 DIAGNOSIS — I6523 Occlusion and stenosis of bilateral carotid arteries: Secondary | ICD-10-CM

## 2016-09-18 DIAGNOSIS — I6529 Occlusion and stenosis of unspecified carotid artery: Secondary | ICD-10-CM | POA: Diagnosis not present

## 2016-09-18 DIAGNOSIS — E785 Hyperlipidemia, unspecified: Secondary | ICD-10-CM

## 2016-09-18 DIAGNOSIS — I10 Essential (primary) hypertension: Secondary | ICD-10-CM | POA: Diagnosis not present

## 2016-09-18 NOTE — Assessment & Plan Note (Signed)
Tolerating statin, encouraged heart healthy diet, avoid trans fats, minimize simple carbs and saturated fats. Increase exercise as tolerated 

## 2016-09-18 NOTE — Assessment & Plan Note (Addendum)
ultrasound carotid artery----worsened >70% stenosis US Carotid Bilateral (Accession [id number]) (Order [id number])  Imaging  Date: 07/19/2016 Department: [facility] [place] Released By: Katha Hamming Authorizing: Ann Held, DO  Exam Information   Status Exam Begun  Exam Ended   Final [99] 07/19/2016 4:18 PM 07/19/2016 6:01 PM  PACS Images   Show images for US Carotid Bilateral  Study Result   CLINICAL DATA:  History of carotid stenosis  EXAM: BILATERAL CAROTID DUPLEX ULTRASOUND  TECHNIQUE: Pearline Cables scale imaging, color Doppler and duplex ultrasound were performed of bilateral carotid and vertebral arteries in the neck.  COMPARISON:  06/08/2014  FINDINGS: Criteria: Quantification of carotid stenosis is based on velocity parameters that correlate the residual internal carotid diameter with NASCET-based stenosis levels, using the diameter of the distal internal carotid lumen as the denominator for stenosis measurement.  The following velocity measurements were obtained:  RIGHT  ICA:  106 cm/sec  CCA:  93 cm/sec  SYSTOLIC ICA/CCA RATIO:  1.2  DIASTOLIC ICA/CCA RATIO:  2.6  ECA:  134 cm/sec  LEFT  ICA:  315 cm/sec  CCA:  891 cm/sec  SYSTOLIC ICA/CCA RATIO:  3.7  DIASTOLIC ICA/CCA RATIO:  5.3  ECA:  127 cm/sec  RIGHT CAROTID ARTERY: Mild calcified plaque in the upper common carotid. Moderate predominately soft plaque in the bulb. There is significant calcified plaque in the proximal external carotid artery. Low resistance internal carotid Doppler pattern is preserved. There is calcified plaque in the lower internal carotid artery.  RIGHT VERTEBRAL ARTERY:  Antegrade.  LEFT CAROTID ARTERY: There is moderate predominately soft plaque in the bulb. Velocities are markedly elevated. There is extensive calcified plaque in the lower internal carotid artery. Low resistance internal carotid Doppler  pattern is preserved.  LEFT VERTEBRAL ARTERY:  Antegrade.  IMPRESSION: Less than 50% stenosis in the right internal carotid artery. There is plaque in the right common, internal, and external carotid artery as described above.  Peak systolic velocity in the left internal carotid artery bulb has increased since the prior study and now indicates greater than 70% stenosis. Again, there is plaque within the left common carotid artery, bulb, and internal carotid artery.   Electronically Signed   By: Marybelle Killings M.D.   On: 07/20/2016 07:46   Result Notes   Notes recorded by Ewing, Robin B, CMA on 07/27/2016 at 9:30 AM EDT Yes ------  Notes recorded by Ann Held, DO on 07/26/2016 at 7:35 PM EDT There is no one in our building---- They are the only office in [place] -- check with [name] or Drue Dun ------  Notes recorded by Ewing, Donell Sievert, CMA on 07/26/2016 at 3:44 PM EDT Spoke to the patient informed of results/instructions. He is ok going back to vascular, but previously went to DR. Kellie Simmering on [facility] -- he stated he does not want to go that far into [place]--would like our building or something a little closer ------  Notes recorded by Doylene Canning, CMA on 07/26/2016 at 12:02 PM EDT Left message on machine to call back  ------  Notes recorded by Damita Dunnings, [place] on 07/25/2016 at 2:33 PM EDT Forwarding to vascular. ------  Notes recorded by Ann Held, DO on 07/22/2016 at 11:25 AM EDT Please forward to Vascular surgery Stenosis has worsened-- he will need f/u with vascular surgeon      Vitals   Height Weight BMI (Calculated)      External Result Report  External Result Report  Imaging   Imaging Information  Signed by   Signed Date/Time  Phone Pager  Marybelle Killings 07/20/2016 7:46 AM [phone]  Result Notes   Notes recorded by Ewing, Robin B, CMA on 07/27/2016 at 9:30 AM EDT Yes ------  Notes recorded by Ann Held, DO on 07/26/2016 at 7:35 PM EDT There is no one in our building---- They are the only office in [place] -- check with [name] or Drue Dun ------  Notes recorded by Ewing, Donell Sievert, CMA on 07/26/2016 at 3:44 PM EDT Spoke to the patient informed of results/instructions. He is ok going back to vascular, but previously went to DR. Kellie Simmering on [facility]. -- he stated he does not want to go that far into [place]--would like our building or something a little closer ------  Notes recorded by Doylene Canning, CMA on 07/26/2016 at 12:02 PM EDT Left message on machine to call back  ------  Notes recorded by Damita Dunnings, [place] on 07/25/2016 at 2:33 PM EDT Forwarding to vascular. ------  Notes recorded by Ann Held, DO on 07/22/2016 at 11:25 AM EDT Please forward to Vascular surgery Stenosis has worsened-- he will need f/u with vascular surgeon      Signed   Electronically signed by Marybelle Killings, MD on 07/20/16 at 0746 EDT  Study Notes     Orbie Pyo, RT on 07/19/2016 6:02 PM  F/U bilateral carotid plaque    Original Order   Ordered On Ordered By   07/11/2016 4:31 PM Ann Held, DO        Pt was upset by physician at [facility]-- he waited over 2 hours and then was told the dr was not coming ---- then received $300 bill and he never saw the physician

## 2016-09-18 NOTE — Progress Notes (Signed)
Patient ID: Rick [name], male   DOB: 1933/11/26, 83 y.o.   MRN: [id number]      Subjective:  I acted as a Education administrator for Dr. Carollee Herter.  [name], [place]   Patient ID: Rick [name], male    DOB: 18-Mar-1933, 83 y.o.   MRN: [id number]  Chief Complaint  Patient presents with  . Hypertension  . Hyperlipidemia    HPI  Patient is in today for follow up blood pressure and cholesterol.  He has been doing well on current treatment with no side effects.  Patient Care Team: Carollee Herter, Alferd Apa, DO as PCP - General Lowella Bandy, MD as Consulting Physician (Urology) Mal Misty, MD as Consulting Physician (Vascular Surgery) Jari Pigg, MD as Consulting Physician (Dermatology) Calvert Cantor, MD as Consulting Physician (Ophthalmology)   Past Medical History:  Diagnosis Date  . BPH (benign prostatic hyperplasia)   . Carotid artery occlusion   . Chronic ear infection    left  . Hx of colonic polyps   . Hyperlipidemia   . Hypertension   . Kidney stones   . Osteoporosis     Past Surgical History:  Procedure Laterality Date  . TONSILLECTOMY      Family History  Problem Relation Age of Onset  . Stroke Mother   . Seizures Father     Social History   Social History  . Marital status: Married    Spouse name: N/A  . Number of children: N/A  . Years of education: N/A   Occupational History  . retired--[employer]    Social History Main Topics  . Smoking status: Light Tobacco Smoker    Packs/day: 0.50    Years: 50.00    Types: Cigarettes  . Smokeless tobacco: Never Used  . Alcohol use No  . Drug use: No  . Sexual activity: Not on file   Other Topics Concern  . Not on file   Social History Narrative   Retired- [employer]   Married   Regular exercise- yes    Outpatient Medications Prior to Visit  Medication Sig Dispense Refill  . atorvastatin (LIPITOR) 20 MG tablet TAKE 1 TABLET BY MOUTH EVERY DAY 90 tablet 0  . Calcium-Vitamin D (CALTRATE 600 PLUS-VIT D PO) Take 600 mg by  mouth 2 (two) times daily.     . clopidogrel (PLAVIX) 75 MG tablet TAKE 1 TABLET BY MOUTH EVERY DAY 90 tablet 3  . Cranberry-Cholecalciferol 4200-500 MG-UNIT CAPS Take 1 capsule by mouth daily.    . cyanocobalamin 100 MCG tablet Take 100 mcg by mouth daily.    Marland Kitchen denosumab (PROLIA) 60 MG/ML SOLN injection Inject 60 mg into the skin every 6 (six) months. Administer in upper arm, thigh, or abdomen    . MULTIPLE VITAMIN PO Take by mouth.      . Saw Palmetto 450 MG CAPS Take by mouth.      Marland Kitchen VITAMIN D, CHOLECALCIFEROL, PO Take by mouth.       No facility-administered medications prior to visit.     Allergies  Allergen Reactions  . Aspirin Hives and Itching  . Penicillins Hives and Itching    Review of Systems  Constitutional: Negative for fever and malaise/fatigue.  HENT: Negative for congestion.   Eyes: Negative for blurred vision.  Respiratory: Negative for cough and shortness of breath.   Cardiovascular: Negative for chest pain, palpitations and leg swelling.  Gastrointestinal: Negative for vomiting.  Musculoskeletal: Negative for back pain.  Skin: Negative for rash.  Neurological:  Negative for loss of consciousness and headaches.       Objective:    Physical Exam  Constitutional: He appears well-developed and well-nourished. No distress.  HENT:  Head: Normocephalic and atraumatic.  Eyes: Conjunctivae are normal.  Neck: Normal range of motion. Carotid bruit is not present. No thyromegaly present.  Cardiovascular: Normal rate and regular rhythm.   Pulmonary/Chest: Effort normal. He has no wheezes.  Abdominal: Soft. Bowel sounds are normal. There is no tenderness.  Musculoskeletal: Normal range of motion. He exhibits no edema or deformity.  Neurological: He is alert.  Skin: Skin is warm and dry. He is not diaphoretic.  Psychiatric: He has a normal mood and affect.  Nursing note and vitals reviewed.   BP (!) 122/56 (BP Location: Right Arm, Cuff Size: Normal)   Pulse (!)  58   Temp 98.3 F (36.8 C) (Oral)   Resp 16   Ht 6\' 2"  (1.88 m)   Wt 127 lb 6.4 oz (57.8 kg)   SpO2 93%   BMI 16.36 kg/m  Wt Readings from Last 3 Encounters:  09/18/16 127 lb 6.4 oz (57.8 kg)  09/27/15 132 lb (59.9 kg)  06/29/15 133 lb 11.2 oz (60.6 kg)   BP Readings from Last 3 Encounters:  09/18/16 (!) 122/56  03/21/16 (!) 110/58  09/27/15 122/60     Immunization History  Administered Date(s) Administered  . Influenza Whole 11/28/1998, 12/25/2006, 12/09/2008, 11/14/2010, 11/20/2011, 11/20/2012  . Influenza-Unspecified 01/11/2014, 10/29/2014, 11/28/2015  . Pneumococcal Conjugate-13 03/06/2014  . Pneumococcal Polysaccharide-23 05/22/2002  . Td 12/01/1996, 03/15/2005  . Tdap 05/31/2015  . Zoster 09/13/2005, 03/15/2006, 05/29/2006    Health Maintenance  Topic Date Due  . INFLUENZA VACCINE  09/27/2016  . DEXA SCAN  10/04/2017  . TETANUS/TDAP  05/30/2025  . PNA vac Low Risk Adult  Completed    Lab Results  Component Value Date   WBC 7.8 03/21/2016   HGB 13.3 03/21/2016   HCT 39.1 03/21/2016   PLT 260.0 03/21/2016   GLUCOSE 105 (H) 03/21/2016   CHOL 147 03/21/2016   TRIG 90.0 03/21/2016   HDL 56.60 03/21/2016   LDLCALC 73 03/21/2016   ALT 15 03/21/2016   AST 19 03/21/2016   NA 140 03/21/2016   K 4.1 03/21/2016   CL 103 03/21/2016   CREATININE 0.99 03/21/2016   BUN 14 03/21/2016   CO2 33 (H) 03/21/2016   TSH 0.71 04/14/2010   PSA 5.00 (H) 03/06/2014    Lab Results  Component Value Date   TSH 0.71 04/14/2010   Lab Results  Component Value Date   WBC 7.8 03/21/2016   HGB 13.3 03/21/2016   HCT 39.1 03/21/2016   MCV 95.4 03/21/2016   PLT 260.0 03/21/2016   Lab Results  Component Value Date   NA 140 03/21/2016   K 4.1 03/21/2016   CO2 33 (H) 03/21/2016   GLUCOSE 105 (H) 03/21/2016   BUN 14 03/21/2016   CREATININE 0.99 03/21/2016   BILITOT 0.3 03/21/2016   ALKPHOS 80 03/21/2016   AST 19 03/21/2016   ALT 15 03/21/2016   PROT 6.9 03/21/2016    ALBUMIN 4.0 03/21/2016   CALCIUM 9.8 03/21/2016   GFR 77.13 03/21/2016   Lab Results  Component Value Date   CHOL 147 03/21/2016   Lab Results  Component Value Date   HDL 56.60 03/21/2016   Lab Results  Component Value Date   LDLCALC 73 03/21/2016   Lab Results  Component Value Date   TRIG 90.0 03/21/2016  Lab Results  Component Value Date   CHOLHDL 3 03/21/2016   No results found for: HGBA1C       Assessment & Plan:   Problem List Items Addressed This Visit      Unprioritized   Carotid stenosis    ultrasound carotid artery----worsened >70% stenosis US Carotid Bilateral (Accession [id number]) (Order [id number])  Imaging  Date: 07/19/2016 Department: [facility] [place] Released By: Katha Hamming Authorizing: Ann Held, DO  Exam Information   Status Exam Begun  Exam Ended   Final [99] 07/19/2016 4:18 PM 07/19/2016 6:01 PM  PACS Images   Show images for US Carotid Bilateral  Study Result   CLINICAL DATA:  History of carotid stenosis  EXAM: BILATERAL CAROTID DUPLEX ULTRASOUND  TECHNIQUE: Pearline Cables scale imaging, color Doppler and duplex ultrasound were performed of bilateral carotid and vertebral arteries in the neck.  COMPARISON:  06/08/2014  FINDINGS: Criteria: Quantification of carotid stenosis is based on velocity parameters that correlate the residual internal carotid diameter with NASCET-based stenosis levels, using the diameter of the distal internal carotid lumen as the denominator for stenosis measurement.  The following velocity measurements were obtained:  RIGHT  ICA:  106 cm/sec  CCA:  93 cm/sec  SYSTOLIC ICA/CCA RATIO:  1.2  DIASTOLIC ICA/CCA RATIO:  2.6  ECA:  134 cm/sec  LEFT  ICA:  315 cm/sec  CCA:  737 cm/sec  SYSTOLIC ICA/CCA RATIO:  3.7  DIASTOLIC ICA/CCA RATIO:  5.3  ECA:  127 cm/sec  RIGHT CAROTID ARTERY: Mild calcified plaque in the upper  common carotid. Moderate predominately soft plaque in the bulb. There is significant calcified plaque in the proximal external carotid artery. Low resistance internal carotid Doppler pattern is preserved. There is calcified plaque in the lower internal carotid artery.  RIGHT VERTEBRAL ARTERY:  Antegrade.  LEFT CAROTID ARTERY: There is moderate predominately soft plaque in the bulb. Velocities are markedly elevated. There is extensive calcified plaque in the lower internal carotid artery. Low resistance internal carotid Doppler pattern is preserved.  LEFT VERTEBRAL ARTERY:  Antegrade.  IMPRESSION: Less than 50% stenosis in the right internal carotid artery. There is plaque in the right common, internal, and external carotid artery as described above.  Peak systolic velocity in the left internal carotid artery bulb has increased since the prior study and now indicates greater than 70% stenosis. Again, there is plaque within the left common carotid artery, bulb, and internal carotid artery.   Electronically Signed   By: Marybelle Killings M.D.   On: 07/20/2016 07:46   Result Notes   Notes recorded by Ewing, Robin B, CMA on 07/27/2016 at 9:30 AM EDT Yes ------  Notes recorded by Ann Held, DO on 07/26/2016 at 7:35 PM EDT There is no one in our building---- They are the only office in [place] -- check with [name] or Drue Dun ------  Notes recorded by Ewing, Donell Sievert, CMA on 07/26/2016 at 3:44 PM EDT Spoke to the patient informed of results/instructions. He is ok going back to vascular, but previously went to DR. Kellie Simmering on [facility]. -- he stated he does not want to go that far into [place]--would like our building or something a little closer ------  Notes recorded by Doylene Canning, CMA on 07/26/2016 at 12:02 PM EDT Left message on machine to call back  ------  Notes recorded by Damita Dunnings, [place] on 07/25/2016 at 2:33 PM EDT Forwarding to  vascular. ------  Notes  recorded by Ann Held, DO on 07/22/2016 at 11:25 AM EDT Please forward to Vascular surgery Stenosis has worsened-- he will need f/u with vascular surgeon      Vitals   Height Weight BMI (Calculated)      External Result Report   External Result Report  Imaging   Imaging Information  Signed by   Signed Date/Time  Phone Pager  Marybelle Killings 07/20/2016 7:46 AM [phone]  Result Notes   Notes recorded by Ewing, Robin B, CMA on 07/27/2016 at 9:30 AM EDT Yes ------  Notes recorded by Ann Held, DO on 07/26/2016 at 7:35 PM EDT There is no one in our building---- They are the only office in [place] -- check with [name] or Drue Dun ------  Notes recorded by Ewing, Donell Sievert, CMA on 07/26/2016 at 3:44 PM EDT Spoke to the patient informed of results/instructions. He is ok going back to vascular, but previously went to DR. Kellie Simmering on [facility]. -- he stated he does not want to go that far into [place]--would like our building or something a little closer ------  Notes recorded by Doylene Canning, CMA on 07/26/2016 at 12:02 PM EDT Left message on machine to call back  ------  Notes recorded by Damita Dunnings, [place] on 07/25/2016 at 2:33 PM EDT Forwarding to vascular. ------  Notes recorded by Ann Held, DO on 07/22/2016 at 11:25 AM EDT Please forward to Vascular surgery Stenosis has worsened-- he will need f/u with vascular surgeon      Signed   Electronically signed by Marybelle Killings, MD on 07/20/16 at 0746 EDT  Study Notes     Tesh, Sharon B, RT on 07/19/2016 6:02 PM  F/U bilateral carotid plaque    Original Order   Ordered On Ordered By   07/11/2016 4:31 PM Ann Held, DO        Pt was upset by physician at [facility]-- he waited over 2 hours and then was told the dr was not coming ---- then received $300 bill and he never saw the physician       Relevant Orders   Ambulatory referral to  Vascular Surgery   Essential hypertension - Primary    Well controlled, no changes to meds. Encouraged heart healthy diet such as the DASH diet and exercise as tolerated.       Relevant Orders   Lipid panel   Comprehensive metabolic panel   Hyperlipidemia    Tolerating statin, encouraged heart healthy diet, avoid trans fats, minimize simple carbs and saturated fats. Increase exercise as tolerated       Other Visit Diagnoses    Hyperlipidemia LDL goal <100       Relevant Orders   Lipid panel   Comprehensive metabolic panel      I am having Mr. Eslick maintain his Calcium-Vitamin D (CALTRATE 600 PLUS-VIT D PO), MULTIPLE VITAMIN PO, Saw Palmetto, (VITAMIN D, CHOLECALCIFEROL, PO), cyanocobalamin, denosumab, Cranberry-Cholecalciferol, clopidogrel, and atorvastatin.  No orders of the defined types were placed in this encounter.   CMA served as Education administrator during this visit. History, Physical and Plan performed by medical provider. Documentation and orders reviewed and attested to.  Ann Held, DO

## 2016-09-18 NOTE — Assessment & Plan Note (Signed)
Well controlled, no changes to meds. Encouraged heart healthy diet such as the DASH diet and exercise as tolerated.  °

## 2016-09-18 NOTE — Patient Instructions (Signed)

## 2016-09-19 LAB — LIPID PANEL
Cholesterol: 137 mg/dL (ref 0–200)
HDL: 49.5 mg/dL (ref >39.00)
LDL Cholesterol: 70 mg/dL (ref 0–99)
NonHDL: 87.43
TRIGLYCERIDES: 89 mg/dL (ref 0.0–149.0)
Total CHOL/HDL Ratio: 3
VLDL: 17.8 mg/dL (ref 0.0–40.0)

## 2016-09-19 LAB — COMPREHENSIVE METABOLIC PANEL
ALBUMIN: 3.8 g/dL (ref 3.5–5.2)
ALK PHOS: 75 U/L (ref 39–117)
ALT: 10 U/L (ref 0–53)
AST: 16 U/L (ref 0–37)
BILIRUBIN TOTAL: 0.3 mg/dL (ref 0.2–1.2)
BUN: 12 mg/dL (ref 6–23)
CALCIUM: 9.6 mg/dL (ref 8.4–10.5)
CO2: 30 meq/L (ref 19–32)
CREATININE: 0.83 mg/dL (ref 0.40–1.50)
Chloride: 103 mEq/L (ref 96–112)
GFR: 94.41 mL/min (ref >60.00)
Glucose, Bld: 137 mg/dL — ABNORMAL HIGH (ref 70–99)
Potassium: 4 mEq/L (ref 3.5–5.1)
Sodium: 139 mEq/L (ref 135–145)
Total Protein: 6.4 g/dL (ref 6.0–8.3)

## 2016-09-21 ENCOUNTER — Other Ambulatory Visit: Payer: Self-pay | Admitting: Family Medicine

## 2016-09-21 DIAGNOSIS — R739 Hyperglycemia, unspecified: Secondary | ICD-10-CM

## 2016-09-25 ENCOUNTER — Other Ambulatory Visit (INDEPENDENT_AMBULATORY_CARE_PROVIDER_SITE_OTHER): Payer: Medicare Other

## 2016-09-25 DIAGNOSIS — R739 Hyperglycemia, unspecified: Secondary | ICD-10-CM | POA: Diagnosis not present

## 2016-09-25 LAB — BASIC METABOLIC PANEL
BUN: 15 mg/dL (ref 6–23)
CALCIUM: 9.6 mg/dL (ref 8.4–10.5)
CO2: 29 mEq/L (ref 19–32)
Chloride: 105 mEq/L (ref 96–112)
Creatinine, Ser: 0.84 mg/dL (ref 0.40–1.50)
GFR: 93.11 mL/min (ref >60.00)
Glucose, Bld: 88 mg/dL (ref 70–99)
POTASSIUM: 3.9 meq/L (ref 3.5–5.1)
SODIUM: 141 meq/L (ref 135–145)

## 2016-09-25 LAB — HEMOGLOBIN A1C: HEMOGLOBIN A1C: 5.6 % (ref 4.6–6.5)

## 2016-10-13 ENCOUNTER — Ambulatory Visit (INDEPENDENT_AMBULATORY_CARE_PROVIDER_SITE_OTHER): Payer: Medicare Other | Admitting: Behavioral Health

## 2016-10-13 ENCOUNTER — Ambulatory Visit: Payer: Medicare Other

## 2016-10-13 DIAGNOSIS — M810 Age-related osteoporosis without current pathological fracture: Secondary | ICD-10-CM

## 2016-10-13 MED ORDER — DENOSUMAB 60 MG/ML ~~LOC~~ SOLN
60.0000 mg | Freq: Once | SUBCUTANEOUS | Status: AC
  Administered 2016-10-13: 60 mg via SUBCUTANEOUS

## 2016-10-13 NOTE — Progress Notes (Signed)
Pre visit review using our clinic review tool, if applicable. No additional management support is needed unless otherwise documented below in the visit note.  Patient came in clinic for Prolia injection. SQ injection was given in the right arm. Patient tolerated it well. No signs or symptoms of a reaction before leaving the nurse visit.

## 2016-10-18 ENCOUNTER — Ambulatory Visit (INDEPENDENT_AMBULATORY_CARE_PROVIDER_SITE_OTHER): Payer: Medicare Other | Admitting: Medical

## 2016-10-18 ENCOUNTER — Encounter: Payer: Self-pay | Admitting: Medical

## 2016-10-18 VITALS — BP 115/60 | HR 69 | Temp 97.7°F | Resp 17 | Ht 74.0 in | Wt 124.2 lb

## 2016-10-18 DIAGNOSIS — K219 Gastro-esophageal reflux disease without esophagitis: Secondary | ICD-10-CM

## 2016-10-18 DIAGNOSIS — R142 Eructation: Secondary | ICD-10-CM

## 2016-10-18 DIAGNOSIS — K117 Disturbances of salivary secretion: Secondary | ICD-10-CM | POA: Diagnosis not present

## 2016-10-18 DIAGNOSIS — I6523 Occlusion and stenosis of bilateral carotid arteries: Secondary | ICD-10-CM | POA: Diagnosis not present

## 2016-10-18 MED ORDER — RANITIDINE HCL 150 MG PO CAPS: 150.0000 mg | ORAL_CAPSULE | Freq: Two times a day (BID) | ORAL | 0 refills | Status: DC

## 2016-10-18 NOTE — Progress Notes (Signed)
Subjective:    Patient ID: Rick [name], male    DOB: Jul 07, 1933, 83 y.o.   MRN: [id number]  HPI  Pt in with some report of excess salivation. States has been gong on for one week. No pnd. States some sneezing occasional.  Pt has no sores in mouth. He had recent dental work. 4 crown place. Pt denies any heat burn. No soreness on gum area. He states sneezes occasionally.  No obvious runny nose or nasal congestion.  On review no abdomen pain. No constipation.  Pt has excess salivation. Some burping. No sour taste in mouth. He did report that taking tones over-the-counter CT help the last couple of days. Also he notes intermittent belching.  Symptoms for one week. Sometimes more so at night.   Pt states tums did help.  Review of Systems  HENT: Positive for sneezing. Negative for congestion, dental problem, ear discharge, postnasal drip, rhinorrhea and sinus pain.        Rare sneeze  Constant salivation.  Described so much salivation that he states is hard to swallow all of the saliva. But does not report having trouble swallowing solids.  Respiratory: Negative for choking, shortness of breath, wheezing and stridor.   Cardiovascular: Negative for chest pain and palpitations.  Gastrointestinal: Negative for abdominal pain and blood in stool.       Intermittent belching.  Endocrine: Negative for polydipsia, polyphagia and polyuria.  Genitourinary: Negative for discharge, dysuria and flank pain.  Musculoskeletal: Negative for back pain, gait problem, myalgias, neck pain and neck stiffness.  Skin: Negative for rash.  Hematological: Negative for adenopathy.  Psychiatric/Behavioral: Negative for behavioral problems, dysphoric mood, hallucinations, sleep disturbance and suicidal ideas. The patient is not nervous/anxious.        Objective:   Physical Exam  General  Mental Status - Alert. General Appearance - Well groomed. Not in acute distress.  Skin Rashes- No  Rashes.  HEENT Head- Normal. Ear Auditory Canal - Left- Normal. Right - Normal.Tympanic Membrane- Left- Normal. Right- Normal. Eye Sclera/Conjunctiva- Left- Normal. Right- Normal. Nose & Sinuses Nasal Mucosa- Left-  Boggy and Congested. Right-  Boggy and  Congested.Bilateral No maxillary and  no frontal sinus pressure. Mouth & Throat Lips: Upper Lip- Normal: no dryness, cracking, pallor, cyanosis, or vesicular eruption. Lower Lip-Normal: no dryness, cracking, pallor, cyanosis or vesicular eruption. Buccal Mucosa- Bilateral- No Aphthous ulcers.(On inspection of the buccal mucosa no abnormality seen on either side. On palpation of the parotid regions no abnormality or swelling either.) Oropharynx- No Discharge or Erythema. Mild postnasal drainage seen. Tonsils: Characteristics- Bilateral- No Erythema or Congestion. Size/Enlargement- Bilateral- No enlargement. Discharge- bilateral-None.  Neck Neck- Supple. No Masses. No lymphadenopathy   Chest and Lung Exam Auscultation: Breath Sounds:-Clear even and unlabored.  Cardiovascular Auscultation:Rythm- Regular, rate and rhythm. Murmurs & Other Heart Sounds:Ausculatation of the heart reveal- No Murmurs.  Lymphatic Head & Neck General Head & Neck Lymphatics: Bilateral: Description- No Localized lymphadenopathy.  Abdomen Inspection:-Inspection Normal.  Palpation/Perucssion: Palpation and Percussion of the abdomen reveal- Non Tender, No Rebound tenderness, No rigidity(Guarding) and No Palpable abdominal masses.  Liver:-Normal.  Spleen:- Normal.         Assessment & Plan:  For recent belching and excess salivation will rx ranitidine.(you report tums helped so cause may be associated with reflux).  I am considering giving antihistamine tablet in near future if symptoms of excess salivation persist.  Recent labs done so holding off on lab work. But may need in near future. Also  if you don't respond to meds I rx'd will consider  specialist referral. I will send copy of this note to your MD.  Follow up date 10 days or as needed. Please update [place] in one week.  Note did consider causes today such as cavities, infection ,gastroesophageal reflux and meds. Did not see medication cough review. Also not sure whether his for recent crowns and potential malocclusion complaint a role as timing of the hypersalivation did occur around the time of him getting for crowns.

## 2016-10-18 NOTE — Patient Instructions (Addendum)
For recent belching and excess salivation will rx ranitidine.(you report tums helped so cause may be  associated with reflux).  I am considering giving antihistamine tablet in near future if symptoms of excess salivation persist.  Recent labs done so holding off on lab work. But may need in near future. Also if you don't respond to meds I rx'd will consider specialist referral. I will send copy of this note to your MD.  Follow up date 10 days or as needed. Please update [place] in one week.

## 2016-11-01 ENCOUNTER — Encounter: Payer: Self-pay | Admitting: Medical

## 2016-11-01 ENCOUNTER — Telehealth: Payer: Self-pay | Admitting: Medical

## 2016-11-01 ENCOUNTER — Ambulatory Visit (INDEPENDENT_AMBULATORY_CARE_PROVIDER_SITE_OTHER): Payer: Medicare Other | Admitting: Medical

## 2016-11-01 VITALS — BP 106/53 | HR 63 | Temp 98.2°F | Resp 16 | Ht 74.0 in | Wt 122.2 lb

## 2016-11-01 DIAGNOSIS — K117 Disturbances of salivary secretion: Secondary | ICD-10-CM | POA: Diagnosis not present

## 2016-11-01 DIAGNOSIS — I6523 Occlusion and stenosis of bilateral carotid arteries: Secondary | ICD-10-CM | POA: Diagnosis not present

## 2016-11-01 MED ORDER — AMITRIPTYLINE HCL 25 MG PO TABS: 25.0000 mg | ORAL_TABLET | Freq: Every day | ORAL | 0 refills | Status: DC

## 2016-11-01 NOTE — Patient Instructions (Addendum)
For your severe excess salivation, I am prescribing low-dose amitriptyline. This may help. I will send Dr. Etter Sjogren a copy of your note and see if she wants to refer you to a specialist.   Please give [place] an update by Friday if this has helped.   Discussed med options with anticholinergic med options with [facility] Pharmacist today. Decided on amitriptyline. Want to give pt some relief as he appeared frustrated.  Follow up date would depend on response to amitriptyline and if we send you to specialist.

## 2016-11-01 NOTE — Telephone Encounter (Signed)
Dr Etter Sjogren,  I forwarded a note to you today on Mr. Acree. If you would review the 10/18/2016 and today's note. I'm not sure why he has excess salivation. I put him on low-dose amitriptyline(anti-cholinergic med) as effort to give him some potential relief but not sure this is a good plan long-term. So I was wondering if you are in agreement with referral to ears nose and throat for opinion.  Thanks, Percell Miller

## 2016-11-01 NOTE — Progress Notes (Addendum)
Subjective:    Patient ID: Rick [name], male    DOB: Jan 20, 1934, 83 y.o.   MRN: [id number]  HPI   Patient still has excess salivation(symptoms now persisting for about 3 weeks). He states that it is so severe that he has difficulty sleeping. He states he has to get up and spit out the saliva. He did not respond at all to the ranitidine. At that time when I wrote this medicine he was belching a lot. The belching has subsided but he states that it did not help with this elevation at all. Today he minimizes that he ever had belching of any significance.   No abdomen pain. No sneezing. No itching eyes. Pt denies any cough. No malocclusion. No postnasal drainage. Patient did have for crowns placed around the time that his salivation got excessive. However he does not associate that with excess salivation. In addition he does not feel like there is any significant difference in his occlusion.  He does not have any jaw pain and reported no abnormalities around his parotid glands or discomfort in his buccal mucosa  Patient expresses frustration with his symptoms.  No sinus pain reported. And no sore throat reported. Denies any obvious reflux symptoms.     Review of Systems  Constitutional: Negative for chills, fatigue and fever.  HENT: Negative for congestion, drooling, ear pain, nosebleeds, rhinorrhea, sinus pain, sinus pressure, sneezing, sore throat and voice change.        Excess salivation  Respiratory: Negative for cough, chest tightness, shortness of breath and wheezing.   Cardiovascular: Negative for chest pain and palpitations.  Gastrointestinal: Negative for abdominal pain.  Musculoskeletal: Negative for back pain, gait problem and myalgias.  Skin: Negative for rash.  Neurological: Negative for dizziness, syncope, speech difficulty, weakness, light-headedness and headaches.  Hematological: Does not bruise/bleed easily.  Psychiatric/Behavioral: Negative for agitation,  behavioral problems and decreased concentration. The patient is not nervous/anxious.        Objective:   Physical Exam  General  Mental Status - Alert. General Appearance - Well groomed. Not in acute distress.  Skin Rashes- No Rashes.  HEENT Head- Normal. Ear Auditory Canal - Left- Normal. Right - Normal.Tympanic Membrane- Left- Normal. Right- Normal. Eye Sclera/Conjunctiva- Left- Normal. Right- Normal. Nose & Sinuses Nasal Mucosa- Left- Not Boggy and Congested. Right- Not Boggy and  Congested.Bilateral no  maxillary and no  frontal sinus pressure. Mouth & Throat Lips: Upper Lip- Normal: no dryness, cracking, pallor, cyanosis, or vesicular eruption. Lower Lip-Normal: no dryness, cracking, pallor, cyanosis or vesicular eruption. Buccal Mucosa- Bilateral- No Aphthous ulcers. Oropharynx- No Discharge or Erythema. No abnormality of the buccal mucosa Tonsils: Characteristics- Bilateral- No Erythema or Congestion. Size/Enlargement- Bilateral- No enlargement. Discharge- bilateral-None.  Neck Neck- Supple. No Masses.   Chest and Lung Exam Auscultation: Breath Sounds:-Clear even and unlabored.  Cardiovascular Auscultation:Rythm- Regular, rate and rhythm. Murmurs & Other Heart Sounds:Ausculatation of the heart reveal- No Murmurs.  Lymphatic Head & Neck General Head & Neck Lymphatics: Bilateral: Description- No Localized lymphadenopathy.  Abdomen-patient declined exam today       Assessment & Plan:  For your severe excess salivation, I am prescribing low-dose amitriptyline. This may help. I will send Dr. Etter Sjogren a copy of your note and see if she wants to refer you to a specialist.   Please give [place] an update by Friday if this has helped.   Discussed med options with anticholinergic med options with [facility] Pharmacist today. Decided on amitriptyline. Want to  give pt some relief as he appeared frustrated.

## 2016-11-02 NOTE — Telephone Encounter (Signed)
That is fine with me.

## 2016-11-02 NOTE — Telephone Encounter (Signed)
Will you call pt on Monday and see if he is still experiencing excess salivation. Also with elavil med I gave him the other day does he have any noted side effects? Dr. Etter Sjogren is in agreement with referral if he is not better.   Also need to know about side effects of med. Might need to reduce dose. If it is helping will need to run it by Dr. Etter Sjogren to see if she wants him on elavil long term.  If side effects too much from med will go ahead and refer to ENT as well.

## 2016-11-07 ENCOUNTER — Ambulatory Visit (INDEPENDENT_AMBULATORY_CARE_PROVIDER_SITE_OTHER): Payer: Medicare Other | Admitting: Family Medicine

## 2016-11-07 ENCOUNTER — Encounter: Payer: Self-pay | Admitting: Family Medicine

## 2016-11-07 VITALS — BP 118/62 | HR 74 | Temp 97.9°F | Ht 74.0 in | Wt 122.2 lb

## 2016-11-07 DIAGNOSIS — K117 Disturbances of salivary secretion: Secondary | ICD-10-CM | POA: Diagnosis not present

## 2016-11-07 DIAGNOSIS — R05 Cough: Secondary | ICD-10-CM

## 2016-11-07 DIAGNOSIS — I6523 Occlusion and stenosis of bilateral carotid arteries: Secondary | ICD-10-CM | POA: Diagnosis not present

## 2016-11-07 DIAGNOSIS — R059 Cough, unspecified: Secondary | ICD-10-CM

## 2016-11-07 NOTE — Patient Instructions (Signed)
Get miralax and benefiber at the drug store to use for constipation  Stop the amitriptyline and the zantac as we discussed We will refer you to a specialist to help [place] figure out what the cause is

## 2016-11-07 NOTE — Progress Notes (Signed)
Patient ID: Rick [name], male    DOB: [birth year]  Age: 83 y.o. MRN: [id number]    Subjective:  Subjective  HPI Rick [name] presents for f/u increased salivating -- he originally saw PA for burping and salivation and coughing up phlegm.  He was given zantac and that has helped the burping but the salivating was a problems.  At the next visit he started amitriptyline and that helped some but pt has been very constipated and still salivating a lot at night -- pt has to keep a cup close by to spit in all the time.   Pt states he really was not burping -- he was coughing up saliva -- but the zantac did help.    Review of Systems  Constitutional: Negative for appetite change, diaphoresis, fatigue and unexpected weight change.  Eyes: Negative for pain, redness and visual disturbance.  Respiratory: Negative for cough, chest tightness, shortness of breath and wheezing.   Cardiovascular: Negative for chest pain, palpitations and leg swelling.  Endocrine: Negative for cold intolerance, heat intolerance, polydipsia, polyphagia and polyuria.  Genitourinary: Negative for difficulty urinating, dysuria and frequency.  Neurological: Negative for dizziness, light-headedness, numbness and headaches.    History Past Medical History:  Diagnosis Date  . BPH (benign prostatic hyperplasia)   . Carotid artery occlusion   . Chronic ear infection    left  . Hx of colonic polyps   . Hyperlipidemia   . Hypertension   . Kidney stones   . Osteoporosis     He has a past surgical history that includes Tonsillectomy.   His family history includes Seizures in his father; Stroke in his mother.He reports that he has been smoking Cigarettes.  He has a 25.00 pack-year smoking history. He has never used smokeless tobacco. He reports that he does not drink alcohol or use drugs.  Current Outpatient Prescriptions on File Prior to Visit  Medication Sig Dispense Refill  . atorvastatin (LIPITOR) 20 MG tablet TAKE 1  TABLET BY MOUTH EVERY DAY 90 tablet 0  . Calcium-Vitamin D (CALTRATE 600 PLUS-VIT D PO) Take 600 mg by mouth 2 (two) times daily.     . clopidogrel (PLAVIX) 75 MG tablet TAKE 1 TABLET BY MOUTH EVERY DAY 90 tablet 3  . Cranberry-Cholecalciferol 4200-500 MG-UNIT CAPS Take 1 capsule by mouth daily.    . cyanocobalamin 100 MCG tablet Take 100 mcg by mouth daily.    Marland Kitchen denosumab (PROLIA) 60 MG/ML SOLN injection Inject 60 mg into the skin every 6 (six) months. Administer in upper arm, thigh, or abdomen    . MULTIPLE VITAMIN PO Take by mouth.      . ranitidine (ZANTAC) 150 MG capsule Take 1 capsule (150 mg total) by mouth 2 (two) times daily. 60 capsule 0  . Saw Palmetto 450 MG CAPS Take by mouth.      Marland Kitchen VITAMIN D, CHOLECALCIFEROL, PO Take by mouth.       No current facility-administered medications on file prior to visit.      Objective:  Objective  Physical Exam  Nursing note and vitals reviewed.  BP 118/62 (BP Location: Right Arm, Patient Position: Sitting)   Pulse 74   Temp 97.9 F (36.6 C) (Oral)   Ht 6\' 2"  (1.88 m)   Wt 122 lb 3.2 oz (55.4 kg)   SpO2 97%   BMI 15.69 kg/m  Wt Readings from Last 3 Encounters:  11/07/16 122 lb 3.2 oz (55.4 kg)  11/01/16 122 lb 3.2 oz (  55.4 kg)  10/18/16 124 lb 3.2 oz (56.3 kg)     Lab Results  Component Value Date   WBC 7.8 03/21/2016   HGB 13.3 03/21/2016   HCT 39.1 03/21/2016   PLT 260.0 03/21/2016   GLUCOSE 88 09/25/2016   CHOL 137 09/18/2016   TRIG 89.0 09/18/2016   HDL 49.50 09/18/2016   LDLCALC 70 09/18/2016   ALT 10 09/18/2016   AST 16 09/18/2016   NA 141 09/25/2016   K 3.9 09/25/2016   CL 105 09/25/2016   CREATININE 0.84 09/25/2016   BUN 15 09/25/2016   CO2 29 09/25/2016   TSH 0.71 04/14/2010   PSA 5.00 (H) 03/06/2014   HGBA1C 5.6 09/25/2016    US Carotid Bilateral  Result Date: 07/20/2016 CLINICAL DATA:  History of carotid stenosis EXAM: BILATERAL CAROTID DUPLEX ULTRASOUND TECHNIQUE: Pearline Cables scale imaging, color Doppler  and duplex ultrasound were performed of bilateral carotid and vertebral arteries in the neck. COMPARISON:  06/08/2014 FINDINGS: Criteria: Quantification of carotid stenosis is based on velocity parameters that correlate the residual internal carotid diameter with NASCET-based stenosis levels, using the diameter of the distal internal carotid lumen as the denominator for stenosis measurement. The following velocity measurements were obtained: RIGHT ICA:  106 cm/sec CCA:  93 cm/sec SYSTOLIC ICA/CCA RATIO:  1.2 DIASTOLIC ICA/CCA RATIO:  2.6 ECA:  134 cm/sec LEFT ICA:  315 cm/sec CCA:  220 cm/sec SYSTOLIC ICA/CCA RATIO:  3.7 DIASTOLIC ICA/CCA RATIO:  5.3 ECA:  127 cm/sec RIGHT CAROTID ARTERY: Mild calcified plaque in the upper common carotid. Moderate predominately soft plaque in the bulb. There is significant calcified plaque in the proximal external carotid artery. Low resistance internal carotid Doppler pattern is preserved. There is calcified plaque in the lower internal carotid artery. RIGHT VERTEBRAL ARTERY:  Antegrade. LEFT CAROTID ARTERY: There is moderate predominately soft plaque in the bulb. Velocities are markedly elevated. There is extensive calcified plaque in the lower internal carotid artery. Low resistance internal carotid Doppler pattern is preserved. LEFT VERTEBRAL ARTERY:  Antegrade. IMPRESSION: Less than 50% stenosis in the right internal carotid artery. There is plaque in the right common, internal, and external carotid artery as described above. Peak systolic velocity in the left internal carotid artery bulb has increased since the prior study and now indicates greater than 70% stenosis. Again, there is plaque within the left common carotid artery, bulb, and internal carotid artery. Electronically Signed   By: Marybelle Killings M.D.   On: 07/20/2016 07:46     Assessment & Plan:  Plan  I have discontinued Rick [name] amitriptyline. I am also having him maintain his Calcium-Vitamin D (CALTRATE 600  PLUS-VIT D PO), MULTIPLE VITAMIN PO, Saw Palmetto, (VITAMIN D, CHOLECALCIFEROL, PO), cyanocobalamin, denosumab, Cranberry-Cholecalciferol, clopidogrel, atorvastatin, and ranitidine.  No orders of the defined types were placed in this encounter.   Problem List Items Addressed This Visit    None    Visit Diagnoses    Cough    -  Primary   Relevant Orders   Ambulatory referral to Gastroenterology   Hypersalivation       Relevant Orders   Ambulatory referral to Gastroenterology     zantac helped both but not enough per pt and elavil helped too but made him constipated.  He wanted to stop both and see a specialist---symptoms may be related to acid reflux --- doubt parkinson due to having no other symptoms Will refer to gi for eval Pt instructed to take miralax and benefiber for constipation  Follow-up: Return if symptoms worsen or fail to improve.  Ann Held, DO

## 2016-11-08 ENCOUNTER — Encounter: Payer: Self-pay | Admitting: Gastroenterology

## 2016-11-08 NOTE — Telephone Encounter (Signed)
Left pt a message to call back. 

## 2016-11-08 NOTE — Telephone Encounter (Signed)
Pt seen Dr Carollee Herter yesterday.

## 2016-11-14 ENCOUNTER — Other Ambulatory Visit: Payer: Self-pay | Admitting: Medical

## 2016-11-28 ENCOUNTER — Other Ambulatory Visit: Payer: Self-pay | Admitting: Medical

## 2016-11-30 ENCOUNTER — Encounter: Payer: Self-pay | Admitting: Gastroenterology

## 2016-11-30 ENCOUNTER — Ambulatory Visit (INDEPENDENT_AMBULATORY_CARE_PROVIDER_SITE_OTHER): Payer: Medicare Other | Admitting: Gastroenterology

## 2016-11-30 ENCOUNTER — Other Ambulatory Visit: Payer: Self-pay | Admitting: Family Medicine

## 2016-11-30 VITALS — BP 96/58 | HR 78 | Ht 74.0 in | Wt 121.0 lb

## 2016-11-30 DIAGNOSIS — Z87891 Personal history of nicotine dependence: Secondary | ICD-10-CM

## 2016-11-30 DIAGNOSIS — I6523 Occlusion and stenosis of bilateral carotid arteries: Secondary | ICD-10-CM | POA: Diagnosis not present

## 2016-11-30 DIAGNOSIS — K117 Disturbances of salivary secretion: Secondary | ICD-10-CM | POA: Diagnosis not present

## 2016-11-30 DIAGNOSIS — R499 Unspecified voice and resonance disorder: Secondary | ICD-10-CM | POA: Diagnosis not present

## 2016-11-30 MED ORDER — GLYCOPYRROLATE 1 MG PO TABS: 1.0000 mg | ORAL_TABLET | Freq: Three times a day (TID) | ORAL | 1 refills | Status: DC

## 2016-11-30 MED ORDER — OMEPRAZOLE 40 MG PO CPDR: 40.0000 mg | DELAYED_RELEASE_CAPSULE | Freq: Every day | ORAL | 1 refills | Status: DC

## 2016-11-30 NOTE — Patient Instructions (Addendum)
Please discontinue using Zantac and Elavil.  We have sent the following medications to your pharmacy for you to pick up at your convenience: Omeprazole Robinul  We are referring you to an [place] [facility] physician. You will be contacted regarding an appt.  If you have not heard from anyone within 2 weeks, please give [place] a call.  If you are age 81 or older, your body mass index should be between 23-30. Your Body mass index is 15.54 kg/m. If this is out of the aforementioned range listed, please consider follow up with your Primary Care Provider.  If you are age 73 or younger, your body mass index should be between 19-25. Your Body mass index is 15.54 kg/m. If this is out of the aformentioned range listed, please consider follow up with your Primary Care Provider.

## 2016-11-30 NOTE — Progress Notes (Signed)
HPI :  83 y/o male with a history of BPH, HTN, HLD, here to establish GI care, last seen by Dr. Velora Heckler in 2006, seen in consultation for Dr. Dierdre Searles for excess salivation.  Referred for excess in salivation. He thinks this issue has been ongoing for 4-5 weeks. He thinks it comes and goes, he uses a cup to spitting out his secretions into. When I ask him why he does this, he states "it's not healthy to swallow saliva". He feels he has a "phlegm" coming out into his mouth. He denies any dysphagia. He denies any reflux or pyrosis. No nausea or vomiting. No swelling in his face / neck. No pain in his mouth or salivary glands. He has been seen by dentist and had crown placed. He was given zantac 150mg  BID which provided questionable benefit, and and then Elavil 25mg  QHS which he thinks may have helped slighly.  He denies any use of new medications. He has a longstanding tobacco use history. He does endorse having some voice change and hoarseness since this began. He has never seen ENT or laryngoscopy. He has been on plavix but does not take it routinely for history of carotid artery stenosis.    Past Medical History:  Diagnosis Date  . BPH (benign prostatic hyperplasia)   . Carotid artery occlusion   . Chronic ear infection    left  . Hx of colonic polyps   . Hyperlipidemia   . Hypertension   . Kidney stones   . Osteoporosis      Past Surgical History:  Procedure Laterality Date  . TONSILLECTOMY     Family History  Problem Relation Age of Onset  . Stroke Mother   . Seizures Father   . Colon cancer Neg Hx    Social History  Substance Use Topics  . Smoking status: Light Tobacco Smoker    Packs/day: 0.50    Years: 50.00    Types: Cigarettes  . Smokeless tobacco: Never Used     Comment: smokes 6 cigarettes daily max  . Alcohol use No   Current Outpatient Prescriptions  Medication Sig Dispense Refill  . atorvastatin (LIPITOR) 20 MG tablet TAKE 1 TABLET BY MOUTH EVERY  DAY 90 tablet 0  . clopidogrel (PLAVIX) 75 MG tablet TAKE 1 TABLET BY MOUTH EVERY DAY 90 tablet 3  . denosumab (PROLIA) 60 MG/ML SOLN injection Inject 60 mg into the skin every 6 (six) months. Administer in upper arm, thigh, or abdomen    . Calcium-Vitamin D (CALTRATE 600 PLUS-VIT D PO) Take 600 mg by mouth 2 (two) times daily.     . Cranberry-Cholecalciferol 4200-500 MG-UNIT CAPS Take 1 capsule by mouth daily.    . cyanocobalamin 100 MCG tablet Take 100 mcg by mouth daily.    Marland Kitchen glycopyrrolate (ROBINUL) 1 MG tablet Take 1 tablet (1 mg total) by mouth 3 (three) times daily. 90 tablet 1  . MULTIPLE VITAMIN PO Take by mouth.      Marland Kitchen omeprazole (PRILOSEC) 40 MG capsule Take 1 capsule (40 mg total) by mouth daily. 30 capsule 1  . Saw Palmetto 450 MG CAPS Take by mouth.      Marland Kitchen VITAMIN D, CHOLECALCIFEROL, PO Take by mouth.       No current facility-administered medications for this visit.    Allergies  Allergen Reactions  . Aspirin Hives and Itching  . Penicillins Hives and Itching     Review of Systems: All systems reviewed and negative except  where noted in HPI.   Lab Results  Component Value Date   WBC 7.8 03/21/2016   HGB 13.3 03/21/2016   HCT 39.1 03/21/2016   MCV 95.4 03/21/2016   PLT 260.0 03/21/2016    Lab Results  Component Value Date   CREATININE 0.84 09/25/2016   BUN 15 09/25/2016   NA 141 09/25/2016   K 3.9 09/25/2016   CL 105 09/25/2016   CO2 29 09/25/2016    Lab Results  Component Value Date   ALT 10 09/18/2016   AST 16 09/18/2016   ALKPHOS 75 09/18/2016   BILITOT 0.3 09/18/2016     Physical Exam: BP (!) 96/58   Pulse 78   Ht 6\' 2"  (1.88 m)   Wt 121 lb (54.9 kg)   BMI 15.54 kg/m  Constitutional: Pleasant male in no acute distress. HEENT: Normocephalic and atraumatic. Conjunctivae are normal. No scleral icterus. Neck supple. No swollen glands appreciated Cardiovascular: Normal rate, regular rhythm.  Pulmonary/chest: Effort normal and breath sounds  normal. No wheezing, rales or rhonchi. Abdominal: Soft, nondistended, nontender. . There are no masses palpable. No hepatomegaly. Extremities: no edema Lymphadenopathy: No cervical adenopathy noted. Neurological: Alert and oriented to person place and time. Skin: Skin is warm and dry. No rashes noted. Psychiatric: Normal mood and affect. Behavior is normal.   ASSESSMENT AND PLAN: 83 year old male referred to [place] for a new patient evaluation for what appears to be sialorrhea and voice changes. He has a strong history tobacco use.   He denies any dysphagia or difficulty swallowing his secretions, yet elects to spit his secretions out. He denies any reflux symptoms and I don't think this is related to acid reflux. That being said we can empirically switch him from Zantac to omeprazole 40 mg once day for a few weeks to see if this helps at all. Otherwise recommend he try glycopyrrolate 1 mg 3 times a day which can help this condition, and he can stop Elavil. Given his tobacco use, voice changes, in that his symptoms seem less likely related to reflux or his ability to swallow, recommend referral to ENT to further evaluate, I think he warrants a laryngoscopy.   He agreed the plan, I'll await his course and consultation of ENT.   Throckmorton Cellar, MD [facility] Pager [phone]  CC: Carollee Herter, Alferd Apa, *

## 2016-12-01 ENCOUNTER — Telehealth: Payer: Self-pay

## 2016-12-01 NOTE — Telephone Encounter (Signed)
Faxed referral to [facility] ENT, Dr. Janace Hoard at [phone].  Office phone: [phone]. They will schedule with pt and notify us of appt once made.

## 2016-12-03 ENCOUNTER — Other Ambulatory Visit: Payer: Self-pay | Admitting: Family Medicine

## 2016-12-04 ENCOUNTER — Ambulatory Visit: Payer: Medicare Other | Admitting: Gastroenterology

## 2016-12-05 ENCOUNTER — Emergency Department (HOSPITAL_BASED_OUTPATIENT_CLINIC_OR_DEPARTMENT_OTHER): Payer: Medicare Other

## 2016-12-05 ENCOUNTER — Encounter (HOSPITAL_BASED_OUTPATIENT_CLINIC_OR_DEPARTMENT_OTHER): Payer: Self-pay | Admitting: *Deleted

## 2016-12-05 ENCOUNTER — Emergency Department (HOSPITAL_BASED_OUTPATIENT_CLINIC_OR_DEPARTMENT_OTHER)
Admission: EM | Admit: 2016-12-05 | Discharge: 2016-12-05 | Disposition: A | Discharge status: Home / Self Care | Payer: Medicare Other | Attending: Emergency Medicine | Admitting: Emergency Medicine

## 2016-12-05 DIAGNOSIS — F17210 Nicotine dependence, cigarettes, uncomplicated: Secondary | ICD-10-CM | POA: Insufficient documentation

## 2016-12-05 DIAGNOSIS — I10 Essential (primary) hypertension: Secondary | ICD-10-CM | POA: Diagnosis not present

## 2016-12-05 DIAGNOSIS — S20221A Contusion of right back wall of thorax, initial encounter: Secondary | ICD-10-CM | POA: Diagnosis not present

## 2016-12-05 DIAGNOSIS — Y9301 Activity, walking, marching and hiking: Secondary | ICD-10-CM | POA: Insufficient documentation

## 2016-12-05 DIAGNOSIS — S59901A Unspecified injury of right elbow, initial encounter: Secondary | ICD-10-CM | POA: Diagnosis not present

## 2016-12-05 DIAGNOSIS — S51011A Laceration without foreign body of right elbow, initial encounter: Secondary | ICD-10-CM | POA: Diagnosis not present

## 2016-12-05 DIAGNOSIS — Y92093 Driveway of other non-institutional residence as the place of occurrence of the external cause: Secondary | ICD-10-CM | POA: Insufficient documentation

## 2016-12-05 DIAGNOSIS — W010XXA Fall on same level from slipping, tripping and stumbling without subsequent striking against object, initial encounter: Secondary | ICD-10-CM | POA: Diagnosis not present

## 2016-12-05 DIAGNOSIS — R0781 Pleurodynia: Secondary | ICD-10-CM | POA: Diagnosis not present

## 2016-12-05 DIAGNOSIS — Z7902 Long term (current) use of antithrombotics/antiplatelets: Secondary | ICD-10-CM | POA: Insufficient documentation

## 2016-12-05 DIAGNOSIS — Z79899 Other long term (current) drug therapy: Secondary | ICD-10-CM | POA: Diagnosis not present

## 2016-12-05 DIAGNOSIS — S299XXA Unspecified injury of thorax, initial encounter: Secondary | ICD-10-CM | POA: Diagnosis not present

## 2016-12-05 DIAGNOSIS — M25521 Pain in right elbow: Secondary | ICD-10-CM | POA: Diagnosis not present

## 2016-12-05 DIAGNOSIS — S5001XA Contusion of right elbow, initial encounter: Secondary | ICD-10-CM | POA: Diagnosis not present

## 2016-12-05 DIAGNOSIS — W19XXXA Unspecified fall, initial encounter: Secondary | ICD-10-CM

## 2016-12-05 DIAGNOSIS — S20211A Contusion of right front wall of thorax, initial encounter: Secondary | ICD-10-CM | POA: Insufficient documentation

## 2016-12-05 DIAGNOSIS — Y998 Other external cause status: Secondary | ICD-10-CM | POA: Insufficient documentation

## 2016-12-05 NOTE — ED Triage Notes (Addendum)
He fell this am. Hematoma to his right forearm, bruising and abrasions. Pain to his right ribs. States he tripped and fell on his way to the park to walk.

## 2016-12-05 NOTE — Discharge Instructions (Signed)
X-ray of chest and ribs and right elbow without evidence of any bony injury, rib fracture or any underlying lung injury. Skin tear to right elbow will require daily dressing changes with antibiotic ointment. Follow up with your doctor. Return for any new or worse symptoms

## 2016-12-05 NOTE — ED Provider Notes (Signed)
[facility]PT [facility] Provider Note   CSN: [id number] Arrival date & time: 12/05/16  1220     History   Chief Complaint Chief Complaint  Patient presents with  . Fall    HPI Rick [name] is a 83 y.o. male.  Patient with a fall this morning around 11:00. Patient tripped no loss of consciousness. Landed on right side with abrasions skin tear to right elbow and some swelling to that area. Also with complaint of right posterior chest rib area pain. No abdominal pain no neck pain no lumbar back pain did not hit his head. No head injury. Patient is on Plavix. Patient fell outside on the driveway. Tetanus up-to-date.      Past Medical History:  Diagnosis Date  . BPH (benign prostatic hyperplasia)   . Carotid artery occlusion   . Chronic ear infection    left  . Hx of colonic polyps   . Hyperlipidemia   . Hypertension   . Kidney stones   . Osteoporosis     Patient Active Problem List   Diagnosis Date Noted  . Medicare annual wellness visit, subsequent 03/22/2016  . Carotid bruit 10/10/2010  . Preventative health care 10/10/2010  . RIB PAIN, RIGHT SIDED 10/11/2009  . UTI 09/28/2009  . TOBACCO USER 09/23/2008  . OSTEOPENIA 05/10/2008  . BACK PAIN, ACUTE 05/08/2008  . KNEE SPRAIN, LEFT, MEDIAL COLLATERAL LIGAMENT 01/02/2007  . TESTOSTERONE DEFICIENCY 10/02/2006  . Hyperlipidemia 08/01/2006  . ERECTILE DYSFUNCTION 08/01/2006  . BENIGN PROSTATIC HYPERTROPHY, HX OF 08/01/2006  . Essential hypertension 07/27/2006  . Carotid stenosis 07/27/2006  . MICROSCOPIC HEMATURIA 07/27/2006  . Osteoporosis 07/27/2006  . COLONIC POLYPS, HX OF 07/27/2006    Past Surgical History:  Procedure Laterality Date  . TONSILLECTOMY         Home Medications    Prior to Admission medications   Medication Sig Start Date End Date Taking? Authorizing Provider  atorvastatin (LIPITOR) 20 MG tablet TAKE 1 TABLET BY MOUTH EVERY DAY 12/04/16   Carollee Herter, Alferd Apa, DO  Calcium-Vitamin D  (CALTRATE 600 PLUS-VIT D PO) Take 600 mg by mouth 2 (two) times daily.     [provider]  clopidogrel (PLAVIX) 75 MG tablet TAKE 1 TABLET BY MOUTH EVERY DAY 12/01/16   Carollee Herter, Alferd Apa, DO  Cranberry-Cholecalciferol 4200-500 MG-UNIT CAPS Take 1 capsule by mouth daily.    [provider]  cyanocobalamin 100 MCG tablet Take 100 mcg by mouth daily.    [provider]  denosumab (PROLIA) 60 MG/ML SOLN injection Inject 60 mg into the skin every 6 (six) months. Administer in upper arm, thigh, or abdomen    [provider]  glycopyrrolate (ROBINUL) 1 MG tablet Take 1 tablet (1 mg total) by mouth 3 (three) times daily. 11/30/16   Armbruster, Carlota Raspberry, MD  MULTIPLE VITAMIN PO Take by mouth.      [provider]  omeprazole (PRILOSEC) 40 MG capsule Take 1 capsule (40 mg total) by mouth daily. 11/30/16   Armbruster, Carlota Raspberry, MD  Saw Palmetto 450 MG CAPS Take by mouth.      [provider]  VITAMIN D, CHOLECALCIFEROL, PO Take by mouth.      [provider]    Family History Family History  Problem Relation Age of Onset  . Stroke Mother   . Seizures Father   . Colon cancer Neg Hx     Social History Social History  Substance Use Topics  . Smoking  status: Light Tobacco Smoker    Packs/day: 0.50    Years: 50.00    Types: Cigarettes  . Smokeless tobacco: Never Used     Comment: smokes 6 cigarettes daily max  . Alcohol use No     Allergies   Aspirin and Penicillins   Review of Systems Review of Systems  Constitutional: Negative for fever.  HENT: Negative for congestion.   Eyes: Negative for redness.  Respiratory: Negative for shortness of breath.   Cardiovascular: Positive for chest pain.  Gastrointestinal: Negative for abdominal pain.  Genitourinary: Negative for dysuria and hematuria.  Musculoskeletal: Positive for back pain. Negative for neck pain.  Skin: Positive for wound.  Neurological: Negative for headaches.    Hematological: Does not bruise/bleed easily.  Psychiatric/Behavioral: Negative for confusion.     Physical Exam Updated Vital Signs BP (!) 132/59   Pulse 66   Temp 98.9 F (37.2 C) (Oral)   Resp 20   Ht 1.88 m (6\' 2" )   Wt 54.9 kg (121 lb)   SpO2 99%   BMI 15.54 kg/m   Physical Exam  Constitutional: He is oriented to person, place, and time. He appears well-developed and well-nourished. No distress.  HENT:  Head: Normocephalic and atraumatic.  Mouth/Throat: Oropharynx is clear and moist.  Eyes: Pupils are equal, round, and reactive to light. EOM are normal.  Neck: Normal range of motion. Neck supple.  Posterior neck nontender to palpation. Good range of motion.  Cardiovascular: Normal rate, regular rhythm and normal heart sounds.   Pulmonary/Chest: Effort normal and breath sounds normal. No respiratory distress. He exhibits tenderness.  Tenderness to palpation right posterior chest below the tip of the scapula. No evidence of any bruising crepitus or ecchymosis.  Abdominal: Soft. Bowel sounds are normal. There is no tenderness.  Musculoskeletal: He exhibits edema.  Extremities without any significant abnormalities. Radial pulse bilaterally 2+ with the exception of the right elbow area that has a 1 cm skin tear as well as some bruising around the elbow with some swelling. Proximal forearm over the ulnar bulbous hematoma soft nontender measuring about 1 x 3 cm. No obvious deformity.  Lower extremities nontender no hip tenderness good range of motion.  Neurological: He is alert and oriented to person, place, and time. No cranial nerve deficit or sensory deficit. He exhibits normal muscle tone. Coordination normal.  Skin: Skin is warm.  Vitals reviewed.    ED Treatments / Results  Labs (all labs ordered are listed, but only abnormal results are displayed) Labs Reviewed - No data to display  EKG  EKG Interpretation None       Radiology Dg Ribs Unilateral W/chest  Right  Result Date: 12/05/2016 CLINICAL DATA:  [place] and injured ribs today. EXAM: RIGHT RIBS AND CHEST - 3+ VIEW COMPARISON:  Chest x-ray and rib films 10/11/2009 FINDINGS: The cardiac silhouette, mediastinal and hilar contours are within normal limits and stable. There is mild tortuosity and calcification of the thoracic aorta. The lungs demonstrate stable emphysematous changes and pulmonary scarring. No definite acute pulmonary process. No pleural effusion or pneumothorax. Stable biapical pleural and parenchymal scarring changes. Dedicated rib films do not demonstrate any definite acute right rib fractures. IMPRESSION: No acute cardiopulmonary findings.  Chronic lung changes. No definite acute right-sided rib fractures. Electronically Signed   By: Marijo Sanes M.D.   On: 12/05/2016 13:27   Dg Elbow Complete Right  Result Date: 12/05/2016 CLINICAL DATA:  Fall.  Right elbow pain. EXAM: RIGHT ELBOW - COMPLETE  3+ VIEW COMPARISON:  None. FINDINGS: Slight obliquity of the lateral view limits assessment for joint effusion. No fracture, dislocation, definite joint effusion, suspicious focal osseous lesion or radiopaque foreign body. Diffuse osteopenia. Minimal osteoarthritis at the ulnohumeral compartment of the right elbow joint. Small enthesophytes at the lateral epicondyle. IMPRESSION: No fracture, dislocation or definite joint effusion in the right elbow. Diffuse osteopenia. Minimal osteoarthritis. Electronically Signed   By: Ilona Sorrel M.D.   On: 12/05/2016 13:29    Procedures Procedures (including critical care time)  Medications Ordered in ED Medications - No data to display   Initial Impression / Assessment and Plan / ED Course  I have reviewed the triage vital signs and the nursing notes.  Pertinent labs & imaging results that were available during my care of the patient were reviewed by me and considered in my medical decision making (see chart for details).     Patient with fall. Skin  tear to right elbow contusion and bruising to right elbow. Proximal forearm hematoma. Suspected tenderness along the right posterior back over the ribs is a rib contusion. Rib series negative chest x-ray negative. X-rays of right elbow without any bony injuries.  Wound care for the skin tear will be required.   Final Clinical Impressions(s) / ED Diagnoses   Final diagnoses:  Fall, initial encounter  Contusion of right elbow, initial encounter  Skin tear of elbow without complication, right, initial encounter  Rib contusion, right, initial encounter    New Prescriptions New Prescriptions   No medications on file     Fredia Sorrow, MD 12/05/16 1351

## 2016-12-12 DIAGNOSIS — K219 Gastro-esophageal reflux disease without esophagitis: Secondary | ICD-10-CM | POA: Diagnosis not present

## 2016-12-12 DIAGNOSIS — R1314 Dysphagia, pharyngoesophageal phase: Secondary | ICD-10-CM | POA: Diagnosis not present

## 2016-12-12 DIAGNOSIS — K117 Disturbances of salivary secretion: Secondary | ICD-10-CM | POA: Diagnosis not present

## 2016-12-13 NOTE — Telephone Encounter (Signed)
Almyra Free can you see how this patient is doing? I referred him for hypersalivation to ENT, did not think he had much reflux. He saw ENT, he declined laryngoscopy - they suggested a possible barium swallow to rule out Zenker's and thought he may having reflux.   Can you ask if the glycopyrollate has helped him? If his symptoms persist we can increase his omeprazole to twice daily dosing and recommend the barium swallow. Thanks

## 2016-12-13 NOTE — Telephone Encounter (Signed)
Spoke to patient, he is taking the glycopyrollate and is still salivating. Instructed to increase omeprazole to BID, patient states he has enough medication to increase dosage. He declined to schedule the barium swallow, he wants to try medication first.

## 2016-12-13 NOTE — Telephone Encounter (Signed)
Okay, he can call us back if symptoms persist. Thanks

## 2016-12-13 NOTE — Telephone Encounter (Signed)
Pt saw Dr. Janace Hoard at [facility]) on 12-12-16.  Office note is in [place].

## 2016-12-18 ENCOUNTER — Encounter (HOSPITAL_BASED_OUTPATIENT_CLINIC_OR_DEPARTMENT_OTHER): Payer: Self-pay | Admitting: *Deleted

## 2016-12-18 ENCOUNTER — Emergency Department (HOSPITAL_BASED_OUTPATIENT_CLINIC_OR_DEPARTMENT_OTHER)
Admission: EM | Admit: 2016-12-18 | Discharge: 2016-12-18 | Disposition: A | Discharge status: Home / Self Care | Payer: Medicare Other | Attending: Emergency Medicine | Admitting: Emergency Medicine

## 2016-12-18 DIAGNOSIS — I10 Essential (primary) hypertension: Secondary | ICD-10-CM | POA: Insufficient documentation

## 2016-12-18 DIAGNOSIS — Z7902 Long term (current) use of antithrombotics/antiplatelets: Secondary | ICD-10-CM | POA: Diagnosis not present

## 2016-12-18 DIAGNOSIS — Z79899 Other long term (current) drug therapy: Secondary | ICD-10-CM | POA: Insufficient documentation

## 2016-12-18 DIAGNOSIS — R339 Retention of urine, unspecified: Secondary | ICD-10-CM | POA: Insufficient documentation

## 2016-12-18 DIAGNOSIS — F17210 Nicotine dependence, cigarettes, uncomplicated: Secondary | ICD-10-CM | POA: Diagnosis not present

## 2016-12-18 LAB — URINALYSIS, MICROSCOPIC (REFLEX)

## 2016-12-18 LAB — CBC
HCT: 36.5 % — ABNORMAL LOW (ref 39.0–52.0)
Hemoglobin: 12.4 g/dL — ABNORMAL LOW (ref 13.0–17.0)
MCH: 31.6 pg (ref 26.0–34.0)
MCHC: 34 g/dL (ref 30.0–36.0)
MCV: 93.1 fL (ref 78.0–100.0)
PLATELETS: 297 10*3/uL (ref 150–400)
RBC: 3.92 MIL/uL — ABNORMAL LOW (ref 4.22–5.81)
RDW: 12.6 % (ref 11.5–15.5)
WBC: 7.4 10*3/uL (ref 4.0–10.5)

## 2016-12-18 LAB — URINALYSIS, ROUTINE W REFLEX MICROSCOPIC
BILIRUBIN URINE: NEGATIVE
Glucose, UA: NEGATIVE mg/dL
Ketones, ur: NEGATIVE mg/dL
Leukocytes, UA: NEGATIVE
Nitrite: NEGATIVE
PH: 7 (ref 5.0–8.0)
Protein, ur: NEGATIVE mg/dL
SPECIFIC GRAVITY, URINE: 1.01 (ref 1.005–1.030)

## 2016-12-18 LAB — BASIC METABOLIC PANEL
Anion gap: 7 (ref 5–15)
BUN: 12 mg/dL (ref 6–20)
CALCIUM: 8.8 mg/dL — AB (ref 8.9–10.3)
CO2: 26 mmol/L (ref 22–32)
Chloride: 98 mmol/L — ABNORMAL LOW (ref 101–111)
Creatinine, Ser: 0.71 mg/dL (ref 0.61–1.24)
GFR calc Af Amer: >60 mL/min (ref >60)
GLUCOSE: 102 mg/dL — AB (ref 65–99)
Potassium: 3.8 mmol/L (ref 3.5–5.1)
Sodium: 131 mmol/L — ABNORMAL LOW (ref 135–145)

## 2016-12-18 MED ORDER — LIDOCAINE HCL 2 % EX GEL
CUTANEOUS | Status: AC
  Filled 2016-12-18: qty 20

## 2016-12-18 MED ORDER — TAMSULOSIN HCL 0.4 MG PO CAPS: 0.4000 mg | ORAL_CAPSULE | Freq: Every day | ORAL | 0 refills | Status: DC

## 2016-12-18 NOTE — ED Triage Notes (Signed)
Unable to urinate. Last time he was able to urinate was last night. He drank 4 large cups of water in the past 2 hours and cannot urinate.

## 2016-12-18 NOTE — ED Notes (Signed)
Applied leg bag with sterile gloves and explained to Pt and Pts wife how to drain it, RN Shelda Pal informed

## 2016-12-18 NOTE — ED Notes (Signed)
Pt. Aware of Leg bag care with catheter and instructions of discharge.  Pt. Questions answered.

## 2016-12-18 NOTE — ED Notes (Signed)
800cc of urine shows on bladder scan.

## 2016-12-18 NOTE — ED Provider Notes (Signed)
[facility] EMERGENCY DEPARTMENT Provider Note   CSN: [id number] Arrival date & time: 12/18/16  1755     [name]   Chief Complaint Chief Complaint  Patient presents with  . Urinary Retention    Rick [name] EDIBERTO [name] is a 83 y.o. male.  Rick [name] Patient presents to the emergency room for evaluation of an inability to urinate.  Patient states he last urinated last evening.  All day today he was not able to urinate.  He felt like his bladder was becoming more distended.  He denies trouble with fever, vomiting or diarrhea.  Patient has started a new medication after seeing an ENT doctor recently.  He is not sure the name of that medication.  Patient denies any prior [name] of prostate problems or urinary issues. Past Rick [name]:  Diagnosis Date  . BPH (benign prostatic hyperplasia)   . Carotid artery occlusion   . Chronic ear infection    left  . Hx of colonic polyps   . Hyperlipidemia   . Hypertension   . Kidney stones   . Osteoporosis     Patient Active Problem List   Diagnosis Date Noted  . Medicare annual wellness visit, subsequent 03/22/2016  . Carotid bruit 10/10/2010  . Preventative health care 10/10/2010  . RIB PAIN, RIGHT SIDED 10/11/2009  . UTI 09/28/2009  . TOBACCO USER 09/23/2008  . OSTEOPENIA 05/10/2008  . BACK PAIN, ACUTE 05/08/2008  . KNEE SPRAIN, LEFT, MEDIAL COLLATERAL LIGAMENT 01/02/2007  . TESTOSTERONE DEFICIENCY 10/02/2006  . Hyperlipidemia 08/01/2006  . ERECTILE DYSFUNCTION 08/01/2006  . BENIGN PROSTATIC HYPERTROPHY, HX OF 08/01/2006  . Essential hypertension 07/27/2006  . Carotid stenosis 07/27/2006  . MICROSCOPIC HEMATURIA 07/27/2006  . Osteoporosis 07/27/2006  . COLONIC POLYPS, HX OF 07/27/2006    Past Surgical [name]:  Procedure Laterality Date  . TONSILLECTOMY         Home Medications    Prior to Admission medications   Medication Sig Start Date End Date Taking? Authorizing Provider  atorvastatin (LIPITOR) 20 MG  tablet TAKE 1 TABLET BY MOUTH EVERY DAY 12/04/16   Carollee Herter, Alferd Apa, DO  Calcium-Vitamin D (CALTRATE 600 PLUS-VIT D PO) Take 600 mg by mouth 2 (two) times daily.     [provider]  clopidogrel (PLAVIX) 75 MG tablet TAKE 1 TABLET BY MOUTH EVERY DAY 12/01/16   Carollee Herter, Alferd Apa, DO  Cranberry-Cholecalciferol 4200-500 MG-UNIT CAPS Take 1 capsule by mouth daily.    [provider]  cyanocobalamin 100 MCG tablet Take 100 mcg by mouth daily.    [provider]  denosumab (PROLIA) 60 MG/ML SOLN injection Inject 60 mg into the skin every 6 (six) months. Administer in upper arm, thigh, or abdomen    [provider]  glycopyrrolate (ROBINUL) 1 MG tablet Take 1 tablet (1 mg total) by mouth 3 (three) times daily. 11/30/16   Armbruster, Carlota Raspberry, MD  MULTIPLE VITAMIN PO Take by mouth.      [provider]  omeprazole (PRILOSEC) 40 MG capsule Take 1 capsule (40 mg total) by mouth daily. 11/30/16   Armbruster, Carlota Raspberry, MD  Saw Palmetto 450 MG CAPS Take by mouth.      [provider]  tamsulosin (FLOMAX) 0.4 MG CAPS capsule Take 1 capsule (0.4 mg total) by mouth daily. 12/18/16   Dorie Rank, MD  VITAMIN D, CHOLECALCIFEROL, PO Take by mouth.      [provider]    Family [name] Family [name]  Problem  Relation Age of Onset  . Stroke Mother   . Seizures Father   . Colon cancer Neg Hx     Social [name] Social [name]  Substance Use Topics  . Smoking status: Light Tobacco Smoker    Packs/day: 0.50    Years: 50.00    Types: Cigarettes  . Smokeless tobacco: Never Used     Comment: smokes 6 cigarettes daily max  . Alcohol use No     Allergies   Aspirin Rick Penicillins   Review of Systems Review of Systems  All other systems reviewed Rick are negative.    Rick [name] Updated Vital Signs BP (!) 174/77 (BP Location: Right Arm)   Pulse 60   Temp 98 F (36.7 C) (Oral)   Resp 16   Ht 1.88 m (6\' 2" )   Wt 54.9 kg (121  lb)   SpO2 100%   BMI 15.54 kg/m   Rick [name]  Constitutional: No distress.  Elderly  HENT:  Head: Normocephalic Rick atraumatic.  Right Ear: External ear normal.  Left Ear: External ear normal.  Eyes: Conjunctivae are normal. Right eye exhibits no discharge. Left eye exhibits no discharge. No scleral icterus.  Neck: Neck supple. No tracheal deviation present.  Cardiovascular: Normal rate, regular rhythm Rick intact distal pulses.   Pulmonary/Chest: Effort normal Rick breath sounds normal. No stridor. No respiratory distress. He has no wheezes. He has no rales.  Abdominal: Soft. Bowel sounds are normal. He exhibits no distension. There is no tenderness. There is no rebound Rick no guarding.  Genitourinary:  Genitourinary Comments: Foley catheter was placed prior to my evaluation, catheter has at least 500 cc of yellow urine, small amount of blood at the urethral meatus after the catheter insertion  Musculoskeletal: He exhibits no edema or tenderness.  Neurological: He is alert. He has normal strength. No cranial nerve deficit (no facial droop, extraocular movements intact, no slurred speech) or sensory deficit. He exhibits normal muscle tone. He displays no seizure activity. Coordination normal.  Skin: Skin is warm Rick dry. No rash noted.  Psychiatric: He has a normal mood Rick affect.  Nursing note Rick vitals reviewed.    ED Treatments / Results  Labs (all labs ordered are listed, but only abnormal results are displayed) Labs Reviewed  URINALYSIS, ROUTINE W REFLEX MICROSCOPIC - Abnormal; Notable for the following:       Result Value   Hgb urine dipstick MODERATE (*)    All other components within normal limits  CBC - Abnormal; Notable for the following:    RBC 3.92 (*)    Hemoglobin 12.4 (*)    HCT 36.5 (*)    All other components within normal limits  BASIC METABOLIC PANEL - Abnormal; Notable for the following:    Sodium 131 (*)    Chloride 98 (*)    Glucose, Bld 102 (*)      Calcium 8.8 (*)    All other components within normal limits  URINALYSIS, MICROSCOPIC (REFLEX) - Abnormal; Notable for the following:    Bacteria, UA RARE (*)    Squamous Epithelial / LPF 0-5 (*)    All other components within normal limits     Procedures Procedures (including critical care time)  Medications Ordered in ED Medications  lidocaine (XYLOCAINE) 2 % jelly (not administered)     Initial Impression / Assessment Rick Plan / ED Course  I have reviewed the triage vital signs Rick the nursing notes.  Pertinent labs & imaging results that were  available during my care of the patient were reviewed by me Rick considered in my Rick decision making (see chart for details).    Patient presented to the emergency room with urinary retention.  Foley catheter was placed.  Patient did have a large volume of urine.  His symptoms improved.  Laboratory tests are otherwise reassuring.  No signs of acute infection or acute renal failure.  I will discharge the patient home with Foley catheter in place Rick outpatient urology follow-up.  I will give him a prescription for Flomax.  Final Clinical Impressions(s) / ED Diagnoses   Final diagnoses:  Urinary retention    New Prescriptions New Prescriptions   TAMSULOSIN (FLOMAX) 0.4 MG CAPS CAPSULE    Take 1 capsule (0.4 mg total) by mouth daily.     Dorie Rank, MD 12/18/16 2016

## 2016-12-21 ENCOUNTER — Other Ambulatory Visit: Payer: Self-pay

## 2016-12-21 ENCOUNTER — Telehealth: Payer: Self-pay | Admitting: Gastroenterology

## 2016-12-21 NOTE — Telephone Encounter (Signed)
Called and left a message, no answer 

## 2016-12-21 NOTE — Telephone Encounter (Signed)
Rick [name] can you please contact this patient. I spoke about his case with Dr. Janace Hoard of ENT. Unclear if the patient has siallorhea causing his symptoms versus regurgitation of esophageal contents. I had placed him on Robinul and Dr. Janace Hoard states it has dried out his mouth but hasn't stopped his symptoms. I also noted he was seen in the ER for urinary retention and possibly that was related. He should stop the Robinul if he doesn't think it is helping and perhaps related to some side effects.   Moving forward, he should try twice daily PPI for reflux (I think we had put him on once daily). I think barium swallow is a reasonable next step to ensure no Zenker's which could cause this.   Can you help coordinate a barium swallow if he is interested in proceeding? Thanks much

## 2016-12-22 ENCOUNTER — Other Ambulatory Visit: Payer: Self-pay

## 2016-12-22 DIAGNOSIS — R339 Retention of urine, unspecified: Secondary | ICD-10-CM | POA: Diagnosis not present

## 2016-12-22 MED ORDER — OMEPRAZOLE 40 MG PO CPDR: 40.0000 mg | DELAYED_RELEASE_CAPSULE | Freq: Two times a day (BID) | ORAL | 1 refills | Status: DC

## 2016-12-22 NOTE — Telephone Encounter (Signed)
Patient advised of results, not interested in scheduling barium swallow test. Sent Rx for dosage change of omeprazole to his [facility] and patient states he will go pick it up. Instructed to stop the Robinul.

## 2016-12-23 ENCOUNTER — Encounter (HOSPITAL_COMMUNITY): Payer: Self-pay | Admitting: Emergency Medicine

## 2016-12-23 ENCOUNTER — Emergency Department (HOSPITAL_COMMUNITY)
Admission: EM | Admit: 2016-12-23 | Discharge: 2016-12-23 | Disposition: A | Discharge status: Home / Self Care | Payer: Medicare Other | Attending: Emergency Medicine | Admitting: Emergency Medicine

## 2016-12-23 DIAGNOSIS — N390 Urinary tract infection, site not specified: Secondary | ICD-10-CM | POA: Diagnosis not present

## 2016-12-23 DIAGNOSIS — F17210 Nicotine dependence, cigarettes, uncomplicated: Secondary | ICD-10-CM | POA: Insufficient documentation

## 2016-12-23 DIAGNOSIS — Z79899 Other long term (current) drug therapy: Secondary | ICD-10-CM | POA: Insufficient documentation

## 2016-12-23 DIAGNOSIS — R319 Hematuria, unspecified: Secondary | ICD-10-CM | POA: Diagnosis present

## 2016-12-23 LAB — URINALYSIS, ROUTINE W REFLEX MICROSCOPIC

## 2016-12-23 LAB — BASIC METABOLIC PANEL
ANION GAP: 11 (ref 5–15)
BUN: 11 mg/dL (ref 6–20)
CO2: 28 mmol/L (ref 22–32)
Calcium: 9.5 mg/dL (ref 8.9–10.3)
Chloride: 98 mmol/L — ABNORMAL LOW (ref 101–111)
Creatinine, Ser: 0.78 mg/dL (ref 0.61–1.24)
GFR calc Af Amer: >60 mL/min (ref >60)
GFR calc non Af Amer: >60 mL/min (ref >60)
Glucose, Bld: 109 mg/dL — ABNORMAL HIGH (ref 65–99)
POTASSIUM: 4.6 mmol/L (ref 3.5–5.1)
SODIUM: 137 mmol/L (ref 135–145)

## 2016-12-23 LAB — CBC WITH DIFFERENTIAL/PLATELET
BASOS ABS: 0 10*3/uL (ref 0.0–0.1)
BASOS PCT: 0 %
EOS PCT: 1 %
Eosinophils Absolute: 0.1 10*3/uL (ref 0.0–0.7)
HCT: 39.8 % (ref 39.0–52.0)
Hemoglobin: 13.3 g/dL (ref 13.0–17.0)
LYMPHS PCT: 12 %
Lymphs Abs: 1.3 10*3/uL (ref 0.7–4.0)
MCH: 31.4 pg (ref 26.0–34.0)
MCHC: 33.4 g/dL (ref 30.0–36.0)
MCV: 94.1 fL (ref 78.0–100.0)
MONO ABS: 0.7 10*3/uL (ref 0.1–1.0)
MONOS PCT: 7 %
NEUTROS ABS: 8.2 10*3/uL — AB (ref 1.7–7.7)
Neutrophils Relative %: 80 %
PLATELETS: 327 10*3/uL (ref 150–400)
RBC: 4.23 MIL/uL (ref 4.22–5.81)
RDW: 13.3 % (ref 11.5–15.5)
WBC: 10.3 10*3/uL (ref 4.0–10.5)

## 2016-12-23 LAB — URINALYSIS, MICROSCOPIC (REFLEX): Squamous Epithelial / LPF: NONE SEEN

## 2016-12-23 MED ORDER — CIPROFLOXACIN HCL 500 MG PO TABS
500.0000 mg | ORAL_TABLET | Freq: Once | ORAL | Status: AC
  Administered 2016-12-23: 500 mg via ORAL
  Filled 2016-12-23: qty 1

## 2016-12-23 MED ORDER — CIPROFLOXACIN HCL 500 MG PO TABS: 500.0000 mg | ORAL_TABLET | Freq: Two times a day (BID) | ORAL | 0 refills | Status: DC

## 2016-12-23 NOTE — ED Notes (Signed)
Patient voided amber urine blood tinged-no clots

## 2016-12-23 NOTE — ED Provider Notes (Signed)
[facility] DEPT Provider Note   CSN: [id number] Arrival date & time: 12/23/16  1730     History   Chief Complaint Chief Complaint  Patient presents with  . Hematuria    HPI Rick [name] is a 83 y.o. male.  HPI Patient has noticed blood in his urine since last night.  He denies dysuria denies pain with urination he is asymptomatic.  Denies lightheadedness denies pain anywhere denies dysuria he had Foley catheter removed 4 or 5 days ago which she had for urinary tract infection.  No treatment prior to coming here no other associated symptoms Past Medical History:  Diagnosis Date  . BPH (benign prostatic hyperplasia)   . Carotid artery occlusion   . Chronic ear infection    left  . Hx of colonic polyps   . Hyperlipidemia   . Hypertension   . Kidney stones   . Osteoporosis     Patient Active Problem List   Diagnosis Date Noted  . Medicare annual wellness visit, subsequent 03/22/2016  . Carotid bruit 10/10/2010  . Preventative health care 10/10/2010  . RIB PAIN, RIGHT SIDED 10/11/2009  . UTI 09/28/2009  . TOBACCO USER 09/23/2008  . OSTEOPENIA 05/10/2008  . BACK PAIN, ACUTE 05/08/2008  . KNEE SPRAIN, LEFT, MEDIAL COLLATERAL LIGAMENT 01/02/2007  . TESTOSTERONE DEFICIENCY 10/02/2006  . Hyperlipidemia 08/01/2006  . ERECTILE DYSFUNCTION 08/01/2006  . BENIGN PROSTATIC HYPERTROPHY, HX OF 08/01/2006  . Essential hypertension 07/27/2006  . Carotid stenosis 07/27/2006  . MICROSCOPIC HEMATURIA 07/27/2006  . Osteoporosis 07/27/2006  . COLONIC POLYPS, HX OF 07/27/2006    Past Surgical History:  Procedure Laterality Date  . TONSILLECTOMY         Home Medications    Prior to Admission medications   Medication Sig Start Date End Date Taking? Authorizing Provider  atorvastatin (LIPITOR) 20 MG tablet TAKE 1 TABLET BY MOUTH EVERY DAY 12/04/16   Carollee Herter, Alferd Apa, DO  Calcium-Vitamin D (CALTRATE 600 PLUS-VIT D PO) Take 600 mg by mouth  2 (two) times daily.     [provider]  clopidogrel (PLAVIX) 75 MG tablet TAKE 1 TABLET BY MOUTH EVERY DAY 12/01/16   Carollee Herter, Alferd Apa, DO  Cranberry-Cholecalciferol 4200-500 MG-UNIT CAPS Take 1 capsule by mouth daily.    [provider]  cyanocobalamin 100 MCG tablet Take 100 mcg by mouth daily.    [provider]  denosumab (PROLIA) 60 MG/ML SOLN injection Inject 60 mg into the skin every 6 (six) months. Administer in upper arm, thigh, or abdomen    [provider]  MULTIPLE VITAMIN PO Take by mouth.      [provider]  omeprazole (PRILOSEC) 40 MG capsule Take 1 capsule (40 mg total) by mouth 2 (two) times daily. 12/22/16   Armbruster, Carlota Raspberry, MD  Saw Palmetto 450 MG CAPS Take by mouth.      [provider]  tamsulosin (FLOMAX) 0.4 MG CAPS capsule Take 1 capsule (0.4 mg total) by mouth daily. 12/18/16   Dorie Rank, MD  VITAMIN D, CHOLECALCIFEROL, PO Take by mouth.      [provider]    Family History Family History  Problem Relation Age of Onset  . Stroke Mother   . Seizures Father   . Colon cancer Neg Hx     Social History Social History  Substance Use Topics  . Smoking status: Light Tobacco Smoker    Packs/day: 0.50    Years: 50.00  Types: Cigarettes  . Smokeless tobacco: Never Used     Comment: smokes 6 cigarettes daily max  . Alcohol use No     Allergies   Aspirin and Penicillins   Review of Systems Review of Systems  Constitutional: Negative.   HENT: Negative.   Respiratory: Negative.   Cardiovascular: Positive for chest pain.       Syncope  Gastrointestinal: Negative.   Genitourinary: Positive for hematuria.  Musculoskeletal: Negative.   Skin: Negative.   Allergic/Immunologic: Negative.   Neurological: Negative.   Psychiatric/Behavioral: Negative.   All other systems reviewed and are negative.    Physical Exam Updated Vital Signs BP (!) 147/80 (BP Location: Left Arm)   Pulse  87   Temp (!) 97.4 F (36.3 C) (Oral)   Resp 16   SpO2 100%   Physical Exam  Constitutional: He appears well-developed and well-nourished.  HENT:  Head: Normocephalic and atraumatic.  Eyes: Pupils are equal, round, and reactive to light. Conjunctivae are normal.  Neck: Neck supple. No tracheal deviation present. No thyromegaly present.  Cardiovascular: Normal rate and regular rhythm.   No murmur heard. Pulmonary/Chest: Effort normal and breath sounds normal.  Abdominal: Soft. Bowel sounds are normal. He exhibits no distension. There is no tenderness.  Genitourinary: Penis normal.  Musculoskeletal: Normal range of motion. He exhibits no edema or tenderness.  Neurological: He is alert. Coordination normal.  Skin: Skin is warm and dry. No rash noted.  Psychiatric: He has a normal mood and affect.  Nursing note and vitals reviewed.    ED Treatments / Results  Labs (all labs ordered are listed, but only abnormal results are displayed) Labs Reviewed  URINALYSIS, ROUTINE W REFLEX MICROSCOPIC    EKG  EKG Interpretation None      Results for orders placed or performed during the hospital encounter of 12/23/16  Urinalysis, Routine w reflex microscopic- may I&O cath if menses  Result Value Ref Range   Color, Urine RED (A) YELLOW   APPearance TURBID (A) CLEAR   Specific Gravity, Urine  1.005 - 1.030    TEST NOT REPORTED DUE TO COLOR INTERFERENCE OF URINE PIGMENT   pH  5.0 - 8.0    TEST NOT REPORTED DUE TO COLOR INTERFERENCE OF URINE PIGMENT   Glucose, UA (A) NEGATIVE mg/dL    TEST NOT REPORTED DUE TO COLOR INTERFERENCE OF URINE PIGMENT   Hgb urine dipstick (A) NEGATIVE    TEST NOT REPORTED DUE TO COLOR INTERFERENCE OF URINE PIGMENT   Bilirubin Urine (A) NEGATIVE    TEST NOT REPORTED DUE TO COLOR INTERFERENCE OF URINE PIGMENT   Ketones, ur (A) NEGATIVE mg/dL    TEST NOT REPORTED DUE TO COLOR INTERFERENCE OF URINE PIGMENT   Protein, ur (A) NEGATIVE mg/dL    TEST NOT REPORTED  DUE TO COLOR INTERFERENCE OF URINE PIGMENT   Nitrite (A) NEGATIVE    TEST NOT REPORTED DUE TO COLOR INTERFERENCE OF URINE PIGMENT   Leukocytes, UA (A) NEGATIVE    TEST NOT REPORTED DUE TO COLOR INTERFERENCE OF URINE PIGMENT  Basic metabolic panel  Result Value Ref Range   Sodium 137 135 - 145 mmol/L   Potassium 4.6 3.5 - 5.1 mmol/L   Chloride 98 (L) 101 - 111 mmol/L   CO2 28 22 - 32 mmol/L   Glucose, Bld 109 (H) 65 - 99 mg/dL   BUN 11 6 - 20 mg/dL   Creatinine, Ser 0.78 0.61 - 1.24 mg/dL   Calcium 9.5 8.9 -  10.3 mg/dL   GFR calc non Af Amer >60 >60 mL/min   GFR calc Af Amer >60 >60 mL/min   Anion gap 11 5 - 15  CBC with Differential/Platelet  Result Value Ref Range   WBC 10.3 4.0 - 10.5 K/uL   RBC 4.23 4.22 - 5.81 MIL/uL   Hemoglobin 13.3 13.0 - 17.0 g/dL   HCT 39.8 39.0 - 52.0 %   MCV 94.1 78.0 - 100.0 fL   MCH 31.4 26.0 - 34.0 pg   MCHC 33.4 30.0 - 36.0 g/dL   RDW 13.3 11.5 - 15.5 %   Platelets 327 150 - 400 K/uL   Neutrophils Relative % 80 %   Neutro Abs 8.2 (H) 1.7 - 7.7 K/uL   Lymphocytes Relative 12 %   Lymphs Abs 1.3 0.7 - 4.0 K/uL   Monocytes Relative 7 %   Monocytes Absolute 0.7 0.1 - 1.0 K/uL   Eosinophils Relative 1 %   Eosinophils Absolute 0.1 0.0 - 0.7 K/uL   Basophils Relative 0 %   Basophils Absolute 0.0 0.0 - 0.1 K/uL  Urinalysis, Microscopic (reflex)  Result Value Ref Range   RBC / HPF TOO NUMEROUS TO COUNT 0 - 5 RBC/hpf   WBC, UA TOO NUMEROUS TO COUNT 0 - 5 WBC/hpf   Bacteria, UA FEW (A) NONE SEEN   Squamous Epithelial / LPF NONE SEEN NONE SEEN   WBC Clumps PRESENT    Dg Ribs Unilateral W/chest Right  Result Date: 12/05/2016 CLINICAL DATA:  [place] and injured ribs today. EXAM: RIGHT RIBS AND CHEST - 3+ VIEW COMPARISON:  Chest x-ray and rib films 10/11/2009 FINDINGS: The cardiac silhouette, mediastinal and hilar contours are within normal limits and stable. There is mild tortuosity and calcification of the thoracic aorta. The lungs demonstrate stable  emphysematous changes and pulmonary scarring. No definite acute pulmonary process. No pleural effusion or pneumothorax. Stable biapical pleural and parenchymal scarring changes. Dedicated rib films do not demonstrate any definite acute right rib fractures. IMPRESSION: No acute cardiopulmonary findings.  Chronic lung changes. No definite acute right-sided rib fractures. Electronically Signed   By: Marijo Sanes M.D.   On: 12/05/2016 13:27   Dg Elbow Complete Right  Result Date: 12/05/2016 CLINICAL DATA:  Fall.  Right elbow pain. EXAM: RIGHT ELBOW - COMPLETE 3+ VIEW COMPARISON:  None. FINDINGS: Slight obliquity of the lateral view limits assessment for joint effusion. No fracture, dislocation, definite joint effusion, suspicious focal osseous lesion or radiopaque foreign body. Diffuse osteopenia. Minimal osteoarthritis at the ulnohumeral compartment of the right elbow joint. Small enthesophytes at the lateral epicondyle. IMPRESSION: No fracture, dislocation or definite joint effusion in the right elbow. Diffuse osteopenia. Minimal osteoarthritis. Electronically Signed   By: Ilona Sorrel M.D.   On: 12/05/2016 13:29  Radiology No results found.  Procedures Procedures (including critical care time)  Medications Ordered in ED Medications - No data to display Results for orders placed or performed during the hospital encounter of 12/23/16  Urinalysis, Routine w reflex microscopic- may I&O cath if menses  Result Value Ref Range   Color, Urine RED (A) YELLOW   APPearance TURBID (A) CLEAR   Specific Gravity, Urine  1.005 - 1.030    TEST NOT REPORTED DUE TO COLOR INTERFERENCE OF URINE PIGMENT   pH  5.0 - 8.0    TEST NOT REPORTED DUE TO COLOR INTERFERENCE OF URINE PIGMENT   Glucose, UA (A) NEGATIVE mg/dL    TEST NOT REPORTED DUE TO COLOR INTERFERENCE OF URINE  PIGMENT   Hgb urine dipstick (A) NEGATIVE    TEST NOT REPORTED DUE TO COLOR INTERFERENCE OF URINE PIGMENT   Bilirubin Urine (A) NEGATIVE    TEST  NOT REPORTED DUE TO COLOR INTERFERENCE OF URINE PIGMENT   Ketones, ur (A) NEGATIVE mg/dL    TEST NOT REPORTED DUE TO COLOR INTERFERENCE OF URINE PIGMENT   Protein, ur (A) NEGATIVE mg/dL    TEST NOT REPORTED DUE TO COLOR INTERFERENCE OF URINE PIGMENT   Nitrite (A) NEGATIVE    TEST NOT REPORTED DUE TO COLOR INTERFERENCE OF URINE PIGMENT   Leukocytes, UA (A) NEGATIVE    TEST NOT REPORTED DUE TO COLOR INTERFERENCE OF URINE PIGMENT  Basic metabolic panel  Result Value Ref Range   Sodium 137 135 - 145 mmol/L   Potassium 4.6 3.5 - 5.1 mmol/L   Chloride 98 (L) 101 - 111 mmol/L   CO2 28 22 - 32 mmol/L   Glucose, Bld 109 (H) 65 - 99 mg/dL   BUN 11 6 - 20 mg/dL   Creatinine, Ser 0.78 0.61 - 1.24 mg/dL   Calcium 9.5 8.9 - 10.3 mg/dL   GFR calc non Af Amer >60 >60 mL/min   GFR calc Af Amer >60 >60 mL/min   Anion gap 11 5 - 15  CBC with Differential/Platelet  Result Value Ref Range   WBC 10.3 4.0 - 10.5 K/uL   RBC 4.23 4.22 - 5.81 MIL/uL   Hemoglobin 13.3 13.0 - 17.0 g/dL   HCT 39.8 39.0 - 52.0 %   MCV 94.1 78.0 - 100.0 fL   MCH 31.4 26.0 - 34.0 pg   MCHC 33.4 30.0 - 36.0 g/dL   RDW 13.3 11.5 - 15.5 %   Platelets 327 150 - 400 K/uL   Neutrophils Relative % 80 %   Neutro Abs 8.2 (H) 1.7 - 7.7 K/uL   Lymphocytes Relative 12 %   Lymphs Abs 1.3 0.7 - 4.0 K/uL   Monocytes Relative 7 %   Monocytes Absolute 0.7 0.1 - 1.0 K/uL   Eosinophils Relative 1 %   Eosinophils Absolute 0.1 0.0 - 0.7 K/uL   Basophils Relative 0 %   Basophils Absolute 0.0 0.0 - 0.1 K/uL  Urinalysis, Microscopic (reflex)  Result Value Ref Range   RBC / HPF TOO NUMEROUS TO COUNT 0 - 5 RBC/hpf   WBC, UA TOO NUMEROUS TO COUNT 0 - 5 WBC/hpf   Bacteria, UA FEW (A) NONE SEEN   Squamous Epithelial / LPF NONE SEEN NONE SEEN   WBC Clumps PRESENT    Dg Ribs Unilateral W/chest Right  Result Date: 12/05/2016 CLINICAL DATA:  [place] and injured ribs today. EXAM: RIGHT RIBS AND CHEST - 3+ VIEW COMPARISON:  Chest x-ray and rib films  10/11/2009 FINDINGS: The cardiac silhouette, mediastinal and hilar contours are within normal limits and stable. There is mild tortuosity and calcification of the thoracic aorta. The lungs demonstrate stable emphysematous changes and pulmonary scarring. No definite acute pulmonary process. No pleural effusion or pneumothorax. Stable biapical pleural and parenchymal scarring changes. Dedicated rib films do not demonstrate any definite acute right rib fractures. IMPRESSION: No acute cardiopulmonary findings.  Chronic lung changes. No definite acute right-sided rib fractures. Electronically Signed   By: Marijo Sanes M.D.   On: 12/05/2016 13:27   Dg Elbow Complete Right  Result Date: 12/05/2016 CLINICAL DATA:  Fall.  Right elbow pain. EXAM: RIGHT ELBOW - COMPLETE 3+ VIEW COMPARISON:  None. FINDINGS: Slight obliquity of the lateral view  limits assessment for joint effusion. No fracture, dislocation, definite joint effusion, suspicious focal osseous lesion or radiopaque foreign body. Diffuse osteopenia. Minimal osteoarthritis at the ulnohumeral compartment of the right elbow joint. Small enthesophytes at the lateral epicondyle. IMPRESSION: No fracture, dislocation or definite joint effusion in the right elbow. Diffuse osteopenia. Minimal osteoarthritis. Electronically Signed   By: Ilona Sorrel M.D.   On: 12/05/2016 13:29    Initial Impression / Assessment and Plan / ED Course  I have reviewed the triage vital signs and the nursing notes.  Pertinent labs & imaging results that were available during my care of the patient were reviewed by me and considered in my medical decision making (see chart for details).   7:55 PM patient resting comfortably.  Asymptomatic.  In light of pyuria, bacteriuria and recent instrumentation will treat with Cipro for urinary tract infection.  Urine sent for culture.  Patient to keep his scheduled appointment with his urologist December 26, 2016   Final Clinical Impressions(s) /  ED Diagnoses  Diagnosis urinary tract infection with  hematuria, unspecified site Final diagnoses:  None    New Prescriptions New Prescriptions   No medications on file     Orlie Dakin, MD 12/23/16 2004

## 2016-12-23 NOTE — Discharge Instructions (Signed)
Keep your scheduled appointment with your urologist for December 26, 2016

## 2016-12-23 NOTE — ED Triage Notes (Signed)
Per pt, states he had a foley catheter removed about 5 days ago-states Dr Lovena Neighbours told him to drink lots of water-states when he used the restroom this after noon there was some blood in his urine-he went at a later time and there wasn't any-states there was some blood in urine the 3rd time-states no problems urinating or emptying his bladder-

## 2016-12-24 DIAGNOSIS — Z23 Encounter for immunization: Secondary | ICD-10-CM | POA: Diagnosis not present

## 2016-12-26 LAB — URINE CULTURE: SPECIAL REQUESTS: NORMAL

## 2016-12-27 ENCOUNTER — Telehealth: Payer: Self-pay | Admitting: *Deleted

## 2016-12-27 NOTE — Telephone Encounter (Signed)
Post ED Visit - Positive Culture Follow-up  Culture report reviewed by antimicrobial stewardship pharmacist:  []  Rick [name], Pharm.D. []  Rick [name], Pharm.D., BCPS AQ-ID []  Rick [name], Pharm.D., BCPS []  Rick [name], Pharm.D., BCPS []  [place], Pharm.D., BCPS, AAHIVP []  Rick [name], Pharm.D., BCPS, AAHIVP []  Rick [name], PharmD, BCPS []  Rick [name], PharmD, BCPS []  Rick [name], PharmD, BCPS Rick [name], PharmD  Positive urine culture Treated with Ciprofloxacin HCL, organism sensitive to the same and no further patient follow-up is required at this time.  Rick [name] 12/27/2016, 12:10 PM

## 2016-12-28 DIAGNOSIS — K219 Gastro-esophageal reflux disease without esophagitis: Secondary | ICD-10-CM | POA: Diagnosis not present

## 2016-12-28 DIAGNOSIS — R0982 Postnasal drip: Secondary | ICD-10-CM | POA: Diagnosis not present

## 2016-12-29 DIAGNOSIS — R339 Retention of urine, unspecified: Secondary | ICD-10-CM | POA: Diagnosis not present

## 2017-01-03 ENCOUNTER — Other Ambulatory Visit: Payer: Self-pay

## 2017-01-04 ENCOUNTER — Ambulatory Visit (HOSPITAL_BASED_OUTPATIENT_CLINIC_OR_DEPARTMENT_OTHER)
Admission: RE | Admit: 2017-01-04 | Discharge: 2017-01-04 | Disposition: A | Discharge status: Home / Self Care | Payer: Medicare Other | Source: Ambulatory Visit | Attending: Family Medicine | Admitting: Family Medicine

## 2017-01-04 ENCOUNTER — Other Ambulatory Visit: Payer: Self-pay

## 2017-01-04 ENCOUNTER — Ambulatory Visit (INDEPENDENT_AMBULATORY_CARE_PROVIDER_SITE_OTHER): Payer: Medicare Other | Admitting: Family Medicine

## 2017-01-04 ENCOUNTER — Telehealth: Payer: Self-pay | Admitting: Family Medicine

## 2017-01-04 ENCOUNTER — Encounter (HOSPITAL_BASED_OUTPATIENT_CLINIC_OR_DEPARTMENT_OTHER): Payer: Self-pay

## 2017-01-04 ENCOUNTER — Telehealth: Payer: Self-pay

## 2017-01-04 ENCOUNTER — Other Ambulatory Visit: Payer: Self-pay | Admitting: Family Medicine

## 2017-01-04 VITALS — BP 134/74 | HR 97 | Temp 97.3°F | Resp 16 | Ht 74.0 in | Wt 108.8 lb

## 2017-01-04 DIAGNOSIS — R05 Cough: Secondary | ICD-10-CM

## 2017-01-04 DIAGNOSIS — N329 Bladder disorder, unspecified: Secondary | ICD-10-CM | POA: Diagnosis not present

## 2017-01-04 DIAGNOSIS — R634 Abnormal weight loss: Secondary | ICD-10-CM

## 2017-01-04 DIAGNOSIS — I6523 Occlusion and stenosis of bilateral carotid arteries: Secondary | ICD-10-CM | POA: Diagnosis not present

## 2017-01-04 DIAGNOSIS — M799 Soft tissue disorder, unspecified: Secondary | ICD-10-CM | POA: Insufficient documentation

## 2017-01-04 DIAGNOSIS — J449 Chronic obstructive pulmonary disease, unspecified: Secondary | ICD-10-CM | POA: Insufficient documentation

## 2017-01-04 DIAGNOSIS — R935 Abnormal findings on diagnostic imaging of other abdominal regions, including retroperitoneum: Secondary | ICD-10-CM

## 2017-01-04 DIAGNOSIS — N400 Enlarged prostate without lower urinary tract symptoms: Secondary | ICD-10-CM | POA: Diagnosis present

## 2017-01-04 DIAGNOSIS — R059 Cough, unspecified: Secondary | ICD-10-CM

## 2017-01-04 DIAGNOSIS — R109 Unspecified abdominal pain: Secondary | ICD-10-CM | POA: Diagnosis not present

## 2017-01-04 DIAGNOSIS — K829 Disease of gallbladder, unspecified: Secondary | ICD-10-CM | POA: Insufficient documentation

## 2017-01-04 DIAGNOSIS — K117 Disturbances of salivary secretion: Secondary | ICD-10-CM

## 2017-01-04 MED ORDER — OMEPRAZOLE 40 MG PO CPDR: 40.0000 mg | DELAYED_RELEASE_CAPSULE | Freq: Two times a day (BID) | ORAL | 1 refills | Status: AC

## 2017-01-04 MED ORDER — IOPAMIDOL (ISOVUE-300) INJECTION 61%
100.0000 mL | Freq: Once | INTRAVENOUS | Status: AC | PRN
  Administered 2017-01-04: 100 mL via INTRAVENOUS

## 2017-01-04 NOTE — Progress Notes (Signed)
Patient ID: Rick [name], male   DOB: Jul 31, 1933, 83 y.o.   MRN: [id number]    Subjective:  I acted as a Education administrator for Dr. Carollee Herter.  Rick [name], Rick [name]   Patient ID: Rick [name], male    DOB: 10/17/1933, 83 y.o.   MRN: [id number]  Chief Complaint  Patient presents with  . Ptyalism    HPI  Patient is in today for excessive saliva.  He has been to so many doctors and they have told him to come back to follow up with PCP.  He is very irritated.  He is not eating because of this and has lost a lot of weight in the last 2 weeks.   ent wanted to do laryngoscope and gi wanted to do barium swallow but pt refused at the time.    Patient Care Team: Carollee Herter, Alferd Apa, DO as PCP - General Lowella Bandy, MD as Consulting Physician (Urology) Mal Misty, MD as Consulting Physician (Vascular Surgery) Jari Pigg, MD as Consulting Physician (Dermatology) Calvert Cantor, MD as Consulting Physician (Ophthalmology)   Past Medical History:  Diagnosis Date  . BPH (benign prostatic hyperplasia)   . Carotid artery occlusion   . Chronic ear infection    left  . Hx of colonic polyps   . Hyperlipidemia   . Hypertension   . Kidney stones   . Osteoporosis     Past Surgical History:  Procedure Laterality Date  . TONSILLECTOMY      Family History  Problem Relation Age of Onset  . Stroke Mother   . Seizures Father   . Colon cancer Neg Hx     Social History   Socioeconomic History  . Marital status: Married    Spouse name: Not on file  . Number of children: 2  . Years of education: Not on file  . Highest education level: Not on file  Social Needs  . Financial resource strain: Not on file  . Food insecurity - worry: Not on file  . Food insecurity - inability: Not on file  . Transportation needs - medical: Not on file  . Transportation needs - non-medical: Not on file  Occupational History  . Occupation: retired--[employer]  Tobacco Use  . Smoking status: Light Tobacco Smoker   Packs/day: 0.50    Years: 50.00    Pack years: 25.00    Types: Cigarettes  . Smokeless tobacco: Never Used  . Tobacco comment: smokes 6 cigarettes daily max  Substance and Sexual Activity  . Alcohol use: No    Alcohol/week: 0.0 oz  . Drug use: No  . Sexual activity: Not on file  Other Topics Concern  . Not on file  Social History Narrative   Retired- [employer]   Married   Regular exercise- yes    Outpatient Medications Prior to Visit  Medication Sig Dispense Refill  . atorvastatin (LIPITOR) 20 MG tablet TAKE 1 TABLET BY MOUTH EVERY DAY 90 tablet 0  . Calcium-Vitamin D (CALTRATE 600 PLUS-VIT D PO) Take 600 mg by mouth 2 (two) times daily.     . ciprofloxacin (CIPRO) 500 MG tablet Take 1 tablet (500 mg total) by mouth 2 (two) times daily. One po bid x 7 days 14 tablet 0  . clopidogrel (PLAVIX) 75 MG tablet TAKE 1 TABLET BY MOUTH EVERY DAY 90 tablet 3  . Cranberry-Cholecalciferol 4200-500 MG-UNIT CAPS Take 1 capsule by mouth daily.    . cyanocobalamin 100 MCG tablet Take 100 mcg by mouth daily.    Marland Kitchen  denosumab (PROLIA) 60 MG/ML SOLN injection Inject 60 mg into the skin every 6 (six) months. Administer in upper arm, thigh, or abdomen    . MULTIPLE VITAMIN PO Take by mouth.      . Saw Palmetto 450 MG CAPS Take by mouth.      . tamsulosin (FLOMAX) 0.4 MG CAPS capsule Take 1 capsule (0.4 mg total) by mouth daily. 30 capsule 0  . VITAMIN D, CHOLECALCIFEROL, PO Take by mouth.      Marland Kitchen omeprazole (PRILOSEC) 40 MG capsule Take 1 capsule (40 mg total) by mouth 2 (two) times daily. 60 capsule 1   No facility-administered medications prior to visit.     Allergies  Allergen Reactions  . Aspirin Hives and Itching  . Penicillins Hives and Itching    Has patient had a PCN reaction causing immediate rash, facial/tongue/throat swelling, SOB or lightheadedness with hypotension: No Has patient had a PCN reaction causing severe rash involving mucus membranes or skin necrosis: No Has patient had a PCN  reaction that required hospitalization: No Has patient had a PCN reaction occurring within the last 10 years: No If all of the above answers are "NO", then may proceed with Cephalosporin use.     ROS     Objective:    Physical Exam  Constitutional: He is oriented to person, place, and time. Vital signs are normal. He appears well-developed and well-nourished. He appears cachectic. He is sleeping.  HENT:  Head: Normocephalic and atraumatic.  Mouth/Throat: Oropharynx is clear and moist.  Eyes: EOM are normal. Pupils are equal, round, and reactive to light.  Neck: Normal range of motion. Neck supple. No thyromegaly present.  Cardiovascular: Normal rate and regular rhythm.  No murmur heard. Pulmonary/Chest: Effort normal and breath sounds normal. No respiratory distress. He has no wheezes. He has no rales. He exhibits no tenderness.  Musculoskeletal: He exhibits no edema or tenderness.  Neurological: He is alert and oriented to person, place, and time.  Skin: Skin is warm and dry.  Psychiatric: He has a normal mood and affect. His behavior is normal. Judgment and thought content normal.  Nursing note and vitals reviewed.   BP 134/74 (BP Location: Right Arm, Cuff Size: Normal)   Pulse 97   Temp (!) 97.3 F (36.3 C) (Oral)   Resp 16   Ht 6\' 2"  (1.88 m)   Wt 108 lb 12.8 oz (49.4 kg)   SpO2 97%   BMI 13.97 kg/m  Wt Readings from Last 3 Encounters:  01/04/17 108 lb 12.8 oz (49.4 kg)  12/18/16 121 lb (54.9 kg)  12/05/16 121 lb (54.9 kg)   BP Readings from Last 3 Encounters:  01/04/17 134/74  12/23/16 (!) 142/70  12/18/16 (!) 148/66     Immunization History  Administered Date(s) Administered  . Influenza Whole 11/28/1998, 12/25/2006, 12/09/2008, 11/14/2010, 11/20/2011, 11/20/2012  . Influenza, High Dose Seasonal PF 12/24/2016  . Influenza-Unspecified 01/11/2014, 10/29/2014, 11/28/2015  . Pneumococcal Conjugate-13 03/06/2014  . Pneumococcal Polysaccharide-23 05/22/2002  .  Td 12/01/1996, 03/15/2005  . Tdap 05/31/2015  . Zoster 09/13/2005, 03/15/2006, 05/29/2006    Health Maintenance  Topic Date Due  . DEXA SCAN  10/04/2017  . TETANUS/TDAP  05/30/2025  . INFLUENZA VACCINE  Completed  . PNA vac Low Risk Adult  Completed    Lab Results  Component Value Date   WBC 10.3 12/23/2016   HGB 13.3 12/23/2016   HCT 39.8 12/23/2016   PLT 327 12/23/2016   GLUCOSE 109 (H) 12/23/2016  CHOL 137 09/18/2016   TRIG 89.0 09/18/2016   HDL 49.50 09/18/2016   LDLCALC 70 09/18/2016   ALT 10 09/18/2016   AST 16 09/18/2016   NA 137 12/23/2016   K 4.6 12/23/2016   CL 98 (L) 12/23/2016   CREATININE 0.78 12/23/2016   BUN 11 12/23/2016   CO2 28 12/23/2016   TSH 0.71 04/14/2010   PSA 5.00 (H) 03/06/2014   HGBA1C 5.6 09/25/2016    Lab Results  Component Value Date   TSH 0.71 04/14/2010   Lab Results  Component Value Date   WBC 10.3 12/23/2016   HGB 13.3 12/23/2016   HCT 39.8 12/23/2016   MCV 94.1 12/23/2016   PLT 327 12/23/2016   Lab Results  Component Value Date   NA 137 12/23/2016   K 4.6 12/23/2016   CO2 28 12/23/2016   GLUCOSE 109 (H) 12/23/2016   BUN 11 12/23/2016   CREATININE 0.78 12/23/2016   BILITOT 0.3 09/18/2016   ALKPHOS 75 09/18/2016   AST 16 09/18/2016   ALT 10 09/18/2016   PROT 6.4 09/18/2016   ALBUMIN 3.8 09/18/2016   CALCIUM 9.5 12/23/2016   ANIONGAP 11 12/23/2016   GFR 93.11 09/25/2016   Lab Results  Component Value Date   CHOL 137 09/18/2016   Lab Results  Component Value Date   HDL 49.50 09/18/2016   Lab Results  Component Value Date   LDLCALC 70 09/18/2016   Lab Results  Component Value Date   TRIG 89.0 09/18/2016   Lab Results  Component Value Date   CHOLHDL 3 09/18/2016   Lab Results  Component Value Date   HGBA1C 5.6 09/25/2016         Assessment & Plan:   Problem List Items Addressed This Visit    None    Visit Diagnoses    Weight loss, non-intentional    -  Primary   Relevant Orders   DG  Chest 2 View (Completed)   CT Abdomen Pelvis W Contrast (Completed)   Cough       Relevant Orders   DG Chest 2 View (Completed)   Abnormal weight loss       Relevant Orders   CT Abdomen Pelvis W Contrast (Completed)      I have changed Elyn Aquas. Kneebone's omeprazole. I am also having him maintain his Calcium-Vitamin D (CALTRATE 600 PLUS-VIT D PO), MULTIPLE VITAMIN PO, Saw Palmetto, (VITAMIN D, CHOLECALCIFEROL, PO), cyanocobalamin, denosumab, Cranberry-Cholecalciferol, clopidogrel, atorvastatin, tamsulosin, and ciprofloxacin.  Meds ordered this encounter  Medications  . omeprazole (PRILOSEC) 40 MG capsule    Sig: Take 1 capsule (40 mg total) 2 (two) times daily by mouth.    Dispense:  60 capsule    Refill:  1  d/w with GI and pt again.  Pt is willing to have barium swallow done  CT --  ? Prostate ca--- will get asap appointment with urology for further evaluation CMA served as scribe during this visit. History, Physical and Plan performed by medical provider. Documentation and orders reviewed and attested to.  Ann Held, DO

## 2017-01-04 NOTE — Telephone Encounter (Signed)
Spoke with pt Rick [name] has agreed to let Dr Havery Moros do the barium swallow Rick [name] has also agreed to seeing urology for his prostate.

## 2017-01-04 NOTE — Telephone Encounter (Signed)
Awaiting feedback from Triage at urology for his prostate follow up. Will follow up tomorrow. Pt has urgent referral.

## 2017-01-04 NOTE — Telephone Encounter (Signed)
Patient advised of barium swallow scheduled for 11/16 @ [facility] arrive 9:15 for 9:30 test. Patient understands to be NPO 3 hours prior. Patient would like to change date/time of this test, given number to radiology so that he can reschedule this appointment at his convenience.

## 2017-01-04 NOTE — Patient Instructions (Signed)
Failure to Thrive, Adult Failure to thrive is a group of problems. These problems include eating too little and losing weight. People who have this condition may do fewer and fewer activities over time. They may lose interest in being with friends or they may not want to eat or drink. Follow these instructions at home:  Take medicines only as told by your doctor.  Eat a healthy, well-balanced diet. Make sure that you eat enough.  Be active. Do strength training. A physical therapist can help to set up an exercise program that fits you.  Make sure that you are safe at home.  Make sure that you have a plan for what to do if you cannot make decisions for yourself. Contact a doctor if:  You are not able to eat well.  You are not able to move around.  You feel very sad.  You feel very hopeless. Get help right away if:  You think about ending your life.  You cannot eat or drink.  You do not get out of bed.  Staying at home is not safe.  You have a fever. This information is not intended to replace advice given to you by your health care provider. Make sure you discuss any questions you have with your health care provider. Document Released: 02/02/2011 Document Revised: 07/22/2015 Document Reviewed: 05/11/2014 Elsevier Interactive Patient Education  2018 Elsevier Inc.  

## 2017-01-05 ENCOUNTER — Encounter: Payer: Self-pay | Admitting: Family Medicine

## 2017-01-05 NOTE — Telephone Encounter (Signed)
Rick [name] called Dr. Janice Norrie office and the nurse stated that imaging report is on his desk for review.  She will call and update patient.  If we have not heard anything back today, [name] will call office again on Monday.

## 2017-01-08 ENCOUNTER — Telehealth: Payer: Self-pay | Admitting: Family Medicine

## 2017-01-08 NOTE — Telephone Encounter (Signed)
Left message on machine to call back  

## 2017-01-08 NOTE — Telephone Encounter (Signed)
Pt request Rick [name] call pt after 1:00 today to discuss salivation problem. Pt states Rick [name] is aware and has called him before. Call pt on listed home number.

## 2017-01-12 ENCOUNTER — Ambulatory Visit (HOSPITAL_COMMUNITY): Payer: Federal, State, Local not specified - PPO

## 2017-01-12 ENCOUNTER — Telehealth: Payer: Self-pay | Admitting: Family Medicine

## 2017-01-12 DIAGNOSIS — R059 Cough, unspecified: Secondary | ICD-10-CM

## 2017-01-12 DIAGNOSIS — R05 Cough: Secondary | ICD-10-CM

## 2017-01-12 NOTE — Telephone Encounter (Signed)
Ok to put referral In   Dx cough

## 2017-01-12 NOTE — Telephone Encounter (Signed)
Copied from [place] [id number]. Topic: Quick Communication - See Telephone Encounter >> Jan 12, 2017  3:00 PM Rosalin Hawking wrote: CRM for notification. See Telephone encounter for:  01/12/17.   Pt came in office stating is needing a referral to Pulmonary Doctor, pt would like to be referred to Dr. Jerilynn Mages. Earley Abide from [facility] (address [address], [address], [place] tel [phone] and fax [phone].Pt requested to have referral placed for this Dr. mentioned above. Pt would like to have it done ASAP. (since pt already has seen two pulmonologist and they have not done anything for him stated the pt)  Please advise ASAP.

## 2017-01-16 NOTE — Telephone Encounter (Signed)
Referral placed.

## 2017-01-22 ENCOUNTER — Telehealth: Payer: Self-pay | Admitting: Family Medicine

## 2017-01-22 NOTE — Telephone Encounter (Signed)
I put in a referral for this patient on 01/16/17  can someone call patient with status.

## 2017-01-22 NOTE — Telephone Encounter (Signed)
Patient walked in and wants a referral to [facility] [phone] Pulmonology** Patient stts he waited over a week and needs a referral ASAP. Patient stts he also wants a call once that has been approved by her. Patient stts this should not have took this long bc this is the second request.   Thanks

## 2017-01-26 ENCOUNTER — Other Ambulatory Visit: Payer: Self-pay | Admitting: Gastroenterology

## 2017-02-02 DIAGNOSIS — R05 Cough: Secondary | ICD-10-CM | POA: Diagnosis not present

## 2017-02-02 DIAGNOSIS — K117 Disturbances of salivary secretion: Secondary | ICD-10-CM | POA: Diagnosis not present

## 2017-02-02 DIAGNOSIS — R0982 Postnasal drip: Secondary | ICD-10-CM | POA: Diagnosis not present

## 2017-02-02 DIAGNOSIS — K219 Gastro-esophageal reflux disease without esophagitis: Secondary | ICD-10-CM | POA: Diagnosis not present

## 2017-02-14 DIAGNOSIS — K117 Disturbances of salivary secretion: Secondary | ICD-10-CM | POA: Diagnosis not present

## 2017-02-14 DIAGNOSIS — J479 Bronchiectasis, uncomplicated: Secondary | ICD-10-CM | POA: Diagnosis not present

## 2017-02-14 DIAGNOSIS — F17210 Nicotine dependence, cigarettes, uncomplicated: Secondary | ICD-10-CM | POA: Diagnosis not present

## 2017-02-14 DIAGNOSIS — R05 Cough: Secondary | ICD-10-CM | POA: Diagnosis not present

## 2017-02-14 DIAGNOSIS — J439 Emphysema, unspecified: Secondary | ICD-10-CM | POA: Diagnosis not present

## 2017-02-28 ENCOUNTER — Other Ambulatory Visit: Payer: Self-pay | Admitting: *Deleted

## 2017-02-28 MED ORDER — ATORVASTATIN CALCIUM 20 MG PO TABS: 20.0000 mg | ORAL_TABLET | Freq: Every day | ORAL | 1 refills | Status: DC

## 2017-03-02 DIAGNOSIS — K219 Gastro-esophageal reflux disease without esophagitis: Secondary | ICD-10-CM | POA: Diagnosis not present

## 2017-03-02 DIAGNOSIS — R1314 Dysphagia, pharyngoesophageal phase: Secondary | ICD-10-CM | POA: Diagnosis not present

## 2017-03-02 DIAGNOSIS — J479 Bronchiectasis, uncomplicated: Secondary | ICD-10-CM | POA: Diagnosis not present

## 2017-03-02 DIAGNOSIS — F17210 Nicotine dependence, cigarettes, uncomplicated: Secondary | ICD-10-CM | POA: Diagnosis not present

## 2017-03-02 DIAGNOSIS — R05 Cough: Secondary | ICD-10-CM | POA: Diagnosis not present

## 2017-03-02 DIAGNOSIS — J438 Other emphysema: Secondary | ICD-10-CM | POA: Diagnosis not present

## 2017-03-06 DIAGNOSIS — H7202 Central perforation of tympanic membrane, left ear: Secondary | ICD-10-CM | POA: Diagnosis not present

## 2017-03-22 ENCOUNTER — Telehealth: Payer: Self-pay | Admitting: Family Medicine

## 2017-03-22 NOTE — Telephone Encounter (Signed)
Prolia benefits received [place] Not required $185 deductible 20% co-insurance [employer] should cover some    Patient may owe approximately $385 OOP  Patient due after 04/11/17  Letter mailed to inform patient of benefits and to schedule

## 2017-04-09 DIAGNOSIS — J438 Other emphysema: Secondary | ICD-10-CM | POA: Diagnosis not present

## 2017-04-09 DIAGNOSIS — K219 Gastro-esophageal reflux disease without esophagitis: Secondary | ICD-10-CM | POA: Diagnosis not present

## 2017-04-09 DIAGNOSIS — F17210 Nicotine dependence, cigarettes, uncomplicated: Secondary | ICD-10-CM | POA: Diagnosis not present

## 2017-04-09 DIAGNOSIS — J479 Bronchiectasis, uncomplicated: Secondary | ICD-10-CM | POA: Diagnosis not present

## 2017-04-25 DIAGNOSIS — K117 Disturbances of salivary secretion: Secondary | ICD-10-CM | POA: Diagnosis not present

## 2017-04-25 DIAGNOSIS — K219 Gastro-esophageal reflux disease without esophagitis: Secondary | ICD-10-CM | POA: Diagnosis not present

## 2017-05-03 ENCOUNTER — Telehealth: Payer: Self-pay | Admitting: *Deleted

## 2017-05-03 NOTE — Telephone Encounter (Signed)
Left message on machine that it was no message in chart that anyone has called.

## 2017-05-03 NOTE — Telephone Encounter (Signed)
Copied from [place] [id number]. Topic: General - Call Back - No Documentation >> May 02, 2017 11:09 AM Ahmed Prima L wrote: Reason for CRM: pt missed call from office this am. Please call pt back [phone]

## 2017-05-08 ENCOUNTER — Ambulatory Visit (INDEPENDENT_AMBULATORY_CARE_PROVIDER_SITE_OTHER): Payer: Medicare Other | Admitting: *Deleted

## 2017-05-08 DIAGNOSIS — M810 Age-related osteoporosis without current pathological fracture: Secondary | ICD-10-CM

## 2017-05-08 MED ORDER — DENOSUMAB 60 MG/ML ~~LOC~~ SOLN
60.0000 mg | Freq: Once | SUBCUTANEOUS | Status: AC
  Administered 2017-05-08: 60 mg via SUBCUTANEOUS

## 2017-05-08 NOTE — Progress Notes (Signed)
Reviewed  Rick Malcomb R Lowne Chase, DO  

## 2017-05-08 NOTE — Progress Notes (Signed)
Pre visit review using our clinic review tool, if applicable. No additional management support is needed unless otherwise documented below in the visit note.  Pt here for Prolia injection.  Prolia 60mg /mL given SQ right arm and pt tolerated injection well.   Advised pt he will be called when next injection is due in 6 months.

## 2017-05-18 ENCOUNTER — Ambulatory Visit: Payer: Self-pay | Admitting: *Deleted

## 2017-05-18 NOTE — Telephone Encounter (Signed)
Patient phoned asking what to take for diarrhea. Today is day 3 with diarrhea. He stated he is able to eat and drink plenty. Suggested he use OTC immodium as directed on package. Recommended a soft, bland diet for 24 hours and to continue plenty fluids to prevent dehydration. Call back if diarrhea does not improve.

## 2017-06-25 DIAGNOSIS — K117 Disturbances of salivary secretion: Secondary | ICD-10-CM | POA: Diagnosis not present

## 2017-07-16 DIAGNOSIS — N401 Enlarged prostate with lower urinary tract symptoms: Secondary | ICD-10-CM | POA: Diagnosis not present

## 2017-07-16 DIAGNOSIS — R339 Retention of urine, unspecified: Secondary | ICD-10-CM | POA: Diagnosis not present

## 2017-07-24 ENCOUNTER — Other Ambulatory Visit: Payer: Self-pay

## 2017-07-24 ENCOUNTER — Emergency Department (HOSPITAL_COMMUNITY)
Admission: EM | Admit: 2017-07-24 | Discharge: 2017-07-24 | Disposition: A | Discharge status: Home / Self Care | Payer: Medicare Other | Attending: Emergency Medicine | Admitting: Emergency Medicine

## 2017-07-24 ENCOUNTER — Encounter (HOSPITAL_COMMUNITY): Payer: Self-pay

## 2017-07-24 DIAGNOSIS — R319 Hematuria, unspecified: Secondary | ICD-10-CM | POA: Diagnosis not present

## 2017-07-24 DIAGNOSIS — N401 Enlarged prostate with lower urinary tract symptoms: Secondary | ICD-10-CM | POA: Diagnosis not present

## 2017-07-24 DIAGNOSIS — R1030 Lower abdominal pain, unspecified: Secondary | ICD-10-CM | POA: Diagnosis not present

## 2017-07-24 DIAGNOSIS — F17210 Nicotine dependence, cigarettes, uncomplicated: Secondary | ICD-10-CM | POA: Diagnosis not present

## 2017-07-24 DIAGNOSIS — R339 Retention of urine, unspecified: Secondary | ICD-10-CM | POA: Insufficient documentation

## 2017-07-24 DIAGNOSIS — I10 Essential (primary) hypertension: Secondary | ICD-10-CM | POA: Insufficient documentation

## 2017-07-24 DIAGNOSIS — N390 Urinary tract infection, site not specified: Secondary | ICD-10-CM | POA: Insufficient documentation

## 2017-07-24 DIAGNOSIS — Z79899 Other long term (current) drug therapy: Secondary | ICD-10-CM | POA: Insufficient documentation

## 2017-07-24 DIAGNOSIS — R338 Other retention of urine: Secondary | ICD-10-CM

## 2017-07-24 LAB — CBC WITH DIFFERENTIAL/PLATELET
BASOS PCT: 0 %
Basophils Absolute: 0 10*3/uL (ref 0.0–0.1)
Eosinophils Absolute: 0 10*3/uL (ref 0.0–0.7)
Eosinophils Relative: 0 %
HEMATOCRIT: 34.6 % — AB (ref 39.0–52.0)
Hemoglobin: 11 g/dL — ABNORMAL LOW (ref 13.0–17.0)
LYMPHS ABS: 0.7 10*3/uL (ref 0.7–4.0)
LYMPHS PCT: 10 %
MCH: 30 pg (ref 26.0–34.0)
MCHC: 31.8 g/dL (ref 30.0–36.0)
MCV: 94.3 fL (ref 78.0–100.0)
MONO ABS: 0.6 10*3/uL (ref 0.1–1.0)
MONOS PCT: 9 %
NEUTROS ABS: 5.9 10*3/uL (ref 1.7–7.7)
Neutrophils Relative %: 81 %
Platelets: 239 10*3/uL (ref 150–400)
RBC: 3.67 MIL/uL — ABNORMAL LOW (ref 4.22–5.81)
RDW: 14.5 % (ref 11.5–15.5)
WBC: 7.3 10*3/uL (ref 4.0–10.5)

## 2017-07-24 LAB — URINALYSIS, ROUTINE W REFLEX MICROSCOPIC
BILIRUBIN URINE: NEGATIVE
GLUCOSE, UA: NEGATIVE mg/dL
KETONES UR: NEGATIVE mg/dL
NITRITE: NEGATIVE
PH: 6 (ref 5.0–8.0)
Protein, ur: 100 mg/dL — AB
RBC / HPF: >50 RBC/hpf — ABNORMAL HIGH (ref 0–5)
Specific Gravity, Urine: 1.014 (ref 1.005–1.030)
WBC, UA: >50 WBC/hpf — ABNORMAL HIGH (ref 0–5)

## 2017-07-24 LAB — BASIC METABOLIC PANEL
ANION GAP: 11 (ref 5–15)
BUN: 17 mg/dL (ref 6–20)
CALCIUM: 8.9 mg/dL (ref 8.9–10.3)
CO2: 25 mmol/L (ref 22–32)
Chloride: 103 mmol/L (ref 101–111)
Creatinine, Ser: 0.89 mg/dL (ref 0.61–1.24)
GFR calc Af Amer: >60 mL/min (ref >60)
GFR calc non Af Amer: >60 mL/min (ref >60)
GLUCOSE: 118 mg/dL — AB (ref 65–99)
Potassium: 4.2 mmol/L (ref 3.5–5.1)
Sodium: 139 mmol/L (ref 135–145)

## 2017-07-24 MED ORDER — FOSFOMYCIN TROMETHAMINE 3 G PO PACK
3.0000 g | PACK | Freq: Once | ORAL | Status: AC
  Administered 2017-07-24: 3 g via ORAL
  Filled 2017-07-24: qty 3

## 2017-07-24 NOTE — Discharge Instructions (Signed)
If you develop any fevers, vomiting, or lower abdominal pain, or any other new/concerning symptoms then return to the ER for evaluation.  Otherwise follow-up with your urologist in about 1 week to have the Foley catheter removed.

## 2017-07-24 NOTE — ED Notes (Signed)
Pt changed to leg bag for discharge, pt getting dressed.

## 2017-07-24 NOTE — Progress Notes (Signed)
Pt states preferred name is "Rick [name]".

## 2017-07-24 NOTE — ED Notes (Signed)
Pt d/c home no c/o pain, d/c paperwork given teach back done. I will continue to monitor.

## 2017-07-24 NOTE — ED Provider Notes (Signed)
[facility] DEPT Provider Note   CSN: [id number] Arrival date & time: 07/24/17  1711     History   Chief Complaint Chief Complaint  Patient presents with  . Urinary Retention    HPI Rick [name] is a 84 y.o. male.  HPI  84 year old male with history of BPH presents with urinary retention.  He states that he has not been able to urinate all day.  He recently had a Foley catheter placed for retention and had it in about 8 days.  It was removed by urology.  Now unable to urinate all day.  He is having significant lower abdominal pain.  He has noticed some blood at his urethral meatus.  No back pain, fevers, vomiting.  Pain is severe.  Past Medical History:  Diagnosis Date  . BPH (benign prostatic hyperplasia)   . Carotid artery occlusion   . Chronic ear infection    left  . Hx of colonic polyps   . Hyperlipidemia   . Hypertension   . Kidney stones   . Osteoporosis     Patient Active Problem List   Diagnosis Date Noted  . Medicare annual wellness visit, subsequent 03/22/2016  . Carotid bruit 10/10/2010  . Preventative health care 10/10/2010  . RIB PAIN, RIGHT SIDED 10/11/2009  . UTI 09/28/2009  . TOBACCO USER 09/23/2008  . OSTEOPENIA 05/10/2008  . BACK PAIN, ACUTE 05/08/2008  . KNEE SPRAIN, LEFT, MEDIAL COLLATERAL LIGAMENT 01/02/2007  . TESTOSTERONE DEFICIENCY 10/02/2006  . Hyperlipidemia 08/01/2006  . ERECTILE DYSFUNCTION 08/01/2006  . BENIGN PROSTATIC HYPERTROPHY, HX OF 08/01/2006  . Essential hypertension 07/27/2006  . Carotid stenosis 07/27/2006  . MICROSCOPIC HEMATURIA 07/27/2006  . Osteoporosis 07/27/2006  . COLONIC POLYPS, HX OF 07/27/2006    Past Surgical History:  Procedure Laterality Date  . TONSILLECTOMY          Home Medications    Prior to Admission medications   Medication Sig Start Date End Date Taking? Authorizing Provider  atorvastatin (LIPITOR) 20 MG tablet Take 1 tablet (20 mg total) by mouth daily.  02/28/17  Yes Roma Schanz R, DO  clopidogrel (PLAVIX) 75 MG tablet TAKE 1 TABLET BY MOUTH EVERY DAY 12/01/16  Yes Roma Schanz R, DO  omeprazole (PRILOSEC) 40 MG capsule Take 1 capsule (40 mg total) 2 (two) times daily by mouth. 01/04/17  Yes Roma Schanz R, DO  tamsulosin (FLOMAX) 0.4 MG CAPS capsule Take 1 capsule (0.4 mg total) by mouth daily. 12/18/16  Yes Dorie Rank, MD  ciprofloxacin (CIPRO) 500 MG tablet Take 1 tablet (500 mg total) by mouth 2 (two) times daily. One po bid x 7 days Patient not taking: Reported on 07/24/2017 12/23/16   Orlie Dakin, MD  denosumab (PROLIA) 60 MG/ML SOLN injection Inject 60 mg into the skin every 6 (six) months. Administer in upper arm, thigh, or abdomen    [provider]  glycopyrrolate (ROBINUL) 1 MG tablet TAKE 1 TABLET (1 MG TOTAL) BY MOUTH 3 (THREE) TIMES DAILY. 01/26/17   Armbruster, Carlota Raspberry, MD    Family History Family History  Problem Relation Age of Onset  . Stroke Mother   . Seizures Father   . Colon cancer Neg Hx     Social History Social History   Tobacco Use  . Smoking status: Light Tobacco Smoker    Packs/day: 0.15    Years: 50.00    Pack years: 7.50    Types: Cigarettes  . Smokeless tobacco: Never  Used  . Tobacco comment: smokes 6 cigarettes daily max  Substance Use Topics  . Alcohol use: No    Alcohol/week: 0.0 oz  . Drug use: No     Allergies   Aspirin and Penicillins   Review of Systems Review of Systems  Constitutional: Negative for fever.  Gastrointestinal: Positive for abdominal pain. Negative for nausea and vomiting.  Genitourinary: Positive for difficulty urinating and hematuria.  All other systems reviewed and are negative.    Physical Exam Updated Vital Signs BP (!) 149/68 (BP Location: Right Arm)   Pulse 76   Temp 98.4 F (36.9 C) (Oral)   Resp 16   Ht 6\' 2"  (1.88 m)   Wt 49.9 kg (110 lb)   SpO2 100%   BMI 14.12 kg/m   Physical Exam  Constitutional: He is  oriented to person, place, and time. He appears well-developed and well-nourished. No distress.  HENT:  Head: Normocephalic and atraumatic.  Right Ear: External ear normal.  Left Ear: External ear normal.  Nose: Nose normal.  Eyes: Right eye exhibits no discharge. Left eye exhibits no discharge.  Neck: Neck supple.  Cardiovascular: Normal rate, regular rhythm and normal heart sounds.  Pulmonary/Chest: Effort normal and breath sounds normal.  Abdominal: Soft. There is tenderness in the suprapubic area.  Genitourinary: Penis normal. Circumcised. No penile erythema or penile tenderness. No discharge found.  Musculoskeletal: He exhibits no edema.  Neurological: He is alert and oriented to person, place, and time.  Skin: Skin is warm and dry. He is not diaphoretic.  Nursing note and vitals reviewed.    ED Treatments / Results  Labs (all labs ordered are listed, but only abnormal results are displayed) Labs Reviewed  URINALYSIS, ROUTINE W REFLEX MICROSCOPIC - Abnormal; Notable for the following components:      Result Value   Color, Urine AMBER (*)    APPearance CLOUDY (*)    Hgb urine dipstick LARGE (*)    Protein, ur 100 (*)    Leukocytes, UA LARGE (*)    RBC / HPF >50 (*)    WBC, UA >50 (*)    Bacteria, UA MANY (*)    All other components within normal limits  BASIC METABOLIC PANEL - Abnormal; Notable for the following components:   Glucose, Bld 118 (*)    All other components within normal limits  CBC WITH DIFFERENTIAL/PLATELET - Abnormal; Notable for the following components:   RBC 3.67 (*)    Hemoglobin 11.0 (*)    HCT 34.6 (*)    All other components within normal limits  URINE CULTURE    EKG None  Radiology No results found.  Procedures Procedures (including critical care time)  Medications Ordered in ED Medications  fosfomycin (MONUROL) packet 3 g (has no administration in time range)     Initial Impression / Assessment and Plan / ED Course  I have  reviewed the triage vital signs and the nursing notes.  Pertinent labs & imaging results that were available during my care of the patient were reviewed by me and considered in my medical decision making (see chart for details).     Patient symptoms are consistent with urinary retention.  He had over 400 mL's of urine come out with Foley catheter placed.  He now feels complete relief.  His urinalysis is concerning for UTI.  He has no ascending or upper tract symptoms and thus will be given a dose of fosfomycin.  His lab work is unremarkable considering his  baseline including no renal failure.  Afebrile.  Leave the Foley catheter in and he will be given a leg bag and follow-up with urology in about a week.  He is already on Flomax.  Final Clinical Impressions(s) / ED Diagnoses   Final diagnoses:  Acute urinary retention  Acute UTI (urinary tract infection)    ED Discharge Orders    None       Sherwood Gambler, MD 07/24/17 2225

## 2017-07-24 NOTE — Progress Notes (Signed)
Pt placed in hospital gown and oriented to room. Given warm blankets. Call bell in reach. ED MD at bedside at this time.

## 2017-07-24 NOTE — ED Triage Notes (Signed)
Patient had his foley cath removed at [facility]. Patient states he is unable to urinate. Patient called [facility] and was directed to come to the ED.

## 2017-07-27 LAB — URINE CULTURE: Culture: >=100000 — AB

## 2017-07-28 ENCOUNTER — Telehealth: Payer: Self-pay

## 2017-07-28 NOTE — Telephone Encounter (Signed)
Post ED Visit - Positive Culture Follow-up  Culture report reviewed by antimicrobial stewardship pharmacist:  []  Elenor Quinones, Pharm.D. []  Heide Guile, Pharm.D., BCPS AQ-ID [x]  Parks Neptune, Pharm.D., BCPS []  Alycia Rossetti, Pharm.D., BCPS []  [place], [place].D., BCPS, AAHIVP []  Legrand Como, Pharm.D., BCPS, AAHIVP []  Salome Arnt, PharmD, BCPS []  Wynell Balloon, PharmD []  Vincenza Hews, PharmD, BCPS  Positive urine culture Treated with Fosfomycin, organism sensitive to the same and no further patient follow-up is required at this time.  Genia Del 07/28/2017, 10:32 AM

## 2017-07-31 DIAGNOSIS — R339 Retention of urine, unspecified: Secondary | ICD-10-CM | POA: Diagnosis not present

## 2017-08-01 DIAGNOSIS — R339 Retention of urine, unspecified: Secondary | ICD-10-CM | POA: Diagnosis not present

## 2017-08-01 DIAGNOSIS — N401 Enlarged prostate with lower urinary tract symptoms: Secondary | ICD-10-CM | POA: Diagnosis not present

## 2017-08-02 ENCOUNTER — Emergency Department (HOSPITAL_BASED_OUTPATIENT_CLINIC_OR_DEPARTMENT_OTHER)
Admission: EM | Admit: 2017-08-02 | Discharge: 2017-08-02 | Disposition: A | Discharge status: Home / Self Care | Payer: Medicare Other | Attending: Emergency Medicine | Admitting: Emergency Medicine

## 2017-08-02 ENCOUNTER — Encounter (HOSPITAL_BASED_OUTPATIENT_CLINIC_OR_DEPARTMENT_OTHER): Payer: Self-pay | Admitting: Emergency Medicine

## 2017-08-02 ENCOUNTER — Other Ambulatory Visit: Payer: Self-pay

## 2017-08-02 DIAGNOSIS — Z720 Tobacco use: Secondary | ICD-10-CM | POA: Insufficient documentation

## 2017-08-02 DIAGNOSIS — Z79899 Other long term (current) drug therapy: Secondary | ICD-10-CM | POA: Diagnosis not present

## 2017-08-02 DIAGNOSIS — Z7902 Long term (current) use of antithrombotics/antiplatelets: Secondary | ICD-10-CM | POA: Insufficient documentation

## 2017-08-02 DIAGNOSIS — I10 Essential (primary) hypertension: Secondary | ICD-10-CM | POA: Diagnosis not present

## 2017-08-02 DIAGNOSIS — R339 Retention of urine, unspecified: Secondary | ICD-10-CM | POA: Diagnosis not present

## 2017-08-02 DIAGNOSIS — Z960 Presence of urogenital implants: Secondary | ICD-10-CM | POA: Diagnosis not present

## 2017-08-02 HISTORY — DX: Retention of urine, unspecified: R33.9

## 2017-08-02 LAB — URINALYSIS, ROUTINE W REFLEX MICROSCOPIC
BILIRUBIN URINE: NEGATIVE
Glucose, UA: NEGATIVE mg/dL
Ketones, ur: NEGATIVE mg/dL
Nitrite: NEGATIVE
PH: 6.5 (ref 5.0–8.0)
Protein, ur: NEGATIVE mg/dL
SPECIFIC GRAVITY, URINE: 1.01 (ref 1.005–1.030)

## 2017-08-02 LAB — URINALYSIS, MICROSCOPIC (REFLEX)

## 2017-08-02 NOTE — ED Provider Notes (Signed)
[place] EMERGENCY DEPARTMENT Provider Note   CSN: [id number] Arrival date & time: 08/02/17  6834     History   Chief Complaint Chief Complaint  Patient presents with  . Urinary Retention    HPI Rick [name] is a 84 y.o. male.  The history is provided by the patient. No language interpreter was used.    Rick [name] is a 84 y.o. male who presents to the Emergency Department complaining of urinary retention.  He has a hx/o urinary retention that began 3-4 weeks ago.  He had a catheter removed one and a half days ago (had been in place 7 days).  Yesterday he was able to void without difficulty.  This morning when he woke he felt like he needed to urinate and his bladder was about to burst.  He was only able to void a small dribble.  Denies fevers, nausea, vomiting, weakness, numbness, constipation, diarrhea, back pain.  Sxs are moderate and constant in nature.    Past Medical History:  Diagnosis Date  . BPH (benign prostatic hyperplasia)   . Carotid artery occlusion   . Chronic ear infection    left  . Hx of colonic polyps   . Hyperlipidemia   . Hypertension   . Kidney stones   . Osteoporosis   . Urinary retention     Patient Active Problem List   Diagnosis Date Noted  . Medicare annual wellness visit, subsequent 03/22/2016  . Carotid bruit 10/10/2010  . Preventative health care 10/10/2010  . RIB PAIN, RIGHT SIDED 10/11/2009  . UTI 09/28/2009  . TOBACCO USER 09/23/2008  . OSTEOPENIA 05/10/2008  . BACK PAIN, ACUTE 05/08/2008  . KNEE SPRAIN, LEFT, MEDIAL COLLATERAL LIGAMENT 01/02/2007  . TESTOSTERONE DEFICIENCY 10/02/2006  . Hyperlipidemia 08/01/2006  . ERECTILE DYSFUNCTION 08/01/2006  . BENIGN PROSTATIC HYPERTROPHY, HX OF 08/01/2006  . Essential hypertension 07/27/2006  . Carotid stenosis 07/27/2006  . MICROSCOPIC HEMATURIA 07/27/2006  . Osteoporosis 07/27/2006  . COLONIC POLYPS, HX OF 07/27/2006    Past Surgical History:  Procedure  Laterality Date  . TONSILLECTOMY          Home Medications    Prior to Admission medications   Medication Sig Start Date End Date Taking? Authorizing Provider  atorvastatin (LIPITOR) 20 MG tablet Take 1 tablet (20 mg total) by mouth daily. 02/28/17   Ann Held, DO  ciprofloxacin (CIPRO) 500 MG tablet Take 1 tablet (500 mg total) by mouth 2 (two) times daily. One po bid x 7 days Patient not taking: Reported on 07/24/2017 12/23/16   Orlie Dakin, MD  clopidogrel (PLAVIX) 75 MG tablet TAKE 1 TABLET BY MOUTH EVERY DAY 12/01/16   Carollee Herter, Alferd Apa, DO  denosumab (PROLIA) 60 MG/ML SOLN injection Inject 60 mg into the skin every 6 (six) months. Administer in upper arm, thigh, or abdomen    [provider]  glycopyrrolate (ROBINUL) 1 MG tablet TAKE 1 TABLET (1 MG TOTAL) BY MOUTH 3 (THREE) TIMES DAILY. 01/26/17   Armbruster, Carlota Raspberry, MD  omeprazole (PRILOSEC) 40 MG capsule Take 1 capsule (40 mg total) 2 (two) times daily by mouth. 01/04/17   Ann Held, DO  tamsulosin (FLOMAX) 0.4 MG CAPS capsule Take 1 capsule (0.4 mg total) by mouth daily. 12/18/16   Dorie Rank, MD    Family History Family History  Problem Relation Age of Onset  . Stroke Mother   . Seizures Father   . Colon cancer Neg Hx  Social History Social History   Tobacco Use  . Smoking status: Light Tobacco Smoker    Packs/day: 0.15    Years: 50.00    Pack years: 7.50    Types: Cigarettes  . Smokeless tobacco: Never Used  . Tobacco comment: smokes 6 cigarettes daily max  Substance Use Topics  . Alcohol use: No    Alcohol/week: 0.0 oz  . Drug use: No     Allergies   Aspirin and Penicillins   Review of Systems Review of Systems  All other systems reviewed and are negative.    Physical Exam Updated Vital Signs BP (!) 157/87   Pulse 97   Temp 97.6 F (36.4 C)   Resp 16   Ht 6\' 2"  (1.88 m)   Wt 49.9 kg (110 lb)   SpO2 100%   BMI 14.12 kg/m   Physical Exam    Constitutional: He is oriented to person, place, and time. He appears well-developed and well-nourished.  HENT:  Head: Normocephalic and atraumatic.  Cardiovascular: Normal rate and regular rhythm.  No murmur heard. Pulmonary/Chest: Effort normal and breath sounds normal. No respiratory distress.  Abdominal: Soft. There is no tenderness. There is no rebound and no guarding.  Genitourinary:  Genitourinary Comments: circumcised penis.  16 french foley catheter in place  Musculoskeletal: He exhibits no edema or tenderness.  Neurological: He is alert and oriented to person, place, and time.  Skin: Skin is warm and dry.  Psychiatric: He has a normal mood and affect. His behavior is normal.  Nursing note and vitals reviewed.    ED Treatments / Results  Labs (all labs ordered are listed, but only abnormal results are displayed) Labs Reviewed  URINALYSIS, ROUTINE W REFLEX MICROSCOPIC - Abnormal; Notable for the following components:      Result Value   APPearance CLOUDY (*)    Hgb urine dipstick LARGE (*)    Leukocytes, UA LARGE (*)    All other components within normal limits  URINALYSIS, MICROSCOPIC (REFLEX) - Abnormal; Notable for the following components:   Bacteria, UA MANY (*)    All other components within normal limits  URINE CULTURE    EKG None  Radiology No results found.  Procedures Procedures (including critical care time)  Medications Ordered in ED Medications - No data to display   Initial Impression / Assessment and Plan / ED Course  I have reviewed the triage vital signs and the nursing notes.  Pertinent labs & imaging results that were available during my care of the patient were reviewed by me and considered in my medical decision making (see chart for details).    patient with history of recent urinary retention here with recurrent symptoms since waking this morning. Sixteen french foley catheter placed on ED arrival. He had 700 ML's of urine returned.  Initial urine golden in color, became blood-tinged without gross clots. Patient significantly improved following placement of catheter with resolution of his abdominal cramping. UA with many RBCs, WBCs and bacteria present, current presentation is not consistent with urinary tract infection. Culture was sent only for surveillance purposes if patient does develop symptoms. Would not treat with antibiotics unless patient becomes symptomatic. Discussed with patient Foley catheter care as well as importance of close urology follow-up. Return precautions discussed. Final Clinical Impressions(s) / ED Diagnoses   Final diagnoses:  Urinary retention    ED Discharge Orders    None       Quintella Reichert, MD 08/02/17 [phone]

## 2017-08-02 NOTE — Discharge Instructions (Addendum)
Get rechecked immediately if you develop severe pain, fevers, or new concerning symptoms.

## 2017-08-02 NOTE — ED Triage Notes (Signed)
Pt having urinary retention and pain.  No voiding since last night.  Had catheter removed 2 days ago.Marland Kitchen

## 2017-08-04 LAB — URINE CULTURE

## 2017-08-05 ENCOUNTER — Telehealth: Payer: Self-pay

## 2017-08-05 NOTE — Telephone Encounter (Signed)
No treatment for UC from ED 08/02/17 per Glendell Docker NP

## 2017-08-17 DIAGNOSIS — R339 Retention of urine, unspecified: Secondary | ICD-10-CM | POA: Diagnosis not present

## 2017-08-22 ENCOUNTER — Other Ambulatory Visit: Payer: Self-pay | Admitting: Urology

## 2017-08-31 ENCOUNTER — Telehealth: Payer: Self-pay | Admitting: *Deleted

## 2017-08-31 NOTE — Telephone Encounter (Signed)
Received Medical/Surgical Clearance Form from [facility]; forwarded to provider/SLS 07/05

## 2017-09-07 ENCOUNTER — Telehealth: Payer: Self-pay | Admitting: Family Medicine

## 2017-09-07 ENCOUNTER — Telehealth: Payer: Self-pay

## 2017-09-07 NOTE — Telephone Encounter (Signed)
Author phoned pt. to follow up on surgical clearance for TURP scheduled for 7/22, and question whether or not pt. followed up with vascular surgery per Dr. Nonda Lou request. Author left detailed VM, asking pt. to return phone call to set up appointment with Dr. Etter Sjogren early next week for clearance. Awaiting call back [phone]

## 2017-09-07 NOTE — Telephone Encounter (Signed)
Copied from [place] [phone]. Topic: Quick Communication - See Telephone Encounter >> Sep 07, 2017 12:07 PM Genella Rife H wrote: CRM for notification. See Telephone encounter for: 09/07/17.  Called patient and left voicemail he needs to schedule a surgical clearance appt (30 min) with Dr. Etter Sjogren. July 16th @4pm  is what I left on his message. His surgery is July 22nd. So he needs appt at least 5 days before surgery.

## 2017-09-10 ENCOUNTER — Other Ambulatory Visit: Payer: Self-pay | Admitting: Family Medicine

## 2017-09-11 ENCOUNTER — Other Ambulatory Visit: Payer: Self-pay | Admitting: Family Medicine

## 2017-09-11 DIAGNOSIS — R339 Retention of urine, unspecified: Secondary | ICD-10-CM | POA: Diagnosis not present

## 2017-09-11 DIAGNOSIS — I6529 Occlusion and stenosis of unspecified carotid artery: Secondary | ICD-10-CM

## 2017-09-12 NOTE — Progress Notes (Signed)
CXR 01-04-17 Epic

## 2017-09-12 NOTE — Telephone Encounter (Signed)
Called patient yesterday to let him know that Dr. Etter Sjogren wants him to follow up with vascular to clear him for surgery.  He was very confused about what I was telling him.    Called this morning to [facility] and spoke with Jenny Reichmann to explained to her what Dr. Etter Sjogren would like to have happened before surgery and she said that she was going to advised the doctor of this and called patient.  Called vascular to get appointment set up asap.  They are going to call patient and get him in on Friday.    Called [facility] and spoke with Jenny Reichmann again and she stated that the patient was really confused about everything .  I also advised that that

## 2017-09-12 NOTE — Patient Instructions (Signed)
Rick [name]  09/12/2017   Your procedure is scheduled on: 09-17-17   Report to [facility] Main  Entrance    Report to admitting at 11:30AM    Call this number if you have problems the morning of surgery [phone]     Remember: Do not eat food After Midnight. YOU MAY HAVE CLEAR LIQUIDS FROM MIDNIGHT UNTIL 7:30AM DAY OF SURGERY. NOTHING AFTER 7:30AM!     Take these medicines the morning of surgery with A SIP OF WATER: ATORVASTATIN, OMEPRAZOLE, TAMSULOSIN                                 You may not have any metal on your body including hair pins and              piercings  Do not wear jewelry, make-up, lotions, powders or perfumes, deodorant              Men may shave face and neck.   Do not bring valuables to the hospital. [place].  Contacts, dentures or bridgework may not be worn into surgery.  Leave suitcase in the car. After surgery it may be brought to your room.                  Please read over the following fact sheets you were given: _____________________________________________________________________              CLEAR LIQUID DIET   Foods Allowed                                                                     Foods Excluded  Coffee and tea, regular and decaf                             liquids that you cannot  Plain Jell-O in any flavor                                             see through such as: Fruit ices (not with fruit pulp)                                     milk, soups, orange juice  Iced Popsicles                                    All solid food Carbonated beverages, regular and diet                                    Cranberry, grape and apple juices Sports drinks like Gatorade Lightly seasoned clear  broth or consume(fat free) Sugar, honey syrup  Sample Menu Breakfast                                Lunch                                     Supper Cranberry  juice                    Beef broth                            Chicken broth Jell-O                                     Grape juice                           Apple juice Coffee or tea                        Jell-O                                      Popsicle                                                Coffee or tea                        Coffee or tea  _____________________________________________________________________  [facility] - Preparing for Surgery Before surgery, you can play an important role.  Because skin is not sterile, your skin needs to be as free of germs as possible.  You can reduce the number of germs on your skin by washing with CHG (chlorahexidine gluconate) soap before surgery.  CHG is an antiseptic cleaner which kills germs and bonds with the skin to continue killing germs even after washing. Please DO NOT use if you have an allergy to CHG or antibacterial soaps.  If your skin becomes reddened/irritated stop using the CHG and inform your nurse when you arrive at Short Stay. Do not shave (including legs and underarms) for at least 48 hours prior to the first CHG shower.  You may shave your face/neck. Please follow these instructions carefully:  1.  Shower with CHG Soap the night before surgery and the  morning of Surgery.  2.  If you choose to wash your hair, wash your hair first as usual with your  normal  shampoo.  3.  After you shampoo, rinse your hair and body thoroughly to remove the  shampoo.                           4.  Use CHG as you would any other liquid soap.  You can apply chg directly  to the skin and wash  Gently with a scrungie or clean washcloth.  5.  Apply the CHG Soap to your body ONLY FROM THE NECK DOWN.   Do not use on face/ open                           Wound or open sores. Avoid contact with eyes, ears mouth and genitals (private parts).                       Wash face,  Genitals (private parts) with your normal soap.              6.  Wash thoroughly, paying special attention to the area where your surgery  will be performed.  7.  Thoroughly rinse your body with warm water from the neck down.  8.  DO NOT shower/wash with your normal soap after using and rinsing off  the CHG Soap.                9.  Pat yourself dry with a clean towel.            10.  Wear clean pajamas.            11.  Place clean sheets on your bed the night of your first shower and do not  sleep with pets. Day of Surgery : Do not apply any lotions/deodorants the morning of surgery.  Please wear clean clothes to the hospital/surgery center.  FAILURE TO FOLLOW THESE INSTRUCTIONS MAY RESULT IN THE CANCELLATION OF YOUR SURGERY PATIENT SIGNATURE_________________________________  NURSE SIGNATURE__________________________________  ________________________________________________________________________

## 2017-09-13 ENCOUNTER — Inpatient Hospital Stay (HOSPITAL_COMMUNITY)
Admission: RE | Admit: 2017-09-13 | Discharge: 2017-09-13 | Disposition: A | Discharge status: Home / Self Care | Payer: Federal, State, Local not specified - PPO | Source: Ambulatory Visit

## 2017-09-19 ENCOUNTER — Telehealth: Payer: Self-pay | Admitting: *Deleted

## 2017-09-19 ENCOUNTER — Other Ambulatory Visit (HOSPITAL_COMMUNITY): Payer: Federal, State, Local not specified - PPO

## 2017-09-19 NOTE — Telephone Encounter (Signed)
Received call from the [facility] stating pt is on the phone and thinks he has an appt in [facility] today for an ultrasound and he needs their phone number. I do not find any appts in [facility] system scheduled for today. Gave [facility] # to vascular surgeon that he was recently referred to so he can check and see if the appt was with them.

## 2017-09-20 DIAGNOSIS — I6523 Occlusion and stenosis of bilateral carotid arteries: Secondary | ICD-10-CM | POA: Diagnosis not present

## 2017-09-21 ENCOUNTER — Telehealth: Payer: Self-pay

## 2017-09-21 NOTE — Telephone Encounter (Signed)
Pt needs his app with vascular surgery at [facility]--- they need to do clearance  See previous notes

## 2017-09-21 NOTE — Telephone Encounter (Signed)
Author phoned pt. To make an OV appointment with Dr. Etter Sjogren prior to scheduled 8/12 TURP. Author explained it was for surgical clearance to make sure he is stable hemodynamically before surgery, and pt. Verbalized understanding. OV made for 7/30 at 230PM.

## 2017-09-21 NOTE — Telephone Encounter (Signed)
Author phoned vascular surgery at [facility] and was told that pt. had been seen by vascular surgery 7/25. Receptionist stated she would fax the records from the surgeon to our office at [phone]. Appointment for 7/30 with Dr. Etter Sjogren kept as pt. has not had an OV with PCP since 12/2016, as well as to avoid any further confusion of patient.

## 2017-09-25 ENCOUNTER — Encounter: Payer: Self-pay | Admitting: Family Medicine

## 2017-09-25 ENCOUNTER — Encounter (HOSPITAL_COMMUNITY): Payer: Self-pay

## 2017-09-25 ENCOUNTER — Ambulatory Visit (INDEPENDENT_AMBULATORY_CARE_PROVIDER_SITE_OTHER): Payer: Medicare Other | Admitting: Family Medicine

## 2017-09-25 DIAGNOSIS — E785 Hyperlipidemia, unspecified: Secondary | ICD-10-CM

## 2017-09-25 DIAGNOSIS — I6529 Occlusion and stenosis of unspecified carotid artery: Secondary | ICD-10-CM | POA: Diagnosis not present

## 2017-09-25 DIAGNOSIS — N401 Enlarged prostate with lower urinary tract symptoms: Secondary | ICD-10-CM

## 2017-09-25 DIAGNOSIS — N138 Other obstructive and reflux uropathy: Secondary | ICD-10-CM

## 2017-09-25 DIAGNOSIS — I10 Essential (primary) hypertension: Secondary | ICD-10-CM

## 2017-09-25 DIAGNOSIS — M810 Age-related osteoporosis without current pathological fracture: Secondary | ICD-10-CM | POA: Diagnosis not present

## 2017-09-25 NOTE — Patient Instructions (Signed)
Your procedure is scheduled on: Monday, Aug. 12, 2019   Surgery Time:  1:30PM-3:30PM   Report to [place]  Entrance    Report to admitting at 11:30 AM   Call this number if you have problems the morning of surgery [phone]   Do not eat food:After Midnight.   Do NOT smoke after Midnight   May have liquids until 7:30AM day of surgery      CLEAR LIQUID DIET   Foods Allowed                                                                     Foods Excluded  Coffee and tea, regular and decaf                             liquids that you cannot  Plain Jell-O in any flavor                                             see through such as: Fruit ices (not with fruit pulp)                                     milk, soups, orange juice  Iced Popsicles                                    All solid food Carbonated beverages, regular and diet                                    Cranberry, grape and apple juices Sports drinks like Gatorade Lightly seasoned clear broth or consume(fat free) Sugar, honey syrup  Sample Menu Breakfast                                Lunch                                     Supper Cranberry juice                    Beef broth                            Chicken broth Jell-O                                     Grape juice                           Apple juice Coffee or tea  Jell-O                                      Popsicle                                                Coffee or tea                        Coffee or tea   Take these medicines the morning of surgery with A SIP OF WATER:   Atorvastatin, Glycopyrrolate, Omeprazole, Tamsulosin                               You may not have any metal on your body including jewelry, and body piercings             Do not wear lotions, powders, perfumes/cologne, or deodorant                          Men may shave face and neck.   Do not bring valuables to the hospital.  [place].   Contacts, dentures or bridgework may not be worn into surgery.   Leave suitcase in the car. After surgery it may be brought to your room.   Special Instructions: Bring a copy of your healthcare power of attorney and living will documents         the day of surgery if you haven't scanned them in before.              Please read over the following fact sheets you were given:  [facility] - Preparing for Surgery Before surgery, you can play an important role.  Because skin is not sterile, your skin needs to be as free of germs as possible.  You can reduce the number of germs on your skin by washing with CHG (chlorahexidine gluconate) soap before surgery.  CHG is an antiseptic cleaner which kills germs and bonds with the skin to continue killing germs even after washing. Please DO NOT use if you have an allergy to CHG or antibacterial soaps.  If your skin becomes reddened/irritated stop using the CHG and inform your nurse when you arrive at Short Stay. Do not shave (including legs and underarms) for at least 48 hours prior to the first CHG shower.  You may shave your face/neck.  Please follow these instructions carefully:  1.  Shower with CHG Soap the night before surgery and the  morning of surgery.  2.  If you choose to wash your hair, wash your hair first as usual with your normal  shampoo.  3.  After you shampoo, rinse your hair and body thoroughly to remove the shampoo.                             4.  Use CHG as you would any other liquid soap.  You can apply chg directly to the skin and wash.  Gently with a scrungie or clean washcloth.  5.  Apply the CHG Soap to your body ONLY FROM THE NECK DOWN.   Do   not use on face/ open                           Wound or open sores. Avoid contact with eyes, ears mouth and   genitals (private parts).                       Wash face,  Genitals (private parts) with your normal soap.              6.  Wash thoroughly, paying special attention to the area where your    surgery  will be performed.  7.  Thoroughly rinse your body with warm water from the neck down.  8.  DO NOT shower/wash with your normal soap after using and rinsing off the CHG Soap.                9.  Pat yourself dry with a clean towel.            10.  Wear clean pajamas.            11.  Place clean sheets on your bed the night of your first shower and do not  sleep with pets. Day of Surgery : Do not apply any lotions/deodorants the morning of surgery.  Please wear clean clothes to the hospital/surgery center.  FAILURE TO FOLLOW THESE INSTRUCTIONS MAY RESULT IN THE CANCELLATION OF YOUR SURGERY  PATIENT SIGNATURE_________________________________  NURSE SIGNATURE__________________________________  ________________________________________________________________________

## 2017-09-25 NOTE — Assessment & Plan Note (Signed)
Per urology Having transurethral resection of prostate

## 2017-09-25 NOTE — Assessment & Plan Note (Signed)
On prolia Due for bone density

## 2017-09-25 NOTE — Patient Instructions (Signed)
Pt cleared for surgery with urology Stop plavix 5-7 days prior and restart as soon as possible per surgeon

## 2017-09-25 NOTE — Assessment & Plan Note (Signed)
Tolerating statin, encouraged heart healthy diet, avoid trans fats, minimize simple carbs and saturated fats. Increase exercise as tolerated Will need fasting labs

## 2017-09-25 NOTE — Assessment & Plan Note (Signed)
Well controlled, no changes to meds. Encouraged heart healthy diet such as the DASH diet and exercise as tolerated.  °

## 2017-09-25 NOTE — Assessment & Plan Note (Signed)
On plavix  Sees vascular at [facility]

## 2017-09-25 NOTE — Progress Notes (Signed)
Subjective:  I acted as a Education administrator for Dr. Curt Jews, RMA  Patient ID: Rick [name], male    DOB: 06/19/1933, 84 y.o.   MRN: [id number]  Chief Complaint  Patient presents with  . Follow-up    having prostate surgery in 2 weeks per pt    HPI  Patient is in today for a visit for surgical clearance. Wants to know if he should continue on his Plavix   Pt was seen by vascular surgeon at [facility] and he can stop his plavix 5-7 days prior to surgery and restart as soon as possible after surgery.  Pt has no new complaints today.   He still c/o hypersalivation --- he has another app with ent.  We will consider Neuro as well.     Patient Care Team: Carollee Herter, Alferd Apa, DO as PCP - General Lowella Bandy, MD as Consulting Physician (Urology) Mal Misty, MD as Consulting Physician (Vascular Surgery) Jari Pigg, MD as Consulting Physician (Dermatology) Calvert Cantor, MD as Consulting Physician (Ophthalmology)   Past Medical History:  Diagnosis Date  . Aortic atherosclerosis ([place])   . BPH (benign prostatic hyperplasia)   . Carotid artery occlusion   . Carotid artery stenosis    21-39% stenosis of RICA, 78-46% stenosis LICA  . Chronic ear infection    left  . Hematuria   . Hx of colonic polyps   . Hyperlipidemia   . Hypertension   . Kidney stones   . Osteoporosis   . Urinary retention     Past Surgical History:  Procedure Laterality Date  . COLONOSCOPY    . TONSILLECTOMY      Family History  Problem Relation Age of Onset  . Stroke Mother   . Seizures Father   . Colon cancer Neg Hx     Social History   Socioeconomic History  . Marital status: Married    Spouse name: Not on file  . Number of children: 2  . Years of education: Not on file  . Highest education level: Not on file  Occupational History  . Occupation: retired--[employer]  Social Needs  . Financial resource strain: Not on file  . Food insecurity:    Worry: Not on file    Inability: Not on file  .  Transportation needs:    Medical: Not on file    Non-medical: Not on file  Tobacco Use  . Smoking status: Light Tobacco Smoker    Packs/day: 0.15    Years: 50.00    Pack years: 7.50    Types: Cigarettes  . Smokeless tobacco: Never Used  . Tobacco comment: smokes 6 cigarettes daily max  Substance and Sexual Activity  . Alcohol use: No    Alcohol/week: 0.0 oz  . Drug use: No  . Sexual activity: Not on file  Lifestyle  . Physical activity:    Days per week: Not on file    Minutes per session: Not on file  . Stress: Not on file  Relationships  . Social connections:    Talks on phone: Not on file    Gets together: Not on file    Attends religious service: Not on file    Active member of club or organization: Not on file    Attends meetings of clubs or organizations: Not on file    Relationship status: Not on file  . Intimate partner violence:    Fear of current or ex partner: Not on file    Emotionally abused:  Not on file    Physically abused: Not on file    Forced sexual activity: Not on file  Other Topics Concern  . Not on file  Social History Narrative   Retired- [employer]   Married   Regular exercise- yes    Outpatient Medications Prior to Visit  Medication Sig Dispense Refill  . atorvastatin (LIPITOR) 20 MG tablet Take 1 tablet (20 mg total) by mouth daily. 90 tablet 1  . clopidogrel (PLAVIX) 75 MG tablet TAKE 1 TABLET BY MOUTH EVERY DAY 30 tablet 0  . denosumab (PROLIA) 60 MG/ML SOLN injection Inject 60 mg into the skin every 6 (six) months. Administer in upper arm, thigh, or abdomen    . glycopyrrolate (ROBINUL) 1 MG tablet TAKE 1 TABLET (1 MG TOTAL) BY MOUTH 3 (THREE) TIMES DAILY. 90 tablet 1  . omeprazole (PRILOSEC) 40 MG capsule Take 1 capsule (40 mg total) 2 (two) times daily by mouth. 60 capsule 1  . tamsulosin (FLOMAX) 0.4 MG CAPS capsule Take 1 capsule (0.4 mg total) by mouth daily. 30 capsule 0  . ciprofloxacin (CIPRO) 500 MG tablet Take 1 tablet (500 mg total)  by mouth 2 (two) times daily. One po bid x 7 days (Patient not taking: Reported on 07/24/2017) 14 tablet 0   No facility-administered medications prior to visit.     Allergies  Allergen Reactions  . Aspirin Hives and Itching  . Penicillins Hives and Itching    Has patient had a PCN reaction causing immediate rash, facial/tongue/throat swelling, SOB or lightheadedness with hypotension: No Has patient had a PCN reaction causing severe rash involving mucus membranes or skin necrosis: No Has patient had a PCN reaction that required hospitalization: No Has patient had a PCN reaction occurring within the last 10 years: No If all of the above answers are "NO", then may proceed with Cephalosporin use.     Review of Systems  Constitutional: Negative for chills, fever and malaise/fatigue.  HENT: Negative for congestion and hearing loss.   Eyes: Negative for discharge.  Respiratory: Negative for cough, sputum production and shortness of breath.   Cardiovascular: Negative for chest pain, palpitations and leg swelling.  Gastrointestinal: Negative for abdominal pain, blood in stool, constipation, diarrhea, heartburn, nausea and vomiting.  Genitourinary: Positive for urgency. Negative for dysuria, frequency and hematuria.  Musculoskeletal: Negative for back pain, falls and myalgias.  Skin: Negative for rash.  Neurological: Negative for dizziness, sensory change, loss of consciousness, weakness and headaches.  Endo/Heme/Allergies: Negative for environmental allergies. Does not bruise/bleed easily.  Psychiatric/Behavioral: Negative for depression and suicidal ideas. The patient is not nervous/anxious and does not have insomnia.        Objective:    Physical Exam  Constitutional: He is oriented to person, place, and time. Vital signs are normal. He appears well-developed and well-nourished. He is sleeping.  HENT:  Head: Normocephalic and atraumatic.  Mouth/Throat: Oropharynx is clear and moist.    Eyes: Pupils are equal, round, and reactive to light. EOM are normal.  Neck: Normal range of motion. Neck supple. No thyromegaly present.  Cardiovascular: Normal rate and regular rhythm.  No murmur heard. Pulmonary/Chest: Effort normal and breath sounds normal. No respiratory distress. He has no wheezes. He has no rales. He exhibits no tenderness.  Musculoskeletal: He exhibits no edema or tenderness.  Neurological: He is alert and oriented to person, place, and time.  Skin: Skin is warm and dry.  Psychiatric: He has a normal mood and affect. His behavior is  normal. Judgment and thought content normal.  Nursing note and vitals reviewed. answered questions better in person than on phone   BP (!) 102/58 (BP Location: Left Arm, Patient Position: Sitting, Cuff Size: Normal)   Pulse 74   Temp 98.1 F (36.7 C) (Oral)   Resp 18   Ht 6\' 2"  (1.88 m)   Wt 107 lb 12.8 oz (48.9 kg)   SpO2 98%   BMI 13.84 kg/m  Wt Readings from Last 3 Encounters:  09/25/17 107 lb 12.8 oz (48.9 kg)  08/02/17 110 lb (49.9 kg)  07/24/17 110 lb (49.9 kg)   BP Readings from Last 3 Encounters:  09/25/17 (!) 102/58  08/02/17 (!) 157/87  07/24/17 (!) 145/68     Immunization History  Administered Date(s) Administered  . Influenza Whole 11/28/1998, 12/25/2006, 12/09/2008, 11/14/2010, 11/20/2011, 11/20/2012  . Influenza, High Dose Seasonal PF 12/24/2016  . Influenza-Unspecified 01/11/2014, 10/29/2014, 11/28/2015  . Pneumococcal Conjugate-13 03/06/2014  . Pneumococcal Polysaccharide-23 05/22/2002  . Td 12/01/1996, 03/15/2005  . Tdap 05/31/2015  . Zoster 09/13/2005, 03/15/2006, 05/29/2006    Health Maintenance  Topic Date Due  . INFLUENZA VACCINE  09/27/2017  . DEXA SCAN  10/04/2017  . TETANUS/TDAP  05/30/2025  . PNA vac Low Risk Adult  Completed    Lab Results  Component Value Date   WBC 7.3 07/24/2017   HGB 11.0 (L) 07/24/2017   HCT 34.6 (L) 07/24/2017   PLT 239 07/24/2017   GLUCOSE 118 (H)  07/24/2017   CHOL 137 09/18/2016   TRIG 89.0 09/18/2016   HDL 49.50 09/18/2016   LDLCALC 70 09/18/2016   ALT 10 09/18/2016   AST 16 09/18/2016   NA 139 07/24/2017   K 4.2 07/24/2017   CL 103 07/24/2017   CREATININE 0.89 07/24/2017   BUN 17 07/24/2017   CO2 25 07/24/2017   TSH 0.71 04/14/2010   PSA 5.00 (H) 03/06/2014   HGBA1C 5.6 09/25/2016    Lab Results  Component Value Date   TSH 0.71 04/14/2010   Lab Results  Component Value Date   WBC 7.3 07/24/2017   HGB 11.0 (L) 07/24/2017   HCT 34.6 (L) 07/24/2017   MCV 94.3 07/24/2017   PLT 239 07/24/2017   Lab Results  Component Value Date   NA 139 07/24/2017   K 4.2 07/24/2017   CO2 25 07/24/2017   GLUCOSE 118 (H) 07/24/2017   BUN 17 07/24/2017   CREATININE 0.89 07/24/2017   BILITOT 0.3 09/18/2016   ALKPHOS 75 09/18/2016   AST 16 09/18/2016   ALT 10 09/18/2016   PROT 6.4 09/18/2016   ALBUMIN 3.8 09/18/2016   CALCIUM 8.9 07/24/2017   ANIONGAP 11 07/24/2017   GFR 93.11 09/25/2016   Lab Results  Component Value Date   CHOL 137 09/18/2016   Lab Results  Component Value Date   HDL 49.50 09/18/2016   Lab Results  Component Value Date   LDLCALC 70 09/18/2016   Lab Results  Component Value Date   TRIG 89.0 09/18/2016   Lab Results  Component Value Date   CHOLHDL 3 09/18/2016   Lab Results  Component Value Date   HGBA1C 5.6 09/25/2016         Assessment & Plan:   Problem List Items Addressed This Visit      Unprioritized   BPH with urinary obstruction    Per urology Having transurethral resection of prostate       Carotid stenosis    On plavix  Sees vascular at  [facility]      Essential hypertension    Well controlled, no changes to meds. Encouraged heart healthy diet such as the DASH diet and exercise as tolerated.       Hyperlipidemia    Tolerating statin, encouraged heart healthy diet, avoid trans fats, minimize simple carbs and saturated fats. Increase exercise as tolerated Will  need fasting labs       Osteoporosis    On prolia Due for bone density         I have discontinued Elyn Aquas. Encalada's ciprofloxacin. I am also having him maintain his denosumab, tamsulosin, omeprazole, glycopyrrolate, atorvastatin, and clopidogrel.  No orders of the defined types were placed in this encounter.   CMA served as Education administrator during this visit. History, Physical and Plan performed by medical provider. Documentation and orders reviewed and attested to.  Ann Held, DO

## 2017-09-25 NOTE — Pre-Procedure Instructions (Signed)
Surgical clearance Dr. Reginia Forts 09/20/17 in epic.

## 2017-09-26 ENCOUNTER — Other Ambulatory Visit: Payer: Self-pay

## 2017-10-02 ENCOUNTER — Other Ambulatory Visit: Payer: Self-pay

## 2017-10-02 ENCOUNTER — Encounter (HOSPITAL_COMMUNITY)
Admission: RE | Admit: 2017-10-02 | Discharge: 2017-10-02 | Disposition: A | Discharge status: Home / Self Care | Payer: Medicare Other | Source: Ambulatory Visit | Attending: Urology | Admitting: Urology

## 2017-10-02 ENCOUNTER — Encounter (HOSPITAL_COMMUNITY): Payer: Self-pay

## 2017-10-02 DIAGNOSIS — Z01812 Encounter for preprocedural laboratory examination: Secondary | ICD-10-CM | POA: Diagnosis not present

## 2017-10-02 DIAGNOSIS — Z01810 Encounter for preprocedural cardiovascular examination: Secondary | ICD-10-CM | POA: Insufficient documentation

## 2017-10-02 HISTORY — DX: Occlusion and stenosis of unspecified carotid artery: I65.29

## 2017-10-02 HISTORY — DX: Personal history of urinary calculi: Z87.442

## 2017-10-02 HISTORY — DX: Disturbances of salivary secretion: K11.7

## 2017-10-02 HISTORY — DX: Atherosclerosis of aorta: I70.0

## 2017-10-02 HISTORY — DX: Unspecified malignant neoplasm of skin, unspecified: C44.90

## 2017-10-02 HISTORY — DX: Hematuria, unspecified: R31.9

## 2017-10-02 HISTORY — DX: Gastro-esophageal reflux disease without esophagitis: K21.9

## 2017-10-02 LAB — BASIC METABOLIC PANEL
ANION GAP: 8 (ref 5–15)
BUN: 12 mg/dL (ref 8–23)
CO2: 31 mmol/L (ref 22–32)
Calcium: 9 mg/dL (ref 8.9–10.3)
Chloride: 103 mmol/L (ref 98–111)
Creatinine, Ser: 0.87 mg/dL (ref 0.61–1.24)
GFR calc Af Amer: >60 mL/min (ref >60)
Glucose, Bld: 94 mg/dL (ref 70–99)
POTASSIUM: 4 mmol/L (ref 3.5–5.1)
SODIUM: 142 mmol/L (ref 135–145)

## 2017-10-02 LAB — CBC
HEMATOCRIT: 36.2 % — AB (ref 39.0–52.0)
Hemoglobin: 11.6 g/dL — ABNORMAL LOW (ref 13.0–17.0)
MCH: 29.7 pg (ref 26.0–34.0)
MCHC: 32 g/dL (ref 30.0–36.0)
MCV: 92.8 fL (ref 78.0–100.0)
Platelets: 284 10*3/uL (ref 150–400)
RBC: 3.9 MIL/uL — ABNORMAL LOW (ref 4.22–5.81)
RDW: 15.4 % (ref 11.5–15.5)
WBC: 6.8 10*3/uL (ref 4.0–10.5)

## 2017-10-02 NOTE — Pre-Procedure Instructions (Signed)
CBC results 10/02/2017 faxed to Dr. Alyson Ingles via epic.

## 2017-10-03 NOTE — Pre-Procedure Instructions (Signed)
Dr. Roanna Banning reviewed 10/02/17 EKG results, request Mr. Rick [name] has cardiac clearance prior to proceeding with procedure 10/08/2017.  Left message with Selita at Dr. Noland Fordyce office.

## 2017-10-05 ENCOUNTER — Telehealth: Payer: Self-pay | Admitting: Cardiology

## 2017-10-05 NOTE — Pre-Procedure Instructions (Signed)
Pam from Dr. Alyson Ingles is aware and will follow up on cardiac clearance for Mr. Rasnic.

## 2017-10-05 NOTE — Telephone Encounter (Signed)
No message needed °

## 2017-10-05 NOTE — Pre-Procedure Instructions (Signed)
I spoke with [place] [facility] she works with [facility].  Mr. Hargadon was cleared for surgery only from a vascular standpoint.  The new abnormal findings on Mr. Millikan EKG 10/02/17 would need to be addressed by cardiology. I left a message with  Selita at Dr. Noland Fordyce office.

## 2017-10-05 NOTE — Pre-Procedure Instructions (Signed)
Pam left a message that Mr. Catterton has an appointment on 10/31/2017 at [facility] with Dr. Geraldo Pitter for cardiac clearance.  A copy of Mr. Schaller abnormal EKG results 10/02/2017 was faxed to Dr. Geraldo Pitter office at [phone].Confirmation of fax delivered was received.

## 2017-10-05 NOTE — Pre-Procedure Instructions (Signed)
Left message at [facility] office following up on cardiac clearance.

## 2017-10-08 ENCOUNTER — Encounter (HOSPITAL_COMMUNITY): Admission: RE | Payer: Self-pay | Source: Ambulatory Visit

## 2017-10-08 ENCOUNTER — Ambulatory Visit (HOSPITAL_COMMUNITY): Admission: RE | Admit: 2017-10-08 | Payer: Medicare Other | Source: Ambulatory Visit | Admitting: Urology

## 2017-10-08 SURGERY — TURP (TRANSURETHRAL RESECTION OF PROSTATE)
Anesthesia: General

## 2017-10-16 DIAGNOSIS — R339 Retention of urine, unspecified: Secondary | ICD-10-CM | POA: Diagnosis not present

## 2017-10-19 DIAGNOSIS — K117 Disturbances of salivary secretion: Secondary | ICD-10-CM | POA: Diagnosis not present

## 2017-10-19 DIAGNOSIS — K219 Gastro-esophageal reflux disease without esophagitis: Secondary | ICD-10-CM | POA: Diagnosis not present

## 2017-10-22 ENCOUNTER — Encounter: Payer: Self-pay | Admitting: Cardiology

## 2017-10-22 ENCOUNTER — Ambulatory Visit (INDEPENDENT_AMBULATORY_CARE_PROVIDER_SITE_OTHER): Payer: Medicare Other | Admitting: Cardiology

## 2017-10-22 VITALS — BP 140/64 | HR 67 | Ht 73.0 in | Wt 105.8 lb

## 2017-10-22 DIAGNOSIS — I452 Bifascicular block: Secondary | ICD-10-CM

## 2017-10-22 DIAGNOSIS — I739 Peripheral vascular disease, unspecified: Secondary | ICD-10-CM

## 2017-10-22 DIAGNOSIS — I6523 Occlusion and stenosis of bilateral carotid arteries: Secondary | ICD-10-CM

## 2017-10-22 DIAGNOSIS — I10 Essential (primary) hypertension: Secondary | ICD-10-CM

## 2017-10-22 NOTE — Progress Notes (Signed)
Cardiology Consultation:    Date:  10/22/2017   ID:  Rick [name], DOB 1933-04-08, MRN [id number]  PCP:  Ann Held, DO  Cardiologist:  Jenne Campus, MD   Referring MD: Carollee Herter, Alferd Apa, *   Chief Complaint  Patient presents with  . Pre-op Exam  90 surgery  History of Present Illness:    Rick [name] is a 84 y.o. male who is being seen today for the evaluation of preop evaluation for a urological procedure at the request of Ann Held, *.  He went to preop evaluation by his primary care physician.  Echocardiogram has been done and he was identified to have a bifascicular block he was referred to [place] for consultation he gets very sketchy knowledge about his medical problem he is not sure exactly what kind of surgery he is going to have his not sure what kind of anesthesia will be used.  He knows that he need to be here but is not sure why.  He denies having any chest pain tightness squeezing pressure burning chest there is no dizziness no passing out.  He does have Foley catheter for about 2 months he said however before that he was able to walk every single day long distance and have no difficulty he looks like fairly energetic man.  Again while walking quite aggressively he was asymptomatic. Denies having TIA/CVA symptoms denies having any heart trouble previously he does not recall to have any testing done before but again his knowledge about his medical problems are very limited. Still continue to smoke about 5 cigarettes a day used to smoke about three quarters of pack every single day. There is no family history of premature coronary artery disease.  Past Medical History:  Diagnosis Date  . Aortic atherosclerosis ([place])   . BPH (benign prostatic hyperplasia)   . Carotid artery occlusion   . Carotid artery stenosis    21-39% stenosis of RICA, 26-83% stenosis LICA  . Chronic ear infection    left  . Excessive salivation   . GERD  (gastroesophageal reflux disease)   . Hematuria   . History of kidney stones   . Hx of colonic polyps   . Hyperlipidemia   . Hypertension   . Kidney stones   . Osteoporosis   . Skin cancer   . Urinary retention     Past Surgical History:  Procedure Laterality Date  . COLONOSCOPY    . TONSILLECTOMY      Current Medications: Current Meds  Medication Sig  . atorvastatin (LIPITOR) 20 MG tablet Take 1 tablet (20 mg total) by mouth daily.  . clopidogrel (PLAVIX) 75 MG tablet TAKE 1 TABLET BY MOUTH EVERY DAY  . denosumab (PROLIA) 60 MG/ML SOLN injection Inject 60 mg into the skin every 6 (six) months. Administer in upper arm, thigh, or abdomen  . glycopyrrolate (ROBINUL) 1 MG tablet TAKE 1 TABLET (1 MG TOTAL) BY MOUTH 3 (THREE) TIMES DAILY.  Marland Kitchen omeprazole (PRILOSEC) 40 MG capsule Take 1 capsule (40 mg total) 2 (two) times daily by mouth.  . tamsulosin (FLOMAX) 0.4 MG CAPS capsule Take 1 capsule (0.4 mg total) by mouth daily.     Allergies:   Aspirin and Penicillins   Social History   Socioeconomic History  . Marital status: Married    Spouse name: Not on file  . Number of children: 2  . Years of education: Not on file  . Highest education level: Not  on file  Occupational History  . Occupation: retired--[employer]  Social Needs  . Financial resource strain: Not on file  . Food insecurity:    Worry: Not on file    Inability: Not on file  . Transportation needs:    Medical: Not on file    Non-medical: Not on file  Tobacco Use  . Smoking status: Light Tobacco Smoker    Packs/day: 0.15    Years: 50.00    Pack years: 7.50    Types: Cigarettes  . Smokeless tobacco: Never Used  . Tobacco comment: smokes 5 cigarettes daily max  Substance and Sexual Activity  . Alcohol use: No    Alcohol/week: 0.0 standard drinks  . Drug use: No  . Sexual activity: Not on file  Lifestyle  . Physical activity:    Days per week: Not on file    Minutes per session: Not on file  . Stress: Not on  file  Relationships  . Social connections:    Talks on phone: Not on file    Gets together: Not on file    Attends religious service: Not on file    Active member of club or organization: Not on file    Attends meetings of clubs or organizations: Not on file    Relationship status: Not on file  Other Topics Concern  . Not on file  Social History Narrative   Retired- [employer]   Married   Regular exercise- yes     Family History: The patient's family history includes Seizures in his father; Stroke in his mother. There is no history of Colon cancer. ROS:   Please see the history of present illness.    All 14 point review of systems negative except as described per history of present illness.  EKGs/Labs/Other Studies Reviewed:    The following studies were reviewed today: Carotid duplex done on 09/20/2017 showed duplex c/w 21-39% stenosis of the R ICA and 60-79% stenosis of the L ICA   EKG:  EKG is  ordered today.  The ekg ordered today demonstrates normal sinus rhythm, right bundle branch block and left anterior hemiblock.  Recent Labs: 10/02/2017: BUN 12; Creatinine, Ser 0.87; Hemoglobin 11.6; Platelets 284; Potassium 4.0; Sodium 142  Recent Lipid Panel    Component Value Date/Time   CHOL 137 09/18/2016 1456   TRIG 89.0 09/18/2016 1456   HDL 49.50 09/18/2016 1456   CHOLHDL 3 09/18/2016 1456   VLDL 17.8 09/18/2016 1456   LDLCALC 70 09/18/2016 1456    Physical Exam:    VS:  BP 140/64   Pulse 67   Ht 6\' 1"  (1.854 m)   Wt 105 lb 12.8 oz (48 kg)   SpO2 97%   BMI 13.96 kg/m     Wt Readings from Last 3 Encounters:  10/22/17 105 lb 12.8 oz (48 kg)  10/02/17 106 lb 2 oz (48.1 kg)  09/25/17 107 lb 12.8 oz (48.9 kg)     GEN:  Well nourished, well developed in no acute distress HEENT: Normal NECK: No JVD; No carotid bruits LYMPHATICS: No lymphadenopathy CARDIAC: RRR, no murmurs, no rubs, no gallops RESPIRATORY:  Clear to auscultation without rales, wheezing or rhonchi    ABDOMEN: Soft, non-tender, non-distended MUSCULOSKELETAL:  No edema; No deformity  SKIN: Warm and dry NEUROLOGIC:  Alert and oriented x 3 PSYCHIATRIC:  Normal affect   ASSESSMENT:    1. Bifascicular block   2. Peripheral vascular disease ([place])   3. Bilateral carotid artery stenosis  4. Essential hypertension    PLAN:    In order of problems listed above:  1. Preop evaluation for this gentleman with multiple medical problems.  He does have bifascicular block I will ask him to wear Holter monitor to make sure there is no worsening of this problem especially during the night.  As a part of evaluation I will ask him to have an echocardiogram to assess his left ventricular ejection fraction.  I do not think any required any ischemia evaluation since just 2 months ago he was able to walk long distances climbing stairs and have no symptoms with that with indicate some heart problem. 2. Peripheral vascular disease to be followed by a school team.  He just saw them not even 2 months ago.  He does not have critical lesions in his neck but that required frequent follow-up 6 months from last visit he is currently ultrasound need to be repeated. 3. Essential hypertension blood pressure appears to be well controlled now.  We will continue present management.  Overall gentleman for preop evaluation for prostate surgery.  Looks like to be done under general anesthesia.  We will do echocardiogram as well as monitor to make sure that there is stable for him to proceed with the surgery.  I do not think we need to do any ischemia work-up is being very active before a problem with his prostate initiated.   Medication Adjustments/Labs and Tests Ordered: Current medicines are reviewed at length with the patient today.  Concerns regarding medicines are outlined above.  Orders Placed This Encounter  Procedures  . Holter monitor - 24 hour  . EKG 12-Lead  . ECHOCARDIOGRAM COMPLETE   No orders of the defined  types were placed in this encounter.   Signed, Park Liter, MD, [facility] 10/22/2017 4:29 PM    [place]

## 2017-10-22 NOTE — Patient Instructions (Signed)
Medication Instructions:  Your physician recommends that you continue on your current medications as directed. Please refer to the Current Medication list given to you today.   Labwork: None  Testing/Procedures: You had an EKG today.   Your physician has requested that you have an echocardiogram. Echocardiography is a painless test that uses sound waves to create images of your heart. It provides your doctor with information about the size and shape of your heart and how well your heart's chambers and valves are working. This procedure takes approximately one hour. There are no restrictions for this procedure.  Your physician has recommended that you wear a holter monitor. Holter monitors are medical devices that record the heart's electrical activity. Doctors most often use these monitors to diagnose arrhythmias. Arrhythmias are problems with the speed or rhythm of the heartbeat. The monitor is a small, portable device. You can wear one while you do your normal daily activities. This is usually used to diagnose what is causing palpitations/syncope (passing out). Wear for 24 hours.   Follow-Up: Your physician recommends that you schedule a follow-up appointment in: 2 months.  If you need a refill on your cardiac medications before your next appointment, please call your pharmacy.   Thank you for choosing [facility]! Robyne Peers, RN [phone]   Echocardiogram An echocardiogram, or echocardiography, uses sound waves (ultrasound) to produce an image of your heart. The echocardiogram is simple, painless, obtained within a short period of time, and offers valuable information to your health care provider. The images from an echocardiogram can provide information such as:  Evidence of coronary artery disease (CAD).  Heart size.  Heart muscle function.  Heart valve function.  Aneurysm detection.  Evidence of a past heart attack.  Fluid buildup around the heart.  Heart  muscle thickening.  Assess heart valve function.  Tell a health care provider about:  Any allergies you have.  All medicines you are taking, including vitamins, herbs, eye drops, creams, and over-the-counter medicines.  Any problems you or family members have had with anesthetic medicines.  Any blood disorders you have.  Any surgeries you have had.  Any medical conditions you have.  Whether you are pregnant or may be pregnant. What happens before the procedure? No special preparation is needed. Eat and drink normally. What happens during the procedure?  In order to produce an image of your heart, gel will be applied to your chest and a wand-like tool (transducer) will be moved over your chest. The gel will help transmit the sound waves from the transducer. The sound waves will harmlessly bounce off your heart to allow the heart images to be captured in real-time motion. These images will then be recorded.  You may need an IV to receive a medicine that improves the quality of the pictures. What happens after the procedure? You may return to your normal schedule including diet, activities, and medicines, unless your health care provider tells you otherwise. This information is not intended to replace advice given to you by your health care provider. Make sure you discuss any questions you have with your health care provider. Document Released: 02/11/2000 Document Revised: 10/02/2015 Document Reviewed: 10/21/2012 Elsevier Interactive Patient Education  2017 [employer].    Holter Monitoring A Holter monitor is a small device that is used to detect abnormal heart rhythms. It clips to your clothing and is connected by wires to flat, sticky disks (electrodes) that attach to your chest. It is worn continuously for 24-48 hours. Follow  these instructions at home:  Wear your Holter monitor at all times, even while exercising and sleeping, for as long as directed by your health care  provider.  Make sure that the Holter monitor is safely clipped to your clothing or close to your body as recommended by your health care provider.  Do not get the monitor or wires wet.  Do not put body lotion or moisturizer on your chest.  Keep your skin clean.  Keep a diary of your daily activities, such as walking and doing chores. If you feel that your heartbeat is abnormal or that your heart is fluttering or skipping a beat: ? Record what you are doing when it happens. ? Record what time of day the symptoms occur.  Return your Holter monitor as directed by your health care provider.  Keep all follow-up visits as directed by your health care provider. This is important. Get help right away if:  You feel lightheaded or you faint.  You have trouble breathing.  You feel pain in your chest, upper arm, or jaw.  You feel sick to your stomach and your skin is pale, cool, or damp.  You heartbeat feels unusual or abnormal. This information is not intended to replace advice given to you by your health care provider. Make sure you discuss any questions you have with your health care provider. Document Released: 11/12/2003 Document Revised: 07/22/2015 Document Reviewed: 09/22/2013 Elsevier Interactive Patient Education  [employer].

## 2017-10-30 ENCOUNTER — Ambulatory Visit: Payer: Medicare Other

## 2017-10-30 ENCOUNTER — Ambulatory Visit (HOSPITAL_BASED_OUTPATIENT_CLINIC_OR_DEPARTMENT_OTHER)
Admission: RE | Admit: 2017-10-30 | Discharge: 2017-10-30 | Disposition: A | Discharge status: Home / Self Care | Payer: Medicare Other | Source: Ambulatory Visit | Attending: Cardiology | Admitting: Cardiology

## 2017-10-30 DIAGNOSIS — I10 Essential (primary) hypertension: Secondary | ICD-10-CM | POA: Diagnosis not present

## 2017-10-30 DIAGNOSIS — I739 Peripheral vascular disease, unspecified: Secondary | ICD-10-CM

## 2017-10-30 DIAGNOSIS — I452 Bifascicular block: Secondary | ICD-10-CM | POA: Insufficient documentation

## 2017-10-30 DIAGNOSIS — I700 Atherosclerosis of aorta: Secondary | ICD-10-CM | POA: Diagnosis not present

## 2017-10-30 DIAGNOSIS — E785 Hyperlipidemia, unspecified: Secondary | ICD-10-CM | POA: Insufficient documentation

## 2017-10-30 NOTE — Progress Notes (Signed)
  Echocardiogram 2D Echocardiogram has been performed.  Rick [name] T Jayni Prescher 10/30/2017, 4:21 PM

## 2017-10-31 ENCOUNTER — Ambulatory Visit: Payer: Federal, State, Local not specified - PPO | Admitting: Cardiology

## 2017-11-01 DIAGNOSIS — I10 Essential (primary) hypertension: Secondary | ICD-10-CM | POA: Diagnosis not present

## 2017-11-06 ENCOUNTER — Telehealth: Payer: Self-pay | Admitting: Family Medicine

## 2017-11-06 DIAGNOSIS — R339 Retention of urine, unspecified: Secondary | ICD-10-CM | POA: Diagnosis not present

## 2017-11-06 NOTE — Telephone Encounter (Signed)
Prolia benefits received PA not required $185 ded-met 20% Prolia-covered by secondary   Patient may owe approximately $0 OOP  Patient due after 11/04/17  Letter mailed to inform patient of benefits and to schedule

## 2017-11-15 NOTE — Telephone Encounter (Signed)
Left detailed message for patient to call back.  He has to have labs before shot, schedule shot, and let us know so we can order.

## 2017-11-16 ENCOUNTER — Telehealth: Payer: Self-pay | Admitting: Cardiology

## 2017-11-16 NOTE — Telephone Encounter (Signed)
Wants his clearance letter they faxed over on 09/12 faxed back

## 2017-11-17 ENCOUNTER — Other Ambulatory Visit: Payer: Self-pay | Admitting: Family Medicine

## 2017-11-19 NOTE — Telephone Encounter (Signed)
This has been faxed to [facility].

## 2017-11-21 ENCOUNTER — Other Ambulatory Visit: Payer: Self-pay | Admitting: Urology

## 2017-11-23 ENCOUNTER — Other Ambulatory Visit: Payer: Self-pay | Admitting: Urology

## 2017-11-30 ENCOUNTER — Other Ambulatory Visit: Payer: Self-pay | Admitting: Family Medicine

## 2017-11-30 DIAGNOSIS — Z23 Encounter for immunization: Secondary | ICD-10-CM | POA: Diagnosis not present

## 2017-12-03 NOTE — Telephone Encounter (Signed)
Author phoned pt. to make a lab appointment and schedule prolia, but pt. Did not want to do that at this time, said he would call back to do so. Routed to Dr. Etter Sjogren as Juluis Rainier.

## 2017-12-05 NOTE — Patient Instructions (Addendum)
Rick [name]  12/05/2017   Your procedure is scheduled on: 12-17-17  Report to [facility] Main  Entrance   Report to admitting at      1100AM    Call this number if you have problems the morning of surgery [phone]    Remember: Do not eat food  :After Midnight.you may have clear liquids until 0700 am nothing by mouth after 0700 am    BRUSH YOUR TEETH MORNING OF SURGERY AND RINSE YOUR MOUTH OUT, NO CHEWING GUM CANDY OR MINTS.     Take these medicines the morning of surgery with A SIP OF WATER: flomax,Omeprazole, robinul, lipitor                                  You may not have any metal on your body including hair pins and              piercings  Do not wear jewelry,, lotions, powders or perfumes, deodorant                     Men may shave face and neck.   Do not bring valuables to the hospital. [place].  Contacts, dentures or bridgework may not be worn into surgery.  Leave suitcase in the car. After surgery it may be brought to your room.               Please read over the following fact sheets you were given: _____________________________________________________________________          [facility] - Preparing for Surgery Before surgery, you can play an important role.  Because skin is not sterile, your skin needs to be as free of germs as possible.  You can reduce the number of germs on your skin by washing with CHG (chlorahexidine gluconate) soap before surgery.  CHG is an antiseptic cleaner which kills germs and bonds with the skin to continue killing germs even after washing. Please DO NOT use if you have an allergy to CHG or antibacterial soaps.  If your skin becomes reddened/irritated stop using the CHG and inform your nurse when you arrive at Short Stay. Do not shave (including legs and underarms) for at least 48 hours prior to the first CHG shower.  You may shave your face/neck. Please  follow these instructions carefully:  1.  Shower with CHG Soap the night before surgery and the  morning of Surgery.  2.  If you choose to wash your hair, wash your hair first as usual with your  normal  shampoo.  3.  After you shampoo, rinse your hair and body thoroughly to remove the  shampoo.                           4.  Use CHG as you would any other liquid soap.  You can apply chg directly  to the skin and wash                       Gently with a scrungie or clean washcloth.  5.  Apply the CHG Soap to your body ONLY FROM THE NECK DOWN.   Do  not use on face/ open                           Wound or open sores. Avoid contact with eyes, ears mouth and genitals (private parts).                       Wash face,  Genitals (private parts) with your normal soap.             6.  Wash thoroughly, paying special attention to the area where your surgery  will be performed.  7.  Thoroughly rinse your body with warm water from the neck down.  8.  DO NOT shower/wash with your normal soap after using and rinsing off  the CHG Soap.                9.  Pat yourself dry with a clean towel.            10.  Wear clean pajamas.            11.  Place clean sheets on your bed the night of your first shower and do not  sleep with pets. Day of Surgery : Do not apply any lotions/deodorants the morning of surgery.  Please wear clean clothes to the hospital/surgery center.  FAILURE TO FOLLOW THESE INSTRUCTIONS MAY RESULT IN THE CANCELLATION OF YOUR SURGERY PATIENT SIGNATURE_________________________________  NURSE SIGNATURE__________________________________  ________________________________________________________________________

## 2017-12-08 ENCOUNTER — Emergency Department (HOSPITAL_COMMUNITY)
Admission: EM | Admit: 2017-12-08 | Discharge: 2017-12-08 | Disposition: A | Discharge status: Home / Self Care | Payer: Medicare Other | Attending: Emergency Medicine | Admitting: Emergency Medicine

## 2017-12-08 ENCOUNTER — Encounter (HOSPITAL_COMMUNITY): Payer: Self-pay | Admitting: *Deleted

## 2017-12-08 DIAGNOSIS — Z79899 Other long term (current) drug therapy: Secondary | ICD-10-CM | POA: Insufficient documentation

## 2017-12-08 DIAGNOSIS — N401 Enlarged prostate with lower urinary tract symptoms: Secondary | ICD-10-CM | POA: Diagnosis not present

## 2017-12-08 DIAGNOSIS — I10 Essential (primary) hypertension: Secondary | ICD-10-CM | POA: Diagnosis not present

## 2017-12-08 DIAGNOSIS — T839XXA Unspecified complication of genitourinary prosthetic device, implant and graft, initial encounter: Secondary | ICD-10-CM

## 2017-12-08 DIAGNOSIS — R338 Other retention of urine: Secondary | ICD-10-CM | POA: Insufficient documentation

## 2017-12-08 DIAGNOSIS — F17210 Nicotine dependence, cigarettes, uncomplicated: Secondary | ICD-10-CM | POA: Diagnosis not present

## 2017-12-08 DIAGNOSIS — T83031A Leakage of indwelling urethral catheter, initial encounter: Secondary | ICD-10-CM | POA: Diagnosis not present

## 2017-12-08 DIAGNOSIS — Z436 Encounter for attention to other artificial openings of urinary tract: Secondary | ICD-10-CM | POA: Insufficient documentation

## 2017-12-08 DIAGNOSIS — Z7902 Long term (current) use of antithrombotics/antiplatelets: Secondary | ICD-10-CM | POA: Diagnosis not present

## 2017-12-08 NOTE — ED Triage Notes (Signed)
Pt reports his catheter drainage bag has been leaking since yesterday. Pt denies other complaints.

## 2017-12-08 NOTE — ED Provider Notes (Signed)
[facility] DEPT Provider Note   CSN: [id number] Arrival date & time: 12/08/17  1414     History   Chief Complaint Chief Complaint  Patient presents with  . foley catheter bag leaking    HPI Rick [name] is a 84 y.o. male.  HPI   84 year old male with history above presents with concern for lead to Foley bag.  Patient reports that he noted urine leaking from the bag since yesterday.  Denies abdominal pain, flank pain, nausea, vomiting, fevers.  Reports a Foley catheter was placed 1 month ago.  He denies to have a procedure done with urology, with preop visit next week.  Past Medical History:  Diagnosis Date  . Aortic atherosclerosis ([place])   . BPH (benign prostatic hyperplasia)   . Carotid artery occlusion   . Carotid artery stenosis    21-39% stenosis of RICA, 40-98% stenosis LICA  . Chronic ear infection    left  . Excessive salivation   . GERD (gastroesophageal reflux disease)   . Hematuria   . History of kidney stones   . Hx of colonic polyps   . Hyperlipidemia   . Hypertension   . Kidney stones   . Osteoporosis   . Skin cancer   . Urinary retention     Patient Active Problem List   Diagnosis Date Noted  . Bifascicular block 10/22/2017  . Medicare annual wellness visit, subsequent 03/22/2016  . Carotid bruit 10/10/2010  . Preventative health care 10/10/2010  . RIB PAIN, RIGHT SIDED 10/11/2009  . UTI 09/28/2009  . TOBACCO USER 09/23/2008  . OSTEOPENIA 05/10/2008  . BACK PAIN, ACUTE 05/08/2008  . KNEE SPRAIN, LEFT, MEDIAL COLLATERAL LIGAMENT 01/02/2007  . TESTOSTERONE DEFICIENCY 10/02/2006  . Hyperlipidemia 08/01/2006  . ERECTILE DYSFUNCTION 08/01/2006  . BPH with urinary obstruction 08/01/2006  . Essential hypertension 07/27/2006  . Carotid stenosis 07/27/2006  . MICROSCOPIC HEMATURIA 07/27/2006  . Osteoporosis 07/27/2006  . COLONIC POLYPS, HX OF 07/27/2006    Past Surgical History:  Procedure Laterality Date  .  COLONOSCOPY    . TONSILLECTOMY          Home Medications    Prior to Admission medications   Medication Sig Start Date End Date Taking? Authorizing Provider  atorvastatin (LIPITOR) 20 MG tablet Take 1 tablet (20 mg total) by mouth daily. 11/19/17   Ann Held, DO  clopidogrel (PLAVIX) 75 MG tablet TAKE 1 TABLET BY MOUTH EVERY DAY Patient taking differently: Take 75 mg by mouth daily.  11/30/17   Ann Held, DO  denosumab (PROLIA) 60 MG/ML SOLN injection Inject 60 mg into the skin every 6 (six) months. Administer in upper arm, thigh, or abdomen    [provider]  glycopyrrolate (ROBINUL) 1 MG tablet TAKE 1 TABLET (1 MG TOTAL) BY MOUTH 3 (THREE) TIMES DAILY. Patient taking differently: Take 2 mg by mouth 2 (two) times daily.  01/26/17   Armbruster, Carlota Raspberry, MD  omeprazole (PRILOSEC) 40 MG capsule Take 1 capsule (40 mg total) 2 (two) times daily by mouth. 01/04/17   Ann Held, DO  tamsulosin (FLOMAX) 0.4 MG CAPS capsule Take 1 capsule (0.4 mg total) by mouth daily. 12/18/16   Dorie Rank, MD    Family History Family History  Problem Relation Age of Onset  . Stroke Mother   . Seizures Father   . Colon cancer Neg Hx     Social History Social History   Tobacco Use  .  Smoking status: Light Tobacco Smoker    Packs/day: 0.15    Years: 50.00    Pack years: 7.50    Types: Cigarettes  . Smokeless tobacco: Never Used  . Tobacco comment: smokes 5 cigarettes daily max  Substance Use Topics  . Alcohol use: No    Alcohol/week: 0.0 standard drinks  . Drug use: No     Allergies   Aspirin and Penicillins   Review of Systems Review of Systems  Constitutional: Negative for fever.  HENT: Negative for sore throat.   Gastrointestinal: Negative for abdominal pain, nausea and vomiting.  Genitourinary: Negative for difficulty urinating and flank pain.  Musculoskeletal: Negative for back pain and neck stiffness.  Neurological: Negative for syncope  and headaches.     Physical Exam Updated Vital Signs BP 128/71 (BP Location: Right Arm)   Pulse 68   Temp 97.6 F (36.4 C) (Oral)   Resp 18   SpO2 100%   Physical Exam  Constitutional: He appears well-developed. No distress.  HENT:  Head: Normocephalic.  Cardiovascular: Normal rate.  Pulmonary/Chest: Effort normal. No respiratory distress.  Abdominal: Soft. There is no tenderness.  Genitourinary:  Genitourinary Comments: Catheter in place, bag with leak, small amount of urine present   Neurological: He is alert.  Skin: He is not diaphoretic.     ED Treatments / Results  Labs (all labs ordered are listed, but only abnormal results are displayed) Labs Reviewed - No data to display  EKG None  Radiology No results found.  Procedures Procedures (including critical care time)  Medications Ordered in ED Medications - No data to display   Initial Impression / Assessment and Plan / ED Course  I have reviewed the triage vital signs and the nursing notes.  Pertinent labs & imaging results that were available during my care of the patient were reviewed by me and considered in my medical decision making (see chart for details).     84 year old male with history above presents with concern for leaking from the Foley bag.  No other concerns. Catheter placed one month ago, given this will replace catheter and bag. Pt to follow up with urology.  Final Clinical Impressions(s) / ED Diagnoses   Final diagnoses:  Problem with Foley catheter, initial encounter [facility])    ED Discharge Orders    None       Gareth Morgan, MD 12/08/17 2000

## 2017-12-11 ENCOUNTER — Encounter (HOSPITAL_COMMUNITY)
Admission: RE | Admit: 2017-12-11 | Discharge: 2017-12-11 | Disposition: A | Discharge status: Home / Self Care | Payer: Medicare Other | Source: Ambulatory Visit | Attending: Urology | Admitting: Urology

## 2017-12-11 ENCOUNTER — Other Ambulatory Visit: Payer: Self-pay

## 2017-12-11 ENCOUNTER — Encounter (HOSPITAL_COMMUNITY): Payer: Self-pay

## 2017-12-11 DIAGNOSIS — Z01812 Encounter for preprocedural laboratory examination: Secondary | ICD-10-CM | POA: Diagnosis not present

## 2017-12-11 HISTORY — DX: Anxiety disorder, unspecified: F41.9

## 2017-12-11 LAB — CBC
HEMATOCRIT: 36.4 % — AB (ref 39.0–52.0)
Hemoglobin: 11.1 g/dL — ABNORMAL LOW (ref 13.0–17.0)
MCH: 29.3 pg (ref 26.0–34.0)
MCHC: 30.5 g/dL (ref 30.0–36.0)
MCV: 96 fL (ref 80.0–100.0)
PLATELETS: 237 10*3/uL (ref 150–400)
RBC: 3.79 MIL/uL — ABNORMAL LOW (ref 4.22–5.81)
RDW: 15.9 % — AB (ref 11.5–15.5)
WBC: 9.7 10*3/uL (ref 4.0–10.5)
nRBC: 0 % (ref 0.0–0.2)

## 2017-12-11 LAB — BASIC METABOLIC PANEL
ANION GAP: 5 (ref 5–15)
BUN: 15 mg/dL (ref 8–23)
CALCIUM: 8.5 mg/dL — AB (ref 8.9–10.3)
CO2: 29 mmol/L (ref 22–32)
Chloride: 106 mmol/L (ref 98–111)
Creatinine, Ser: 0.73 mg/dL (ref 0.61–1.24)
Glucose, Bld: 197 mg/dL — ABNORMAL HIGH (ref 70–99)
Potassium: 3.5 mmol/L (ref 3.5–5.1)
Sodium: 140 mmol/L (ref 135–145)

## 2017-12-11 NOTE — Progress Notes (Signed)
LOV 10-22-17 Dr. Mauricia Area epic  Echo 10-30-17 epic   ekg 10-22-17 epic   ekg 10-02-17 epic  Holter monitor worn results in epic

## 2017-12-11 NOTE — Progress Notes (Signed)
Left chart with Tamika for follow up

## 2017-12-11 NOTE — Progress Notes (Signed)
Called selita at [facility] for any clearance letters to be faxed over  LVM.

## 2017-12-12 NOTE — Pre-Procedure Instructions (Signed)
Surgical clearance from Dr. Agustin Cree 11/08/2017 in hard chart.

## 2017-12-14 ENCOUNTER — Emergency Department (HOSPITAL_BASED_OUTPATIENT_CLINIC_OR_DEPARTMENT_OTHER)
Admission: EM | Admit: 2017-12-14 | Discharge: 2017-12-14 | Disposition: A | Discharge status: Home / Self Care | Payer: Medicare Other | Attending: Emergency Medicine | Admitting: Emergency Medicine

## 2017-12-14 ENCOUNTER — Encounter (HOSPITAL_BASED_OUTPATIENT_CLINIC_OR_DEPARTMENT_OTHER): Payer: Self-pay | Admitting: *Deleted

## 2017-12-14 ENCOUNTER — Other Ambulatory Visit: Payer: Self-pay

## 2017-12-14 DIAGNOSIS — Z7902 Long term (current) use of antithrombotics/antiplatelets: Secondary | ICD-10-CM | POA: Diagnosis not present

## 2017-12-14 DIAGNOSIS — I10 Essential (primary) hypertension: Secondary | ICD-10-CM | POA: Diagnosis not present

## 2017-12-14 DIAGNOSIS — F17210 Nicotine dependence, cigarettes, uncomplicated: Secondary | ICD-10-CM | POA: Diagnosis not present

## 2017-12-14 DIAGNOSIS — R310 Gross hematuria: Secondary | ICD-10-CM | POA: Insufficient documentation

## 2017-12-14 DIAGNOSIS — Z79899 Other long term (current) drug therapy: Secondary | ICD-10-CM | POA: Diagnosis not present

## 2017-12-14 DIAGNOSIS — Z85828 Personal history of other malignant neoplasm of skin: Secondary | ICD-10-CM | POA: Insufficient documentation

## 2017-12-14 DIAGNOSIS — R319 Hematuria, unspecified: Secondary | ICD-10-CM | POA: Diagnosis present

## 2017-12-14 LAB — URINALYSIS, ROUTINE W REFLEX MICROSCOPIC

## 2017-12-14 LAB — URINALYSIS, MICROSCOPIC (REFLEX): RBC / HPF: >50 RBC/hpf (ref 0–5)

## 2017-12-14 NOTE — Discharge Instructions (Signed)
Return if you are unable to urinate.

## 2017-12-14 NOTE — ED Provider Notes (Signed)
[facility] EMERGENCY DEPARTMENT Provider Note   CSN: [id number] Arrival date & time: 12/14/17  2038     History   Chief Complaint Chief Complaint  Patient presents with  . Hematuria    HPI Rick [name] is a 84 y.o. male.  84 yo M with a chief complaints of hematuria.  He has had this previously but it is gotten significantly worse over the past 6 hours.  He never seen and so bloody before and thought he had to come to the ED.  He denies trauma denies difficulty urinating denies abdominal pain denies nausea or vomiting denies flank pain.  He is scheduled for a procedure on his prostate on Monday.  He is concerned that this may hamper the procedure.  He denies lightheadedness or dizziness.  Denies shortness of breath.  The history is provided by the patient.  Hematuria  This is a new problem. The current episode started yesterday. The problem occurs constantly. The problem has not changed since onset.Pertinent negatives include no chest pain, no abdominal pain, no headaches and no shortness of breath. Nothing aggravates the symptoms. Nothing relieves the symptoms. He has tried nothing for the symptoms. The treatment provided no relief.    Past Medical History:  Diagnosis Date  . Anxiety    about operation  . Aortic atherosclerosis ([place])   . BPH (benign prostatic hyperplasia)   . Carotid artery occlusion   . Carotid artery stenosis    21-39% stenosis of RICA, 26-37% stenosis LICA  . Chronic ear infection    left  . Excessive salivation   . GERD (gastroesophageal reflux disease)   . Hematuria   . History of kidney stones   . Hx of colonic polyps   . Hyperlipidemia   . Hypertension    pt. denies  . Osteoporosis   . Skin cancer   . Urinary retention     Patient Active Problem List   Diagnosis Date Noted  . Bifascicular block 10/22/2017  . Medicare annual wellness visit, subsequent 03/22/2016  . Carotid bruit 10/10/2010  . Preventative health care  10/10/2010  . RIB PAIN, RIGHT SIDED 10/11/2009  . UTI 09/28/2009  . TOBACCO USER 09/23/2008  . OSTEOPENIA 05/10/2008  . BACK PAIN, ACUTE 05/08/2008  . KNEE SPRAIN, LEFT, MEDIAL COLLATERAL LIGAMENT 01/02/2007  . TESTOSTERONE DEFICIENCY 10/02/2006  . Hyperlipidemia 08/01/2006  . ERECTILE DYSFUNCTION 08/01/2006  . BPH with urinary obstruction 08/01/2006  . Essential hypertension 07/27/2006  . Carotid stenosis 07/27/2006  . MICROSCOPIC HEMATURIA 07/27/2006  . Osteoporosis 07/27/2006  . COLONIC POLYPS, HX OF 07/27/2006    Past Surgical History:  Procedure Laterality Date  . COLONOSCOPY    . TONSILLECTOMY    . TRANSURETHRAL RESECTION OF PROSTATE     12-17-17 Dr. Alyson Ingles        Home Medications    Prior to Admission medications   Medication Sig Start Date End Date Taking? Authorizing Provider  atorvastatin (LIPITOR) 20 MG tablet Take 1 tablet (20 mg total) by mouth daily. 11/19/17   Ann Held, DO  clopidogrel (PLAVIX) 75 MG tablet TAKE 1 TABLET BY MOUTH EVERY DAY Patient taking differently: Take 75 mg by mouth daily.  11/30/17   Ann Held, DO  denosumab (PROLIA) 60 MG/ML SOLN injection Inject 60 mg into the skin every 6 (six) months. Administer in upper arm, thigh, or abdomen    [provider]  glycopyrrolate (ROBINUL) 1 MG tablet TAKE 1 TABLET (1 MG TOTAL)  BY MOUTH 3 (THREE) TIMES DAILY. Patient taking differently: Take 2 mg by mouth 2 (two) times daily.  01/26/17   Armbruster, Carlota Raspberry, MD  omeprazole (PRILOSEC) 40 MG capsule Take 1 capsule (40 mg total) 2 (two) times daily by mouth. 01/04/17   Ann Held, DO  tamsulosin (FLOMAX) 0.4 MG CAPS capsule Take 1 capsule (0.4 mg total) by mouth daily. 12/18/16   Dorie Rank, MD    Family History Family History  Problem Relation Age of Onset  . Stroke Mother   . Seizures Father   . Colon cancer Neg Hx     Social History Social History   Tobacco Use  . Smoking status: Light Tobacco  Smoker    Packs/day: 0.15    Years: 50.00    Pack years: 7.50    Types: Cigarettes  . Smokeless tobacco: Never Used  . Tobacco comment: smokes 5 cigarettes daily max  Substance Use Topics  . Alcohol use: Not Currently    Alcohol/week: 0.0 standard drinks  . Drug use: No     Allergies   Aspirin and Penicillins   Review of Systems Review of Systems  Constitutional: Negative for chills and fever.  HENT: Negative for congestion and facial swelling.   Eyes: Negative for discharge and visual disturbance.  Respiratory: Negative for shortness of breath.   Cardiovascular: Negative for chest pain and palpitations.  Gastrointestinal: Negative for abdominal pain, diarrhea and vomiting.  Genitourinary: Positive for hematuria.  Musculoskeletal: Negative for arthralgias and myalgias.  Skin: Negative for color change and rash.  Neurological: Negative for tremors, syncope and headaches.  Psychiatric/Behavioral: Negative for confusion and dysphoric mood.     Physical Exam Updated Vital Signs BP (!) 146/65   Pulse 64   Temp 98.4 F (36.9 C) (Oral)   Resp 18   Ht 6\' 2"  (1.88 m)   Wt 45.8 kg   SpO2 100%   BMI 12.96 kg/m   Physical Exam  Constitutional: He is oriented to person, place, and time. He appears well-developed and well-nourished.  HENT:  Head: Normocephalic and atraumatic.  Eyes: Pupils are equal, round, and reactive to light. EOM are normal.  Neck: Normal range of motion. Neck supple. No JVD present.  Cardiovascular: Normal rate and regular rhythm. Exam reveals no gallop and no friction rub.  No murmur heard. Pulmonary/Chest: No respiratory distress. He has no wheezes.  Abdominal: He exhibits no distension and no mass. There is no tenderness. There is no rebound and no guarding.  Mild fullness to the suprapubic region.  Chronic per patient.   Genitourinary:  Genitourinary Comments: Grossly bloody urine in the foley bag.  Draining freely  Musculoskeletal: Normal range  of motion.  Neurological: He is alert and oriented to person, place, and time.  Skin: No rash noted. No pallor.  Psychiatric: He has a normal mood and affect. His behavior is normal.  Nursing note and vitals reviewed.    ED Treatments / Results  Labs (all labs ordered are listed, but only abnormal results are displayed) Labs Reviewed  URINALYSIS, ROUTINE W REFLEX MICROSCOPIC    EKG None  Radiology No results found.  Procedures Procedures (including critical care time)  Medications Ordered in ED Medications - No data to display   Initial Impression / Assessment and Plan / ED Course  I have reviewed the triage vital signs and the nursing notes.  Pertinent labs & imaging results that were available during my care of the patient were reviewed by me  and considered in my medical decision making (see chart for details).     84 yo M with a chief complaint of hematuria.  He is concerned because it is gotten more dark than normal.  He denies any systemic symptoms.  Initially denied abdominal pain and then said while he is been having some lower abdominal fullness since he had a catheter placed months ago.  I discussed the typical follow-up with urology for hematuria.  He then requested to leave prior to having his urine sample returned.    9:23 PM:  I have discussed the diagnosis/risks/treatment options with the patient and family and believe the pt to be eligible for discharge home to follow-up with Urology. We also discussed returning to the ED immediately if new or worsening sx occur. We discussed the sx which are most concerning (e.g., inability to urinate, lightheadedness, dizzy, syncope) that necessitate immediate return. Medications administered to the patient during their visit and any new prescriptions provided to the patient are listed below.  Medications given during this visit Medications - No data to display    The patient appears reasonably screen and/or stabilized for  discharge and I doubt any other medical condition or other [facility] requiring further screening, evaluation, or treatment in the ED at this time prior to discharge.    Final Clinical Impressions(s) / ED Diagnoses   Final diagnoses:  Gross hematuria    ED Discharge Orders    None       Deno Etienne, DO 12/14/17 2123

## 2017-12-14 NOTE — ED Triage Notes (Signed)
Hematuria this afternoon. States he has a foley and is scheduled to have a surgery on Monday but he does not know the name of the surgery.

## 2017-12-16 MED ORDER — GENTAMICIN SULFATE 40 MG/ML IJ SOLN
5.0000 mg/kg | INTRAVENOUS | Status: AC
  Administered 2017-12-17: 320.8 mg via INTRAVENOUS
  Filled 2017-12-16: qty 5.75

## 2017-12-17 ENCOUNTER — Observation Stay (HOSPITAL_COMMUNITY)
Admission: RE | Admit: 2017-12-17 | Discharge: 2017-12-18 | Disposition: A | Discharge status: Home / Self Care | Payer: Medicare Other | Source: Ambulatory Visit | Attending: Urology | Admitting: Urology

## 2017-12-17 ENCOUNTER — Other Ambulatory Visit: Payer: Self-pay

## 2017-12-17 ENCOUNTER — Encounter (HOSPITAL_COMMUNITY)
Admission: RE | Disposition: A | Discharge status: Home / Self Care | Payer: Self-pay | Source: Ambulatory Visit | Attending: Urology

## 2017-12-17 ENCOUNTER — Ambulatory Visit (HOSPITAL_COMMUNITY): Payer: Medicare Other | Admitting: Anesthesiology

## 2017-12-17 ENCOUNTER — Encounter (HOSPITAL_COMMUNITY): Payer: Self-pay | Admitting: *Deleted

## 2017-12-17 DIAGNOSIS — N400 Enlarged prostate without lower urinary tract symptoms: Secondary | ICD-10-CM | POA: Diagnosis not present

## 2017-12-17 DIAGNOSIS — I739 Peripheral vascular disease, unspecified: Secondary | ICD-10-CM | POA: Insufficient documentation

## 2017-12-17 DIAGNOSIS — F17210 Nicotine dependence, cigarettes, uncomplicated: Secondary | ICD-10-CM | POA: Diagnosis not present

## 2017-12-17 DIAGNOSIS — N210 Calculus in bladder: Secondary | ICD-10-CM | POA: Diagnosis not present

## 2017-12-17 DIAGNOSIS — Z79899 Other long term (current) drug therapy: Secondary | ICD-10-CM | POA: Insufficient documentation

## 2017-12-17 DIAGNOSIS — E785 Hyperlipidemia, unspecified: Secondary | ICD-10-CM | POA: Diagnosis not present

## 2017-12-17 DIAGNOSIS — I10 Essential (primary) hypertension: Secondary | ICD-10-CM | POA: Insufficient documentation

## 2017-12-17 DIAGNOSIS — K219 Gastro-esophageal reflux disease without esophagitis: Secondary | ICD-10-CM | POA: Insufficient documentation

## 2017-12-17 DIAGNOSIS — M810 Age-related osteoporosis without current pathological fracture: Secondary | ICD-10-CM | POA: Diagnosis not present

## 2017-12-17 DIAGNOSIS — N211 Calculus in urethra: Secondary | ICD-10-CM | POA: Diagnosis not present

## 2017-12-17 DIAGNOSIS — R338 Other retention of urine: Secondary | ICD-10-CM | POA: Diagnosis not present

## 2017-12-17 DIAGNOSIS — N138 Other obstructive and reflux uropathy: Secondary | ICD-10-CM

## 2017-12-17 DIAGNOSIS — N420 Calculus of prostate: Secondary | ICD-10-CM | POA: Insufficient documentation

## 2017-12-17 DIAGNOSIS — N401 Enlarged prostate with lower urinary tract symptoms: Secondary | ICD-10-CM | POA: Diagnosis not present

## 2017-12-17 DIAGNOSIS — Z85828 Personal history of other malignant neoplasm of skin: Secondary | ICD-10-CM | POA: Diagnosis not present

## 2017-12-17 DIAGNOSIS — D649 Anemia, unspecified: Secondary | ICD-10-CM | POA: Insufficient documentation

## 2017-12-17 HISTORY — PX: CYSTOSCOPY WITH LITHOLAPAXY: SHX1425

## 2017-12-17 HISTORY — PX: TRANSURETHRAL RESECTION OF PROSTATE: SHX73

## 2017-12-17 LAB — BASIC METABOLIC PANEL
Anion gap: 8 (ref 5–15)
BUN: 16 mg/dL (ref 8–23)
CHLORIDE: 106 mmol/L (ref 98–111)
CO2: 29 mmol/L (ref 22–32)
CREATININE: 0.76 mg/dL (ref 0.61–1.24)
Calcium: 8.7 mg/dL — ABNORMAL LOW (ref 8.9–10.3)
GFR calc non Af Amer: >60 mL/min (ref >60)
Glucose, Bld: 101 mg/dL — ABNORMAL HIGH (ref 70–99)
Potassium: 3.6 mmol/L (ref 3.5–5.1)
Sodium: 143 mmol/L (ref 135–145)

## 2017-12-17 LAB — CBC
HCT: 32.9 % — ABNORMAL LOW (ref 39.0–52.0)
Hemoglobin: 10 g/dL — ABNORMAL LOW (ref 13.0–17.0)
MCH: 29.4 pg (ref 26.0–34.0)
MCHC: 30.4 g/dL (ref 30.0–36.0)
MCV: 96.8 fL (ref 80.0–100.0)
Platelets: 254 10*3/uL (ref 150–400)
RBC: 3.4 MIL/uL — ABNORMAL LOW (ref 4.22–5.81)
RDW: 15.3 % (ref 11.5–15.5)
WBC: 5.9 10*3/uL (ref 4.0–10.5)
nRBC: 0 % (ref 0.0–0.2)

## 2017-12-17 SURGERY — TURP (TRANSURETHRAL RESECTION OF PROSTATE)
Anesthesia: General

## 2017-12-17 MED ORDER — DEXAMETHASONE SODIUM PHOSPHATE 10 MG/ML IJ SOLN
INTRAMUSCULAR | Status: DC | PRN
  Administered 2017-12-17: 4 mg via INTRAVENOUS

## 2017-12-17 MED ORDER — GLYCOPYRROLATE 1 MG PO TABS
1.0000 mg | ORAL_TABLET | Freq: Three times a day (TID) | ORAL | Status: DC
  Filled 2017-12-17 (×3): qty 1

## 2017-12-17 MED ORDER — ENSURE ENLIVE PO LIQD: 237.0000 mL | Freq: Two times a day (BID) | ORAL | Status: DC

## 2017-12-17 MED ORDER — FENTANYL CITRATE (PF) 100 MCG/2ML IJ SOLN: 25.0000 ug | INTRAMUSCULAR | Status: DC | PRN

## 2017-12-17 MED ORDER — OXYCODONE-ACETAMINOPHEN 5-325 MG PO TABS
1.0000 | ORAL_TABLET | ORAL | Status: DC | PRN
  Administered 2017-12-17: 1 via ORAL
  Administered 2017-12-18: 2 via ORAL
  Administered 2017-12-18: 1 via ORAL
  Filled 2017-12-17: qty 1
  Filled 2017-12-17 (×2): qty 2

## 2017-12-17 MED ORDER — ONDANSETRON HCL 4 MG/2ML IJ SOLN: 4.0000 mg | INTRAMUSCULAR | Status: DC | PRN

## 2017-12-17 MED ORDER — DIPHENHYDRAMINE HCL 50 MG/ML IJ SOLN: 12.5000 mg | Freq: Four times a day (QID) | INTRAMUSCULAR | Status: DC | PRN

## 2017-12-17 MED ORDER — ZOLPIDEM TARTRATE 5 MG PO TABS: 5.0000 mg | ORAL_TABLET | Freq: Every evening | ORAL | Status: DC | PRN

## 2017-12-17 MED ORDER — BELLADONNA ALKALOIDS-OPIUM 16.2-60 MG RE SUPP
1.0000 | Freq: Four times a day (QID) | RECTAL | Status: DC | PRN
  Administered 2017-12-18: 1 via RECTAL
  Filled 2017-12-17 (×2): qty 1

## 2017-12-17 MED ORDER — ACETAMINOPHEN 325 MG PO TABS: 650.0000 mg | ORAL_TABLET | ORAL | Status: DC | PRN

## 2017-12-17 MED ORDER — PROMETHAZINE HCL 25 MG/ML IJ SOLN: 6.2500 mg | INTRAMUSCULAR | Status: DC | PRN

## 2017-12-17 MED ORDER — SODIUM CHLORIDE 0.9 % IV SOLN
2.0000 g | INTRAVENOUS | Status: AC
  Administered 2017-12-17: 2 g via INTRAVENOUS
  Filled 2017-12-17: qty 20

## 2017-12-17 MED ORDER — FENTANYL CITRATE (PF) 100 MCG/2ML IJ SOLN
INTRAMUSCULAR | Status: DC | PRN
  Administered 2017-12-17 (×2): 50 ug via INTRAVENOUS

## 2017-12-17 MED ORDER — LACTATED RINGERS IV SOLN
INTRAVENOUS | Status: DC
  Administered 2017-12-17: 12:00:00 via INTRAVENOUS

## 2017-12-17 MED ORDER — PHENYLEPHRINE HCL 10 MG/ML IJ SOLN
INTRAMUSCULAR | Status: DC | PRN
  Administered 2017-12-17 (×2): 100 ug via INTRAVENOUS

## 2017-12-17 MED ORDER — EPHEDRINE SULFATE 50 MG/ML IJ SOLN
INTRAMUSCULAR | Status: DC | PRN
  Administered 2017-12-17 (×5): 10 mg via INTRAVENOUS

## 2017-12-17 MED ORDER — DEXAMETHASONE SODIUM PHOSPHATE 10 MG/ML IJ SOLN
INTRAMUSCULAR | Status: AC
  Filled 2017-12-17: qty 1

## 2017-12-17 MED ORDER — HYDROMORPHONE HCL 1 MG/ML IJ SOLN: 0.5000 mg | INTRAMUSCULAR | Status: DC | PRN

## 2017-12-17 MED ORDER — SODIUM CHLORIDE 0.9 % IR SOLN
Status: DC | PRN
  Administered 2017-12-17: 24000 mL

## 2017-12-17 MED ORDER — LIDOCAINE 2% (20 MG/ML) 5 ML SYRINGE
INTRAMUSCULAR | Status: AC
  Filled 2017-12-17: qty 5

## 2017-12-17 MED ORDER — ACETAMINOPHEN 10 MG/ML IV SOLN: 1000.0000 mg | Freq: Once | INTRAVENOUS | Status: DC | PRN

## 2017-12-17 MED ORDER — PROPOFOL 10 MG/ML IV BOLUS
INTRAVENOUS | Status: AC
  Filled 2017-12-17: qty 20

## 2017-12-17 MED ORDER — LIDOCAINE HCL (CARDIAC) PF 100 MG/5ML IV SOSY
PREFILLED_SYRINGE | INTRAVENOUS | Status: DC | PRN
  Administered 2017-12-17: 100 mg via INTRAVENOUS

## 2017-12-17 MED ORDER — FENTANYL CITRATE (PF) 100 MCG/2ML IJ SOLN
INTRAMUSCULAR | Status: AC
  Filled 2017-12-17: qty 2

## 2017-12-17 MED ORDER — ONDANSETRON HCL 4 MG/2ML IJ SOLN
INTRAMUSCULAR | Status: AC
  Filled 2017-12-17: qty 2

## 2017-12-17 MED ORDER — ATORVASTATIN CALCIUM 20 MG PO TABS
20.0000 mg | ORAL_TABLET | Freq: Every day | ORAL | Status: DC
  Filled 2017-12-17 (×2): qty 1

## 2017-12-17 MED ORDER — DIPHENHYDRAMINE HCL 12.5 MG/5ML PO ELIX: 12.5000 mg | ORAL_SOLUTION | Freq: Four times a day (QID) | ORAL | Status: DC | PRN

## 2017-12-17 MED ORDER — ONDANSETRON HCL 4 MG/2ML IJ SOLN
INTRAMUSCULAR | Status: DC | PRN
  Administered 2017-12-17: 4 mg via INTRAVENOUS

## 2017-12-17 MED ORDER — PROPOFOL 10 MG/ML IV BOLUS
INTRAVENOUS | Status: DC | PRN
  Administered 2017-12-17: 80 mg via INTRAVENOUS

## 2017-12-17 MED ORDER — MEPERIDINE HCL 50 MG/ML IJ SOLN: 6.2500 mg | INTRAMUSCULAR | Status: DC | PRN

## 2017-12-17 MED ORDER — 0.9 % SODIUM CHLORIDE (POUR BTL) OPTIME
TOPICAL | Status: DC | PRN
  Administered 2017-12-17: 1000 mL

## 2017-12-17 MED ORDER — STERILE WATER FOR IRRIGATION IR SOLN
Status: DC | PRN
  Administered 2017-12-17: 1000 mL

## 2017-12-17 MED ORDER — SODIUM CHLORIDE 0.9 % IR SOLN
3000.0000 mL | Status: DC
  Administered 2017-12-17 – 2017-12-18 (×3): 3000 mL

## 2017-12-17 MED ORDER — PANTOPRAZOLE SODIUM 40 MG PO TBEC
40.0000 mg | DELAYED_RELEASE_TABLET | Freq: Every day | ORAL | Status: DC
  Filled 2017-12-17: qty 1

## 2017-12-17 MED ORDER — EPHEDRINE 5 MG/ML INJ
INTRAVENOUS | Status: AC
  Filled 2017-12-17: qty 10

## 2017-12-17 MED ORDER — SODIUM CHLORIDE 0.9 % IV SOLN
INTRAVENOUS | Status: DC
  Administered 2017-12-17: 17:00:00 via INTRAVENOUS

## 2017-12-17 SURGICAL SUPPLY — 17 items
BAG URINE DRAINAGE (UROLOGICAL SUPPLIES) ×3 IMPLANT
BAG URO CATCHER STRL LF (MISCELLANEOUS) ×3 IMPLANT
CATH FOLEY 3WAY 30CC 22FR (CATHETERS) ×2 IMPLANT
COVER WAND RF STERILE (DRAPES) IMPLANT
ELECT REM PT RETURN 15FT ADLT (MISCELLANEOUS) ×3 IMPLANT
GLOVE BIOGEL M 8.0 STRL (GLOVE) ×3 IMPLANT
GOWN STRL REUS W/TWL LRG LVL3 (GOWN DISPOSABLE) ×3 IMPLANT
GOWN STRL REUS W/TWL XL LVL3 (GOWN DISPOSABLE) ×3 IMPLANT
HOLDER FOLEY CATH W/STRAP (MISCELLANEOUS) ×2 IMPLANT
LOOP CUT BIPOLAR 24F LRG (ELECTROSURGICAL) ×2 IMPLANT
MANIFOLD NEPTUNE II (INSTRUMENTS) ×3 IMPLANT
PACK CYSTO (CUSTOM PROCEDURE TRAY) ×3 IMPLANT
SYR 30ML LL (SYRINGE) ×3 IMPLANT
SYRINGE IRR TOOMEY STRL 70CC (SYRINGE) ×3 IMPLANT
TUBING CONNECTING 10 (TUBING) ×2 IMPLANT
TUBING CONNECTING 10' (TUBING) ×1
TUBING UROLOGY SET (TUBING) ×3 IMPLANT

## 2017-12-17 NOTE — Op Note (Signed)
Preoperative diagnosis: BPH  Postoperative diagnosis: BPH  Procedure: 1 cystoscopy 2. Transurethral resection of the prostate 3. cystolithalopaxy fro a stone greater than 2.5cm  Attending: Nicolette Bang  Anesthesia: General  Estimated blood loss: Minimal  Drains: 22 French foley  Specimens: 1. Prostate Chips 2. Bladder/prostate calculi  Antibiotics: Rocephin  Findings: Trilobar prostate enlargement. Ureteral orifices in normal anatomic location. 6cm of calculi in the prostatic urethra and bladder  Indications: Patient is a 84 year old male with a history of BPH and urinary retention  After discussing treatment options, they decided proceed with transurethral resection of the prostate.  Procedure her in detail: The patient was brought to the operating room and a brief timeout was done to ensure correct patient, correct procedure, correct site.  General anesthesia was administered patient was placed in dorsal lithotomy position.  Their genitalia was then prepped and draped in usual sterile fashion.  A rigid 17 French cystoscope was passed in the urethra and the bladder.  Bladder was inspected and we noted no masses or lesions.  the ureteral orifices were in the normal orthotopic locations. removed the cystoscope and placed a resectoscope into the bladder. We then turned our attention to the prostate resection. Using the bipolar resectoscope we resected the median lobe first from the bladder neck to the verumontanum. We then started at the 12 oclock position on the left lobe and resection to the 6 o'clock position from the bladder neck to the verumontanum. We then did the same resection of the right lobe. Once the resection was complete we then cauterized individual bleeders. We then removed the prostate chips and sent them for pathology.  Numerous calculi were discovered int he prostatic urethra and bladder. Using the stone crusher the stones were fragmented and then irrigated from the  bladderWe then re-inspected the prostatic fossa and found no residual bleeding.  the bladder was then drained, a 22 French foley was placed and this concluded the procedure which was well tolerated by patient.  Complications: None  Condition: Stable, extubated, transferred to PACU  Plan: Patient is admitted overnight with continuous bladder irrigation. If their urine is clear tomorrow they will be discharged home and followup in 5 days for foley catheter removal and pathology discussion.

## 2017-12-17 NOTE — H&P (Signed)
Urology Admission H&P  Chief Complaint: incomplete emptying  History of Present Illness: Mr Rick [name] is a 84yo with a hx of BPH with incomplete emptying Here for TURp. He has severe LUTS and has failed medical management. He denies any fevers/chills/sweats. No nausea/vomiting  Past Medical History:  Diagnosis Date  . Anxiety    about operation  . Aortic atherosclerosis ([place])   . BPH (benign prostatic hyperplasia)   . Carotid artery occlusion   . Carotid artery stenosis    21-39% stenosis of RICA, 22-29% stenosis LICA  . Chronic ear infection    left  . Excessive salivation   . GERD (gastroesophageal reflux disease)   . Hematuria   . History of kidney stones   . Hx of colonic polyps   . Hyperlipidemia   . Hypertension    pt. denies  . Osteoporosis   . Skin cancer   . Urinary retention    Past Surgical History:  Procedure Laterality Date  . COLONOSCOPY    . TONSILLECTOMY    . TRANSURETHRAL RESECTION OF PROSTATE     12-17-17 Dr. Alyson Ingles    Home Medications:  Current Facility-Administered Medications  Medication Dose Route Frequency Provider Last Rate Last Dose  . cefTRIAXone (ROCEPHIN) 2 g in sodium chloride 0.9 % 100 mL IVPB  2 g Intravenous 30 min Pre-Op Mirca Yale, Candee Furbish, MD      . gentamicin (GARAMYCIN) 230 mg in dextrose 5 % 100 mL IVPB  5 mg/kg Intravenous On Call to [place], MD      . lactated ringers infusion   Intravenous Continuous Catalina Gravel, MD 50 mL/hr at 12/17/17 1151     Allergies:  Allergies  Allergen Reactions  . Aspirin Hives and Itching  . Penicillins Hives, Itching and Other (See Comments)    Has patient had a PCN reaction causing immediate rash, facial/tongue/throat swelling, SOB or lightheadedness with hypotension: No Has patient had a PCN reaction causing severe rash involving mucus membranes or skin necrosis: No Has patient had a PCN reaction that required hospitalization: No Has patient had a PCN reaction occurring  within the last 10 years: No If all of the above answers are "NO", then may proceed with Cephalosporin use.     Family History  Problem Relation Age of Onset  . Stroke Mother   . Seizures Father   . Colon cancer Neg Hx    Social History:  reports that he has been smoking cigarettes. He has a 7.50 pack-year smoking history. He has never used smokeless tobacco. He reports that he drank alcohol. He reports that he does not use drugs.  Review of Systems  Genitourinary: Positive for frequency and urgency.  All other systems reviewed and are negative.   Physical Exam:  Vital signs in last 24 hours: Temp:  [97.5 F (36.4 C)] 97.5 F (36.4 C) (10/21 1147) Pulse Rate:  [54] 54 (10/21 1147) Resp:  [16] 16 (10/21 1147) BP: (140)/(71) 140/71 (10/21 1147) SpO2:  [100 %] 100 % (10/21 1147) Weight:  [45.8 kg] 45.8 kg (10/21 1147) Physical Exam  Constitutional: He is oriented to person, place, and time. He appears well-developed and well-nourished.  HENT:  Head: Normocephalic and atraumatic.  Eyes: Pupils are equal, round, and reactive to light. EOM are normal.  Neck: Normal range of motion. No thyromegaly present.  Cardiovascular: Normal rate and regular rhythm.  Respiratory: Effort normal. No respiratory distress.  GI: Soft. He exhibits no distension.  Musculoskeletal: Normal range of  motion. He exhibits no edema.  Neurological: He is alert and oriented to person, place, and time.  Skin: Skin is warm and dry.  Psychiatric: He has a normal mood and affect. His behavior is normal. Judgment and thought content normal.    Laboratory Data:  No results found for this or any previous visit (from the past 24 hour(s)). No results found for this or any previous visit (from the past 240 hour(s)). Creatinine: Recent Labs    12/11/17 1540  CREATININE 0.73   Baseline Creatinine: 0.73  Impression/Assessment:  84yo with BPH with incomplete emptying  Plan:  The risks/benefits/alternatives  to TURP was explained to the patient and he understands and wishes to proceed with surgery  Nicolette Bang 12/17/2017, 12:27 PM

## 2017-12-17 NOTE — Anesthesia Procedure Notes (Signed)
Procedure Name: LMA Insertion Date/Time: 12/17/2017 1:14 PM Performed by: Jonna Munro, CRNA Pre-anesthesia Checklist: Patient identified, Emergency Drugs available, Suction available, Patient being monitored and Timeout performed Patient Re-evaluated:Patient Re-evaluated prior to induction Oxygen Delivery Method: Circle system utilized Preoxygenation: Pre-oxygenation with 100% oxygen Induction Type: IV induction LMA: LMA inserted LMA Size: 4.0 Number of attempts: 1 Placement Confirmation: positive ETCO2 and breath sounds checked- equal and bilateral Tube secured with: Tape Dental Injury: Teeth and Oropharynx as per pre-operative assessment

## 2017-12-17 NOTE — Plan of Care (Signed)
  Problem: Health Behavior/Discharge Planning: Goal: Ability to manage health-related needs will improve Outcome: Progressing   Problem: Education: Goal: Knowledge of the prescribed therapeutic regimen will improve Outcome: Progressing   Problem: Urinary Elimination: Goal: Ability to avoid or minimize complications of infection will improve Outcome: Progressing

## 2017-12-17 NOTE — Anesthesia Preprocedure Evaluation (Addendum)
Anesthesia Evaluation  Patient identified by MRN, date of birth, ID band Patient awake    Reviewed: Allergy & Precautions, NPO status , Patient's Chart, lab work & pertinent test results  Airway Mallampati: I       Dental no notable dental hx. (+) Teeth Intact   Pulmonary Current Smoker,    Pulmonary exam normal breath sounds clear to auscultation       Cardiovascular hypertension, + Peripheral Vascular Disease   Rhythm:Regular Rate:Normal     Neuro/Psych    GI/Hepatic Neg liver ROS, GERD  Medicated,  Endo/Other  negative endocrine ROS  Renal/GU negative Renal ROS     Musculoskeletal   Abdominal Normal abdominal exam  (+)   Peds  Hematology  (+) anemia ,   Anesthesia Other Findings Rick [name]  ECHO COMPLETE WO IMAGING ENHANCING AGENT  Order# [id number]  Reading physician: Jenean Lindau, MD Ordering physician: Park Liter, MD Study date: 10/30/17 Result Notes for ECHOCARDIOGRAM COMPLETE   Notes recorded by Mardelle Matte, [place] on 11/06/2017 at 10:25 AM EDT Called and informed pt that his echo that echo was normal . ------  Notes recorded by Ashok Norris, RN on 11/06/2017 at 8:42 AM EDT Left message for patient to return call. ------  Notes recorded by Mardelle Matte, CMA on 11/05/2017 at 2:56 PM EDT Called and l/m on pt voicemail for pt call us back about his results. ------ US Carotid Bilateral (Accession [id number]) (Order [id number])  Imaging  Date: 07/19/2016 Department: [facility] [place] Released By: Katha Hamming Authorizing: Ann Held, DO Exam Information   Status Exam Begun  Exam Ended  Final [id number] 07/19/2016 4:18 PM 07/19/2016 6:01 PM PACS Images   Show images for US Carotid Bilateral Study Result   CLINICAL DATA:  History of carotid stenosis  EXAM: BILATERAL CAROTID DUPLEX ULTRASOUND  TECHNIQUE: Pearline Cables scale imaging,  color Doppler and duplex ultrasound were performed of bilateral carotid and vertebral arteries in the neck.  COMPARISON:  06/08/2014  FINDINGS: Criteria: Quantification of carotid stenosis is based on velocity parameters that correlate the residual internal carotid diameter with NASCET-based stenosis levels, using the diameter of the distal internal carotid lumen as the denominator for stenosis measurement.  The following velocity measurements were obtained:  RIGHT  ICA:  106 cm/sec  CCA:  93 cm/sec  SYSTOLIC ICA/CCA RATIO:  1.2  DIASTOLIC ICA/CCA RATIO:  2.6  ECA:  134 cm/sec  LEFT  ICA:  315 cm/sec  CCA:  423 cm/sec  SYSTOLIC ICA/CCA RATIO:  3.7  DIASTOLIC ICA/CCA RATIO:  5.3  ECA:  127 cm/sec  RIGHT CAROTID ARTERY: Mild calcified plaque in the upper common carotid. Moderate predominately soft plaque in the bulb. There is significant calcified plaque in the proximal external carotid artery. Low resistance internal carotid Doppler pattern is preserved. There is calcified plaque in the lower internal carotid artery.  RIGHT VERTEBRAL ARTERY:  Antegrade.  LEFT CAROTID ARTERY: There is moderate predominately soft plaque in the bulb. Velocities are markedly elevated. There is extensive calcified plaque in the lower internal carotid artery. Low resistance internal carotid Doppler pattern is preserved.  LEFT VERTEBRAL ARTERY:  Antegrade.  IMPRESSION: Less than 50% stenosis in the right internal carotid artery. There is plaque in the right common, internal, and external carotid artery as described above.  Peak systolic velocity in the left internal carotid artery bulb has increased since the prior study and now indicates greater than 70% stenosis. Again,  there is plaque within the left common carotid artery, bulb, and internal carotid artery.   Electronically Signed   By: Marybelle Killings M.D.   On: 07/20/2016 07:46  Result Notes for US  Carotid Bilateral   Notes recorded by Ewing, Robin B, CMA on 07/27/2016 at 9:30 AM EDT Yes ------  Notes recorded by Ann Held, DO on 07/26/2016 at 7:35 PM EDT There is no one in our building---- They are the only office in [place] -- check with [name] or Drue Dun ------  Notes recorded by Ewing, Donell Sievert, CMA on 07/26/2016 at 3:44 PM EDT Spoke to the patient informed of results/instructions. He is ok going back to vascular, but previously went to DR. Kellie Simmering on [facility]. -- he stated he does not want to go that far into [place]--would like our building or something a little closer ------  Notes recorded by Doylene Canning, CMA on 07/26/2016 at 12:02 PM EDT Left message on machine to call back  ------  Notes recorded by Damita Dunnings, [place] on 07/25/2016 at 2:33 PM EDT Forwarding to vascular. ------  Notes recorded by Ann Held, DO on 07/22/2016 at 11:25 AM EDT Please forward to Vascular surgery Stenosis has worsened-- he will need f/u with vascular surgeon   Encounter-Level Documents - 07/19/2016:   Electronic signature on 07/19/2016 4:05 PM - Signed  Electronic signature on 07/19/2016 4:05 PM - Signed  Scan on 07/19/2016 6:02 PM by Default, Provider, MDScan on 07/19/2016 6:02 PM by Default, Provider, MD    Order-Level Documents:   There are no order-level documents.  Hospital account-Level Documents:   There are no hospital account-level documents.  Vitals   Height Weight BMI (Calculated)    Imaging   Imaging Information Resulted by:   Signed Date/Time  Phone Pager Marybelle Killings 07/20/2016 7:46 AM [phone] Result Notes for US Carotid Bilateral   Notes recorded by Ewing, Robin B, CMA on 07/27/2016 at 9:30 AM EDT Yes ------  Notes recorded by Ann Held, DO on 07/26/2016 at 7:35 PM EDT There is no one in our building---- They are the only office in [place] -- check with [name] or Drue Dun ------  Notes recorded by Ewing, Donell Sievert, CMA on 07/26/2016 at 3:44 PM EDT Spoke to the patient informed of results/instructions. He is ok going back to vascular, but previously went to DR. Kellie Simmering on [facility]. -- he stated he does not want to go that far into [place]--would like our building or something a little closer ------  Notes recorded by Doylene Canning, CMA on 07/26/2016 at 12:02 PM EDT Left message on machine to call back  ------  Notes recorded by Damita Dunnings, [place] on 07/25/2016 at 2:33 PM EDT Forwarding to vascular. ------  Notes recorded by Ann Held, DO on 07/22/2016 at 11:25 AM EDT Please forward to Vascular surgery Stenosis has worsened-- he will need f/u with vascular surgeon   Study Notes    Orbie Pyo, RT on 07/19/2016 6:02 PM F/U bilateral carotid plaque  Original Order   Ordered On Ordered By  07/11/2016 4:31 PM Ann Held, DO       External Result Report   External Result Report   Notes recorded by Park Liter, MD on 11/05/2017 at 2:22 PM EDT Normal LVEF   Study Result   Result status: Final result                        *  [place]                        [place],  [address]                            [phone]  ------------------------------------------------------------------- Transthoracic Echocardiography  Patient:    Rick [name], Rick [name] MR #:       [id number] Study Date: 10/30/2017 Gender:     M Age:        26 Height:     185.4 cm Weight:     48 kg BSA:        1.54 m^2 Pt. Status: Room:   ATTENDING    Jenne Campus, MD  ORDERING     Jenne Campus, MD  REFERRING    Jenne Campus, MD  [place], [place]  SONOGRAPHER  Cardell Peach, RDCS  cc:  ------------------------------------------------------------------- LV EF: 60% -   65%  ------------------------------------------------------------------- History:   PMH:  Bifascicular  block  Peripheral vascular disease ([place])  Risk factors:  Hypertension. Dyslipidemia.  ------------------------------------------------------------------- Study Conclusions  - Left ventricle: The cavity size was normal. Systolic function was   normal. The estimated ejection fraction was in the range of 60%   to 65%. Wall motion was normal; there were no regional wall   motion abnormalities.  Impressions:  - 1. Left ventricle systolic function is preserved visually   estimated ejection fraction is 60 to 65%.   2. Aortic sclerosis.  ------------------------------------------------------------------- Study data:  No prior study was available for comparison.  Study status:  Routine.  Procedure:  Transthoracic echocardiography. Image quality was suboptimal. The study was technically difficult, as a result of restricted patient mobility and body habitus.  Study completion:  There were no complications.          Transthoracic echocardiography.  M-mode, complete 2D, spectral Doppler, and color Doppler.  Birthdate:  Patient birthdate: 04-22-1933.  Age:  Patient is 84 yr old.  Sex:  Gender: male.    BMI: 14 kg/m^2.  Blood pressure:     140/64  Patient status:  Outpatient.  Study date: Study date: 10/30/2017. Study time: 03:44 PM.  Location:  Echo laboratory.  -------------------------------------------------------------------  ------------------------------------------------------------------- Left ventricle:  The cavity size was normal. Systolic function was normal. The estimated ejection fraction was in the range of 60% to 65%. Wall motion was normal; there were no regional wall motion abnormalities.  ------------------------------------------------------------------- Aortic valve:   Trileaflet; normal thickness leaflets. Mobility was not restricted. Sclerosis without stenosis.  Doppler: Transvalvular velocity was within the normal range. There was no stenosis. There was  no regurgitation.  ------------------------------------------------------------------- Aorta:  Aortic root: The aortic root was normal in size.  ------------------------------------------------------------------- Mitral valve:   Structurally normal valve.   Mobility was not restricted.  Doppler:  Transvalvular velocity was within the normal range. There was no evidence for stenosis. There was no regurgitation.  ------------------------------------------------------------------- Left atrium:  The atrium was normal in size.  ------------------------------------------------------------------- Right ventricle:  The cavity size was normal. Wall thickness was normal. Systolic function was normal.  ------------------------------------------------------------------- Pulmonic valve:    Doppler:  Transvalvular velocity was within the normal range. There was no evidence for stenosis.  ------------------------------------------------------------------- Tricuspid valve:   Structurally normal valve.  Doppler: Transvalvular velocity was within the normal range. There was no regurgitation.  ------------------------------------------------------------------- Pulmonary artery:   The main pulmonary artery was normal-sized. Systolic pressure was within the normal range.  ------------------------------------------------------------------- Right atrium:  The atrium was normal in size.  ------------------------------------------------------------------- Pericardium:  There was no pericardial effusion.  ------------------------------------------------------------------- Systemic veins: Inferior vena cava: The vessel was normal in size.  ------------------------------------------------------------------- Measurements   Left ventricle                          Value        Reference  LV ID, ED, PLAX chordal         (L)     30.4  mm     43 - 52  LV ID, ES, PLAX chordal         (L)      18.9  mm     23 - 38  LV fx shortening, PLAX chordal          38    %      >=29  LV PW thickness, ED                     10.8  mm     ---------  IVS/LV PW ratio, ED                     1.24         <=1.3  Stroke volume, 2D                       73    ml     ---------  Stroke volume/bsa, 2D                   47    ml/m^2 ---------  LV e&', lateral                          8.35  cm/s   ---------  LV E/e&', lateral                        7.95         ---------  LV e&', medial                           7.05  cm/s   ---------  LV E/e&', medial                         9.42         ---------  LV e&', average                          7.7   cm/s   ---------  LV E/e&', average                        8.62         ---------    Ventricular septum                      Value        Reference  IVS thickness, ED  13.4  mm     ---------    LVOT                                    Value        Reference  LVOT ID, S                              19    mm     ---------  LVOT area                               2.84  cm^2   ---------  LVOT peak velocity, S                   114   cm/s   ---------  LVOT mean velocity, S                   81.9  cm/s   ---------  LVOT VTI, S                             25.6  cm     ---------  LVOT peak gradient, S                   5     mm Hg  ---------    Aorta                                   Value        Reference  Aortic root ID, ED                      27    mm     ---------    Left atrium                             Value        Reference  LA ID, A-P, ES                          18    mm     ---------  LA ID/bsa, A-P                          1.17  cm/m^2 <=2.2  LA volume, S                            18.3  ml     ---------  LA volume/bsa, S                        11.9  ml/m^2 ---------  LA volume, ES, 1-p A4C                  20.4  ml     ---------  LA volume/bsa, ES, 1-p A4C              13.2  ml/m^2 ---------  LA volume, ES, 1-p A2C  15.4  ml     ---------  LA volume/bsa, ES, 1-p A2C              10    ml/m^2 ---------    Mitral valve                            Value        Reference  Mitral E-wave peak velocity             66.4  cm/s   ---------  Mitral A-wave peak velocity             53.3  cm/s   ---------  Mitral deceleration time        (H)     282   ms     150 - 230  Mitral E/A ratio, peak                  1.2          ---------    Systemic veins                          Value        Reference  Estimated CVP                           15    mm Hg  ---------    Right ventricle                         Value        Reference  RV ID, minor axis, ED, A4C base         33.6  mm     ---------  TAPSE                                   14.3  mm     ---------  RV s&', lateral, S                       7.78  cm/s   ---------  Legend: (L)  and  (H)  mark values outside specified reference range.  ------------------------------------------------------------------- Prepared and Electronically Authenticated by  Jyl Heinz, MD 2019-09-04T15:51:05 [facility] Images   Show images for ECHOCARDIOGRAM COMPLETE Patient Information   Patient Name Rick [name], Rick [name] Sex Male DOB September 07, 1933 SSN [id number] Reason for Exam  Priority: Routine  Dx: Bifascicular block (I45.2 (ICD-10-CM)); Peripheral vascular disease ([place]) (I73.9 (ICD-10-CM)) Comments:  Surgical History   Surgical History    No past medical history on file.  Other Surgical History    Procedure Laterality Date Comment Source COLONOSCOPY    Provider TONSILLECTOMY    Provider TRANSURETHRAL RESECTION OF PROSTATE   12-17-17 Dr. Alyson Ingles Provider  Performing Technologist/Nurse   Performing Technologist/Nurse: Melvenia Beam T          Implants    No active implants to display in this view. Order-Level Documents:   There are no order-level documents.  Encounter-Level Documents - 10/30/2017:   Electronic signature on 10/30/2017 3:36 PM -  Signed  Electronic signature on 10/30/2017 3:36 PM - Signed    Signed   Electronically signed by Jenean Lindau, MD on 10/31/17 at 13 EDT Printable Result Report  Result Report  External Result Report    External Result Report     Reproductive/Obstetrics                            Anesthesia Physical Anesthesia Plan  ASA: II  Anesthesia Plan: General   Post-op Pain Management:    Induction: Intravenous  PONV Risk Score and Plan: 3 and Ondansetron and Dexamethasone  Airway Management Planned: LMA  Additional Equipment:   Intra-op Plan:   Post-operative Plan: Extubation in OR  Informed Consent: I have reviewed the patients History and Physical, chart, labs and discussed the procedure including the risks, benefits and alternatives for the proposed anesthesia with the patient or authorized representative who has indicated his/her understanding and acceptance.   Dental advisory given  Plan Discussed with:   Anesthesia Plan Comments:         Anesthesia Quick Evaluation

## 2017-12-17 NOTE — Transfer of Care (Signed)
Immediate Anesthesia Transfer of Care Note  Patient: Rick [name]  Procedure(s) Performed: TRANSURETHRAL RESECTION OF THE PROSTATE (TURP) (N/A ) CYSTOSCOPY WITH LITHOLAPAXY (N/A )  Patient Location: PACU  Anesthesia Type:General  Level of Consciousness: awake, alert  and oriented  Airway & Oxygen Therapy: Patient Spontanous Breathing and Patient connected to face mask oxygen  Post-op Assessment: Report given to RN and Post -op Vital signs reviewed and stable  Post vital signs: Reviewed and stable  Last Vitals:  Vitals Value Taken Time  BP    Temp    Pulse 75 12/17/2017  2:50 PM  Resp 16 12/17/2017  2:50 PM  SpO2 100 % 12/17/2017  2:50 PM  Vitals shown include unvalidated device data.  Last Pain:  Vitals:   12/17/17 1147  TempSrc: Oral         Complications: No apparent anesthesia complications

## 2017-12-18 ENCOUNTER — Encounter (HOSPITAL_COMMUNITY): Payer: Self-pay | Admitting: Urology

## 2017-12-18 DIAGNOSIS — K219 Gastro-esophageal reflux disease without esophagitis: Secondary | ICD-10-CM | POA: Diagnosis not present

## 2017-12-18 DIAGNOSIS — N210 Calculus in bladder: Secondary | ICD-10-CM | POA: Diagnosis not present

## 2017-12-18 DIAGNOSIS — N401 Enlarged prostate with lower urinary tract symptoms: Secondary | ICD-10-CM | POA: Diagnosis not present

## 2017-12-18 DIAGNOSIS — E785 Hyperlipidemia, unspecified: Secondary | ICD-10-CM | POA: Diagnosis not present

## 2017-12-18 DIAGNOSIS — R338 Other retention of urine: Secondary | ICD-10-CM | POA: Diagnosis not present

## 2017-12-18 DIAGNOSIS — N420 Calculus of prostate: Secondary | ICD-10-CM | POA: Diagnosis not present

## 2017-12-18 LAB — BASIC METABOLIC PANEL
ANION GAP: 3 — AB (ref 5–15)
BUN: 20 mg/dL (ref 8–23)
CALCIUM: 8.5 mg/dL — AB (ref 8.9–10.3)
CO2: 31 mmol/L (ref 22–32)
Chloride: 109 mmol/L (ref 98–111)
Creatinine, Ser: 0.8 mg/dL (ref 0.61–1.24)
GFR calc non Af Amer: >60 mL/min (ref >60)
Glucose, Bld: 100 mg/dL — ABNORMAL HIGH (ref 70–99)
Potassium: 4.6 mmol/L (ref 3.5–5.1)
Sodium: 143 mmol/L (ref 135–145)

## 2017-12-18 LAB — CBC
HCT: 28 % — ABNORMAL LOW (ref 39.0–52.0)
Hemoglobin: 8.5 g/dL — ABNORMAL LOW (ref 13.0–17.0)
MCH: 29.5 pg (ref 26.0–34.0)
MCHC: 30.4 g/dL (ref 30.0–36.0)
MCV: 97.2 fL (ref 80.0–100.0)
NRBC: 0 % (ref 0.0–0.2)
PLATELETS: 233 10*3/uL (ref 150–400)
RBC: 2.88 MIL/uL — ABNORMAL LOW (ref 4.22–5.81)
RDW: 15.4 % (ref 11.5–15.5)
WBC: 12.6 10*3/uL — AB (ref 4.0–10.5)

## 2017-12-18 MED ORDER — OXYCODONE-ACETAMINOPHEN 5-325 MG PO TABS: 1.0000 | ORAL_TABLET | ORAL | 0 refills | Status: AC | PRN

## 2017-12-18 NOTE — Progress Notes (Signed)
Pt discharged to home via private vehicle.  All belongings sent home with pt including leg bag and foley kit.  Pt verbalized understanding of discharge instructions and follow up care.  Education provided re: pain management, foley catheter care, when to call MD and follow up care.  Danton Clap, RN

## 2017-12-18 NOTE — Discharge Instructions (Signed)
Indwelling Urinary Catheter Care, Adult  Take good care of your catheter to keep it working and to prevent problems.  How to wear your catheter  Attach your catheter to your leg with tape (adhesive tape) or a leg strap. Make sure it is not too tight. If you use tape, remove any bits of tape that are already on the catheter.  How to wear a drainage bag  You should have:   A large overnight bag.   A small leg bag.    Overnight Bag  You may wear the overnight bag at any time. Always keep the bag below the level of your bladder but off the floor. When you sleep, put a clean plastic bag in a wastebasket. Then hang the bag inside the wastebasket.  Leg Bag  Never wear the leg bag at night. Always wear the leg bag below your knee. Keep the leg bag secure with a leg strap or tape.  How to care for your skin   Clean the skin around the catheter at least once every day.   Shower every day. Do not take baths.   Put creams, lotions, or ointments on your genital area only as told by your doctor.   Do not use powders, sprays, or lotions on your genital area.  How to clean your catheter and your skin  1. Wash your hands with soap and water.  2. Wet a washcloth in warm water and gentle (mild) soap.  3. Use the washcloth to clean the skin where the catheter enters your body. Clean downward and wipe away from the catheter in small circles. Do not wipe toward the catheter.  4. Pat the area dry with a clean towel. Make sure to clean off all soap.  How to care for your drainage bags  Empty your drainage bag when it is ?- full or at least 2-3 times a day. Replace your drainage bag once a month or sooner if it starts to smell bad or look dirty. Do not clean your drainage bag unless told by your doctor.  Emptying a drainage bag    Supplies Needed   Rubbing alcohol.   Gauze pad or cotton ball.   Tape or a leg strap.    Steps  1. Wash your hands with soap and water.  2. Separate (detach) the bag from your leg.  3. Hold the bag over  the toilet or a clean container. Keep the bag below your hips and bladder. This stops pee (urine) from going back into the tube.  4. Open the pour spout at the bottom of the bag.  5. Empty the pee into the toilet or container. Do not let the pour spout touch any surface.  6. Put rubbing alcohol on a gauze pad or cotton ball.  7. Use the gauze pad or cotton ball to clean the pour spout.  8. Close the pour spout.  9. Attach the bag to your leg with tape or a leg strap.  10. Wash your hands.    Changing a drainage bag  Supplies Needed   Alcohol wipes.   A clean drainage bag.   Adhesive tape or a leg strap.    Steps  1. Wash your hands with soap and water.  2. Separate the dirty bag from your leg.  3. Pinch the rubber catheter with your fingers so that pee does not spill out.  4. Separate the catheter tube from the drainage tube where these tubes connect (at the   connection valve). Do not let the tubes touch any surface.  5. Clean the end of the catheter tube with an alcohol wipe. Use a different alcohol wipe to clean the end of the drainage tube.  6. Connect the catheter tube to the drainage tube of the clean bag.  7. Attach the new bag to the leg with adhesive tape or a leg strap.  8. Wash your hands.    How to prevent infection and other problems   Never pull on your catheter or try to remove it. Pulling can damage tissue in your body.   Always wash your hands before and after touching your catheter.   If a leg strap gets wet, replace it with a dry one.   Drink enough fluids to keep your pee clear or pale yellow, or as told by your doctor.   Do not let the drainage bag or tubing touch the floor.   Wear cotton underwear.   If you are male, wipe from front to back after you poop (have a bowel movement).   Check on the catheter often to make sure it works and the tubing is not twisted.  Get help if:   Your pee is cloudy.   Your pee smells unusually bad.   Your pee is not draining into the bag.   Your  tube gets clogged.   Your catheter starts to leak.   Your bladder feels full.  Get help right away if:   You have redness, swelling, or pain where the catheter enters your body.   You have fluid, pus, or a bad smell coming from the area where the catheter enters your body.   The area where the catheter enters your body feels warm.   You have a fever.   You have pain in your:  ? Stomach (abdomen).  ? Legs.  ? Lower back.  ? Bladder.   You see blood fill the catheter.   Your pee is pink or red.   You feel sick to your stomach (nauseous).   You throw up (vomit).   You have chills.   Your catheter gets pulled out.  This information is not intended to replace advice given to you by your health care provider. Make sure you discuss any questions you have with your health care provider.  Document Released: 06/10/2012 Document Revised: 01/12/2016 Document Reviewed: 07/29/2013  [employer]vier Interactive Patient Education  2018 [employer]

## 2017-12-24 NOTE — Anesthesia Postprocedure Evaluation (Signed)
Anesthesia Post Note  Patient: Rick [name]  Procedure(s) Performed: TRANSURETHRAL RESECTION OF THE PROSTATE (TURP) (N/A ) CYSTOSCOPY WITH LITHOLAPAXY (N/A )     Patient location during evaluation: PACU Anesthesia Type: General Level of consciousness: awake Pain management: pain level controlled Vital Signs Assessment: post-procedure vital signs reviewed and stable Respiratory status: spontaneous breathing Cardiovascular status: stable Postop Assessment: no apparent nausea or vomiting Anesthetic complications: no    Last Vitals:  Vitals:   12/18/17 0151 12/18/17 0622  BP: (!) 101/58 (!) 110/57  Pulse: 68 60  Resp: 16 16  Temp: 36.7 C   SpO2: 95% 97%    Last Pain:  Vitals:   12/18/17 0914  TempSrc:   PainSc: 2    Pain Goal: Patients Stated Pain Goal: 0 (12/18/17 0819)               Huston Foley

## 2017-12-24 NOTE — Discharge Summary (Signed)
Physician Discharge Summary  Patient ID: Rick [name] MRN: [id number] DOB/AGE: 1934-01-11 84 y.o.  Admit date: 12/17/2017 Discharge date: 12/18/2017  Admission Diagnoses: BPH Discharge Diagnoses:  Active Problems:   BPH (benign prostatic hyperplasia)   Discharged Condition: good  Hospital Course: The patient tolerated the procedure well and was transferred to the floor on IV pain meds, IV fluid. On POD#1 pt was started on a regulardiet and they ambulated in the halls. His urine was clear. Prior to discharge the pt was tolerating a regular diet, pain was controlled on PO pain meds, they were ambulating without difficulty, and they had normal bowel function.  Consults: None  Significant Diagnostic Studies: none  Treatments: surgery: TURP  Discharge Exam: Blood pressure (!) 110/57, pulse 60, temperature 98.1 F (36.7 C), temperature source Oral, resp. rate 16, height 6\' 2"  (1.88 m), weight 45.8 kg, SpO2 97 %. General appearance: alert, cooperative and appears stated age Head: Normocephalic, without obvious abnormality, atraumatic Nose: Nares normal. Septum midline. Mucosa normal. No drainage or sinus tenderness. Resp: clear to auscultation bilaterally Cardio: regular rate and rhythm, S1, S2 normal, no murmur, click, rub or gallop GI: soft, non-tender; bowel sounds normal; no masses,  no organomegaly Extremities: extremities normal, atraumatic, no cyanosis or edema Neurologic: Grossly normal  Disposition: Discharge disposition: 01-Home or Self Care       Discharge Instructions    Discharge patient   Complete by:  As directed    Discharge disposition:  01-Home or Self Care   Discharge patient date:  12/18/2017     Allergies as of 12/18/2017      Reactions   Aspirin Hives, Itching   Penicillins Hives, Itching, Other (See Comments)   Has patient had a PCN reaction causing immediate rash, facial/tongue/throat swelling, SOB or lightheadedness with hypotension: No Has  patient had a PCN reaction causing severe rash involving mucus membranes or skin necrosis: No Has patient had a PCN reaction that required hospitalization: No Has patient had a PCN reaction occurring within the last 10 years: No If all of the above answers are "NO", then may proceed with Cephalosporin use.      Medication List    STOP taking these medications   clopidogrel 75 MG tablet Commonly known as:  PLAVIX   tamsulosin 0.4 MG Caps capsule Commonly known as:  FLOMAX     TAKE these medications   atorvastatin 20 MG tablet Commonly known as:  LIPITOR Take 1 tablet (20 mg total) by mouth daily.   denosumab 60 MG/ML Soln injection Commonly known as:  PROLIA Inject 60 mg into the skin every 6 (six) months. Administer in upper arm, thigh, or abdomen   glycopyrrolate 1 MG tablet Commonly known as:  ROBINUL TAKE 1 TABLET (1 MG TOTAL) BY MOUTH 3 (THREE) TIMES DAILY. What changed:    how much to take  when to take this   omeprazole 40 MG capsule Commonly known as:  PRILOSEC Take 1 capsule (40 mg total) 2 (two) times daily by mouth.   oxyCODONE-acetaminophen 5-325 MG tablet Commonly known as:  PERCOCET/ROXICET Take 1 tablet by mouth every 4 (four) hours as needed for moderate pain.      Follow-up Information    Karen Kays, NP. Call on 12/21/2017.   Specialty:  Nurse Practitioner Contact information: [address] [place] [address] [phone]           Signed: Nicolette Bang 12/24/2017, 8:34 PM

## 2017-12-31 ENCOUNTER — Ambulatory Visit: Payer: Federal, State, Local not specified - PPO | Admitting: Cardiology

## 2018-01-07 ENCOUNTER — Encounter: Payer: Self-pay | Admitting: Family Medicine

## 2018-01-07 ENCOUNTER — Ambulatory Visit (INDEPENDENT_AMBULATORY_CARE_PROVIDER_SITE_OTHER): Payer: Medicare Other | Admitting: Family Medicine

## 2018-01-07 VITALS — BP 124/57 | HR 74 | Temp 97.7°F | Resp 16 | Ht 74.0 in | Wt 101.4 lb

## 2018-01-07 DIAGNOSIS — Z0000 Encounter for general adult medical examination without abnormal findings: Secondary | ICD-10-CM

## 2018-01-07 DIAGNOSIS — M810 Age-related osteoporosis without current pathological fracture: Secondary | ICD-10-CM

## 2018-01-07 DIAGNOSIS — E785 Hyperlipidemia, unspecified: Secondary | ICD-10-CM

## 2018-01-07 LAB — LIPID PANEL
CHOL/HDL RATIO: 3
Cholesterol: 191 mg/dL (ref 0–200)
HDL: 67.2 mg/dL (ref >39.00)
LDL CALC: 103 mg/dL — AB (ref 0–99)
NONHDL: 123.62
Triglycerides: 104 mg/dL (ref 0.0–149.0)
VLDL: 20.8 mg/dL (ref 0.0–40.0)

## 2018-01-07 LAB — COMPREHENSIVE METABOLIC PANEL
ALT: 8 U/L (ref 0–53)
AST: 13 U/L (ref 0–37)
Albumin: 3.3 g/dL — ABNORMAL LOW (ref 3.5–5.2)
Alkaline Phosphatase: 103 U/L (ref 39–117)
BUN: 15 mg/dL (ref 6–23)
CO2: 34 meq/L — AB (ref 19–32)
Calcium: 9.1 mg/dL (ref 8.4–10.5)
Chloride: 104 mEq/L (ref 96–112)
Creatinine, Ser: 1.01 mg/dL (ref 0.40–1.50)
GFR: 75.03 mL/min (ref >60.00)
GLUCOSE: 192 mg/dL — AB (ref 70–99)
POTASSIUM: 3.8 meq/L (ref 3.5–5.1)
Sodium: 144 mEq/L (ref 135–145)
Total Bilirubin: 0.3 mg/dL (ref 0.2–1.2)
Total Protein: 6.3 g/dL (ref 6.0–8.3)

## 2018-01-07 MED ORDER — DENOSUMAB 60 MG/ML ~~LOC~~ SOSY
60.0000 mg | PREFILLED_SYRINGE | Freq: Once | SUBCUTANEOUS | Status: AC
  Administered 2018-01-07: 60 mg via SUBCUTANEOUS

## 2018-01-07 NOTE — Patient Instructions (Signed)
Preventive Care 82 Years and Older, Male Preventive care refers to lifestyle choices and visits with your health care provider that can promote health and wellness. What does preventive care include?  A yearly physical exam. This is also called an annual well check.  Dental exams once or twice a year.  Routine eye exams. Ask your health care provider how often you should have your eyes checked.  Personal lifestyle choices, including: ? Daily care of your teeth and gums. ? Regular physical activity. ? Eating a healthy diet. ? Avoiding tobacco and drug use. ? Limiting alcohol use. ? Practicing safe sex. ? Taking low doses of aspirin every day. ? Taking vitamin and mineral supplements as recommended by your health care provider. What happens during an annual well check? The services and screenings done by your health care provider during your annual well check will depend on your age, overall health, lifestyle risk factors, and family history of disease. Counseling Your health care provider may ask you questions about your:  Alcohol use.  Tobacco use.  Drug use.  Emotional well-being.  Home and relationship well-being.  Sexual activity.  Eating habits.  History of falls.  Memory and ability to understand (cognition).  Work and work environment.  Screening You may have the following tests or measurements:  Height, weight, and BMI.  Blood pressure.  Lipid and cholesterol levels. These may be checked every 5 years, or more frequently if you are over 50 years old.  Skin check.  Lung cancer screening. You may have this screening every year starting at age 55 if you have a 30-pack-year history of smoking and currently smoke or have quit within the past 15 years.  Fecal occult blood test (FOBT) of the stool. You may have this test every year starting at age 50.  Flexible sigmoidoscopy or colonoscopy. You may have a sigmoidoscopy every 5 years or a colonoscopy every 10  years starting at age 50.  Prostate cancer screening. Recommendations will vary depending on your family history and other risks.  Hepatitis C blood test.  Hepatitis B blood test.  Sexually transmitted disease (STD) testing.  Diabetes screening. This is done by checking your blood sugar (glucose) after you have not eaten for a while (fasting). You may have this done every 1-3 years.  Abdominal aortic aneurysm (AAA) screening. You may need this if you are a current or former smoker.  Osteoporosis. You may be screened starting at age 70 if you are at high risk.  Talk with your health care provider about your test results, treatment options, and if necessary, the need for more tests. Vaccines Your health care provider may recommend certain vaccines, such as:  Influenza vaccine. This is recommended every year.  Tetanus, diphtheria, and acellular pertussis (Tdap, Td) vaccine. You may need a Td booster every 10 years.  Varicella vaccine. You may need this if you have not been vaccinated.  Zoster vaccine. You may need this after age 60.  Measles, mumps, and rubella (MMR) vaccine. You may need at least one dose of MMR if you were born in 1957 or later. You may also need a second dose.  Pneumococcal 13-valent conjugate (PCV13) vaccine. One dose is recommended after age 82.  Pneumococcal polysaccharide (PPSV23) vaccine. One dose is recommended after age 82.  Meningococcal vaccine. You may need this if you have certain conditions.  Hepatitis A vaccine. You may need this if you have certain conditions or if you travel or work in places where you   may be exposed to hepatitis A.  Hepatitis B vaccine. You may need this if you have certain conditions or if you travel or work in places where you may be exposed to hepatitis B.  Haemophilus influenzae type b (Hib) vaccine. You may need this if you have certain risk factors.  Talk to your health care provider about which screenings and vaccines  you need and how often you need them. This information is not intended to replace advice given to you by your health care provider. Make sure you discuss any questions you have with your health care provider. Document Released: 03/12/2015 Document Revised: 11/03/2015 Document Reviewed: 12/15/2014 Else[employer] Interactive Patient Education  2018 [employer]  

## 2018-01-07 NOTE — Progress Notes (Signed)
Subjective:   Rick [name] is a 84 y.o. male who presents for Medicare Annual/Subsequent preventive examination.  Review of Systems:  Review of Systems  Constitutional: Negative for activity change, appetite change and fatigue.  HENT: Negative for hearing loss, congestion, tinnitus and ear discharge.  dentist q4m Eyes: Negative for visual disturbance (see optho q1y -- vision corrected to 20/20 with glasses).  Respiratory: Negative for cough, chest tightness and shortness of breath.   Cardiovascular: Negative for chest pain, palpitations and leg swelling.  Gastrointestinal: Negative for abdominal pain, diarrhea, constipation and abdominal distention.  Genitourinary: Negative for urgency, frequency, decreased urine volume and difficulty urinating.  Musculoskeletal: Negative for back pain, arthralgias and gait problem.  Skin: Negative for color change, pallor and rash.  Neurological: Negative for dizziness, light-headedness, numbness and headaches.  Hematological: Negative for adenopathy. Does not bruise/bleed easily.  Psychiatric/Behavioral: Negative for suicidal ideas, confusion, sleep disturbance, self-injury, dysphoric mood, decreased concentration and agitation.            Objective:    Vitals: BP (!) 124/57 (BP Location: Right Arm, Cuff Size: Normal)   Pulse 74   Temp 97.7 F (36.5 C) (Oral)   Resp 16   Ht 6\' 2"  (1.88 m)   Wt 101 lb 6.4 oz (46 kg)   SpO2 100%   BMI 13.02 kg/m   Body mass index is 13.02 kg/m. BP (!) 124/57 (BP Location: Right Arm, Cuff Size: Normal)   Pulse 74   Temp 97.7 F (36.5 C) (Oral)   Resp 16   Ht 6\' 2"  (1.88 m)   Wt 101 lb 6.4 oz (46 kg)   SpO2 100%   BMI 13.02 kg/m  General appearance: alert, cooperative, appears stated age and no distress Head: Normocephalic, without obvious abnormality, atraumatic Eyes: negative findings: lids and lashes normal and pupils equal, round, reactive to light and accomodation Ears: normal TM's and  external ear canals both ears Nose: Nares normal. Septum midline. Mucosa normal. No drainage or sinus tenderness. Throat: lips, mucosa, and tongue normal; teeth and gums normal Neck: no adenopathy, no carotid bruit, no JVD, supple, symmetrical, trachea midline and thyroid not enlarged, symmetric, no tenderness/mass/nodules Back: symmetric, no curvature. ROM normal. No CVA tenderness. Lungs: clear to auscultation bilaterally Chest wall: no tenderness Heart: regular rate and rhythm, S1, S2 normal, no murmur, click, rub or gallop Abdomen: soft, non-tender; bowel sounds normal; no masses,  no organomegaly Male genitalia: urology Rectal: deferred --urology Extremities: extremities normal, atraumatic, no cyanosis or edema Pulses: 2+ and symmetric Skin: Skin color, texture, turgor normal. No rashes or lesions Lymph nodes: Cervical, supraclavicular, and axillary nodes normal. Neurologic: Alert and oriented X 3, normal strength and tone. Normal symmetric reflexes. Normal coordination and gait Advanced Directives 12/17/2017 12/14/2017 12/11/2017 12/08/2017 10/02/2017 08/02/2017 12/23/2016  Does Patient Have a Medical Advance Directive? Yes Yes No;Yes No No No No  Type of Paramedic of [place];Living will [place];Living will - - - [place];Living will  Does patient want to make changes to medical advance directive? No - Patient declined - No - Patient declined - - - -  Copy of [place] in Chart? No - copy requested - No - copy requested - - - No - copy requested  Would patient like information on creating a medical advance directive? No - Patient declined No - Patient declined No - Patient declined No - Patient declined - No -  Patient declined No - Patient declined    Tobacco Social History   Tobacco Use  Smoking Status Light Tobacco Smoker  . Packs/day: 0.15  . Years: 50.00  . Pack  years: 7.50  . Types: Cigarettes  Smokeless Tobacco Never Used  Tobacco Comment   smokes 5 cigarettes daily max     Ready to quit: Not Answered Counseling given: Not Answered Comment: smokes 5 cigarettes daily max                     Past Medical History:  Diagnosis Date  . Anxiety    about operation  . Aortic atherosclerosis ([place])   . BPH (benign prostatic hyperplasia)   . Carotid artery occlusion   . Carotid artery stenosis    21-39% stenosis of RICA, 19-14% stenosis LICA  . Chronic ear infection    left  . Excessive salivation   . GERD (gastroesophageal reflux disease)   . Hematuria   . History of kidney stones   . Hx of colonic polyps   . Hyperlipidemia   . Hypertension    pt. denies  . Osteoporosis   . Skin cancer   . Urinary retention    Past Surgical History:  Procedure Laterality Date  . COLONOSCOPY    . CYSTOSCOPY WITH LITHOLAPAXY N/A 12/17/2017   Procedure: CYSTOSCOPY WITH LITHOLAPAXY;  Surgeon: Cleon Gustin, MD;  Location: [facility];  Service: Urology;  Laterality: N/A;  . TONSILLECTOMY    . TRANSURETHRAL RESECTION OF PROSTATE     12-17-17 Dr. Alyson Ingles  . TRANSURETHRAL RESECTION OF PROSTATE N/A 12/17/2017   Procedure: TRANSURETHRAL RESECTION OF THE PROSTATE (TURP);  Surgeon: Cleon Gustin, MD;  Location: [facility];  Service: Urology;  Laterality: N/A;   Family History  Problem Relation Age of Onset  . Stroke Mother   . Seizures Father   . Colon cancer Neg Hx    Social History   Socioeconomic History  . Marital status: Married    Spouse name: Not on file  . Number of children: 2  . Years of education: Not on file  . Highest education level: Not on file  Occupational History  . Occupation: retired--[employer]  Social Needs  . Financial resource strain: Not on file  . Food insecurity:    Worry: Not on file    Inability: Not on file  . Transportation needs:    Medical: Not on file    Non-medical: Not on file  Tobacco Use  .  Smoking status: Light Tobacco Smoker    Packs/day: 0.15    Years: 50.00    Pack years: 7.50    Types: Cigarettes  . Smokeless tobacco: Never Used  . Tobacco comment: smokes 5 cigarettes daily max  Substance and Sexual Activity  . Alcohol use: Not Currently    Alcohol/week: 0.0 standard drinks  . Drug use: No  . Sexual activity: Not Currently    Partners: Female  Lifestyle  . Physical activity:    Days per week: Not on file    Minutes per session: Not on file  . Stress: Not on file  Relationships  . Social connections:    Talks on phone: Not on file    Gets together: Not on file    Attends religious service: Not on file    Active member of club or organization: Not on file    Attends meetings of clubs or organizations: Not on file    Relationship status: Not on file  Other Topics Concern  . Not on file  Social History Narrative   Retired- [employer]   Married   Regular exercise- yes    Outpatient Encounter Medications as of 01/07/2018  Medication Sig  . atorvastatin (LIPITOR) 20 MG tablet Take 1 tablet (20 mg total) by mouth daily.  Marland Kitchen denosumab (PROLIA) 60 MG/ML SOLN injection Inject 60 mg into the skin every 6 (six) months. Administer in upper arm, thigh, or abdomen  . glycopyrrolate (ROBINUL) 1 MG tablet TAKE 1 TABLET (1 MG TOTAL) BY MOUTH 3 (THREE) TIMES DAILY. (Patient taking differently: Take 2 mg by mouth 2 (two) times daily. )  . omeprazole (PRILOSEC) 40 MG capsule Take 1 capsule (40 mg total) 2 (two) times daily by mouth.  . oxyCODONE-acetaminophen (PERCOCET/ROXICET) 5-325 MG tablet Take 1 tablet by mouth every 4 (four) hours as needed for moderate pain.  . [EXPIRED] denosumab (PROLIA) injection 60 mg    No facility-administered encounter medications on file as of 01/07/2018.     Activities of Daily Living In your present state of health, do you have any difficulty performing the following activities: 01/09/2018 12/17/2017  Hearing? Y Y  Comment - -  Vision? N N    Difficulty concentrating or making decisions? N N  Walking or climbing stairs? N N  Dressing or bathing? N N  Doing errands, shopping? N N  Some recent data might be hidden    Patient Care Team: Carollee Herter, Alferd Apa, DO as PCP - General Lowella Bandy, MD as Consulting Physician (Urology) Mal Misty, MD (Inactive) as Consulting Physician (Vascular Surgery) Jari Pigg, MD as Consulting Physician (Dermatology) Calvert Cantor, MD as Consulting Physician (Ophthalmology)   Assessment:   This is a routine wellness examination for Kayron.  Exercise Activities and Dietary recommendations Current Exercise Habits: The patient does not participate in regular exercise at present, Exercise limited by: None identified  Goals   None     Fall Risk Fall Risk  01/09/2018 09/26/2017 03/21/2016 03/19/2015 03/06/2014  Falls in the past year? 0 No No No No  Comment - Emmi Telephone Survey: data to providers prior to load - - -  Number falls in past yr: 0 - - - -  Comment - - - - -  Injury with Fall? 0 - - - -  Comment - - - - -  Risk Factor Category  - - - - -  Risk for fall due to : - - - - -  Risk for fall due to: Comment - - - - -   Is the patient's home free of loose throw rugs in walkways, pet beds, electrical cords, etc?   yes        Depression Screen PHQ 2/9 Scores 01/07/2018 03/21/2016 03/19/2015 03/06/2014  PHQ - 2 Score 0 0 0 0  PHQ- 9 Score 2 - - -    Cognitive Function--- pt had no time today he was in a rush         Immunization History  Administered Date(s) Administered  . Influenza Whole 11/28/1998, 12/25/2006, 12/09/2008, 11/14/2010, 11/20/2011, 11/20/2012  . Influenza, High Dose Seasonal PF 12/24/2016, 11/30/2017  . Influenza-Unspecified 01/11/2014, 10/29/2014, 11/28/2015  . Pneumococcal Conjugate-13 03/06/2014  . Pneumococcal Polysaccharide-23 05/22/2002  . Td 12/01/1996, 03/15/2005  . Tdap 05/31/2015  . Zoster 09/13/2005, 03/15/2006, 05/29/2006    Qualifies for  Shingles Vaccine? Yes - at pharmacy  Screening Tests Health Maintenance  Topic Date Due  . DEXA SCAN  10/04/2017  .  TETANUS/TDAP  05/30/2025  . INFLUENZA VACCINE  Completed  . PNA vac Low Risk Adult  Completed   Cancer Screenings: Lung: Low Dose CT Chest recommended if Age 43-80 years, 30 pack-year currently smoking OR have quit w/in 15years. Patient does not qualify. Colorectal: NA  Additional Screenings:  Hepatitis C Screening:      Plan:     I have personally reviewed and noted the following in the patient's chart:   . Medical and social history . Use of alcohol, tobacco or illicit drugs  . Current medications and supplements . Functional ability and status . Nutritional status . Physical activity . Advanced directives . List of other physicians . Hospitalizations, surgeries, and ER visits in previous 12 months . Vitals . Screenings to include cognitive, depression, and falls . Referrals and appointments  In addition, I have reviewed and discussed with patient certain preventive protocols, quality metrics, and best practice recommendations. A written personalized care plan for preventive services as well as general preventive health recommendations were provided to patient.   1. Hyperlipidemia, unspecified hyperlipidemia type Encouraged heart healthy diet, increase exercise, avoid trans fats, consider a krill oil cap daily - Comprehensive metabolic panel - Lipid panel  2. Encounter for Medicare annual wellness exam See above   3. Osteoporosis, unspecified osteoporosis type, unspecified pathological fracture presence  - denosumab (PROLIA) injection 60 mg - DG Bone Density; Future   [place], DO  01/09/2018

## 2018-01-09 ENCOUNTER — Other Ambulatory Visit: Payer: Self-pay | Admitting: Family Medicine

## 2018-01-09 DIAGNOSIS — R7309 Other abnormal glucose: Secondary | ICD-10-CM

## 2018-01-11 ENCOUNTER — Other Ambulatory Visit: Payer: Medicare Other

## 2018-01-14 ENCOUNTER — Other Ambulatory Visit: Payer: Medicare Other

## 2018-02-02 ENCOUNTER — Emergency Department (HOSPITAL_BASED_OUTPATIENT_CLINIC_OR_DEPARTMENT_OTHER): Payer: Medicare Other

## 2018-02-02 ENCOUNTER — Encounter (HOSPITAL_BASED_OUTPATIENT_CLINIC_OR_DEPARTMENT_OTHER): Payer: Self-pay | Admitting: *Deleted

## 2018-02-02 ENCOUNTER — Other Ambulatory Visit: Payer: Self-pay

## 2018-02-02 ENCOUNTER — Emergency Department (HOSPITAL_BASED_OUTPATIENT_CLINIC_OR_DEPARTMENT_OTHER)
Admission: EM | Admit: 2018-02-02 | Discharge: 2018-02-02 | Disposition: A | Discharge status: Home / Self Care | Payer: Medicare Other | Attending: Emergency Medicine | Admitting: Emergency Medicine

## 2018-02-02 DIAGNOSIS — Y929 Unspecified place or not applicable: Secondary | ICD-10-CM | POA: Diagnosis not present

## 2018-02-02 DIAGNOSIS — Y9389 Activity, other specified: Secondary | ICD-10-CM | POA: Diagnosis not present

## 2018-02-02 DIAGNOSIS — Z79899 Other long term (current) drug therapy: Secondary | ICD-10-CM | POA: Diagnosis not present

## 2018-02-02 DIAGNOSIS — I10 Essential (primary) hypertension: Secondary | ICD-10-CM | POA: Insufficient documentation

## 2018-02-02 DIAGNOSIS — Z85828 Personal history of other malignant neoplasm of skin: Secondary | ICD-10-CM | POA: Insufficient documentation

## 2018-02-02 DIAGNOSIS — F17210 Nicotine dependence, cigarettes, uncomplicated: Secondary | ICD-10-CM | POA: Diagnosis not present

## 2018-02-02 DIAGNOSIS — S0990XA Unspecified injury of head, initial encounter: Secondary | ICD-10-CM | POA: Insufficient documentation

## 2018-02-02 DIAGNOSIS — S61411A Laceration without foreign body of right hand, initial encounter: Secondary | ICD-10-CM | POA: Insufficient documentation

## 2018-02-02 DIAGNOSIS — S199XXA Unspecified injury of neck, initial encounter: Secondary | ICD-10-CM | POA: Diagnosis not present

## 2018-02-02 DIAGNOSIS — Y998 Other external cause status: Secondary | ICD-10-CM | POA: Diagnosis not present

## 2018-02-02 DIAGNOSIS — I2510 Atherosclerotic heart disease of native coronary artery without angina pectoris: Secondary | ICD-10-CM | POA: Diagnosis not present

## 2018-02-02 DIAGNOSIS — F419 Anxiety disorder, unspecified: Secondary | ICD-10-CM | POA: Diagnosis not present

## 2018-02-02 DIAGNOSIS — W01198A Fall on same level from slipping, tripping and stumbling with subsequent striking against other object, initial encounter: Secondary | ICD-10-CM | POA: Diagnosis not present

## 2018-02-02 DIAGNOSIS — S6991XA Unspecified injury of right wrist, hand and finger(s), initial encounter: Secondary | ICD-10-CM | POA: Diagnosis present

## 2018-02-02 DIAGNOSIS — W19XXXA Unspecified fall, initial encounter: Secondary | ICD-10-CM

## 2018-02-02 MED ORDER — LIDOCAINE-EPINEPHRINE (PF) 2 %-1:200000 IJ SOLN
10.0000 mL | Freq: Once | INTRAMUSCULAR | Status: AC
  Administered 2018-02-02: 10 mL
  Filled 2018-02-02 (×2): qty 10

## 2018-02-02 NOTE — ED Triage Notes (Signed)
Pt slipped on leaves and fell to the ground. Pt  Hit his right hand and right side of head.

## 2018-02-02 NOTE — ED Notes (Signed)
NAD at this time. Pt is stable and going home.  

## 2018-02-02 NOTE — ED Provider Notes (Signed)
[facility] Provider Note   CSN: [id number] Arrival date & time: 02/02/18  1357   History   Chief Complaint Chief Complaint  Patient presents with  . Fall    HPI Rick [name] is a 84 y.o. male with a hx of GERD, HTN, hyperlipidemia, and carotid artery stenosis & occlusion who presents to the ED with his wife s/p mechanical fall just PTA with complaints of headache and R hand wound. Patient states he was ambulating and slipped on some leaves resulting in a fall forward onto his R hand. He did resultantly hit his head, but had no LOC. He was able to get up with some assistance and has since been ambulatory. States he is having pain w/ abrasion to the head and pain with skin tear wound to the R hand. Current pain is a 5/10 in severity. No alleviating/aggravating factors. Denies change in vision, numbness, weakness, nausea, vomiting, neck pain, or back pain. Last tetanus within 5 years. On plavix.   HPI  Past Medical History:  Diagnosis Date  . Anxiety    about operation  . Aortic atherosclerosis ([place])   . BPH (benign prostatic hyperplasia)   . Carotid artery occlusion   . Carotid artery stenosis    21-39% stenosis of RICA, 26-71% stenosis LICA  . Chronic ear infection    left  . Excessive salivation   . GERD (gastroesophageal reflux disease)   . Hematuria   . History of kidney stones   . Hx of colonic polyps   . Hyperlipidemia   . Hypertension    pt. denies  . Osteoporosis   . Skin cancer   . Urinary retention     Patient Active Problem List   Diagnosis Date Noted  . BPH (benign prostatic hyperplasia) 12/17/2017  . Bifascicular block 10/22/2017  . Medicare annual wellness visit, subsequent 03/22/2016  . Carotid bruit 10/10/2010  . Preventative health care 10/10/2010  . RIB PAIN, RIGHT SIDED 10/11/2009  . UTI 09/28/2009  . TOBACCO USER 09/23/2008  . OSTEOPENIA 05/10/2008  . BACK PAIN, ACUTE 05/08/2008  . KNEE SPRAIN, LEFT, MEDIAL  COLLATERAL LIGAMENT 01/02/2007  . TESTOSTERONE DEFICIENCY 10/02/2006  . Hyperlipidemia 08/01/2006  . ERECTILE DYSFUNCTION 08/01/2006  . BPH with urinary obstruction 08/01/2006  . Essential hypertension 07/27/2006  . Carotid stenosis 07/27/2006  . MICROSCOPIC HEMATURIA 07/27/2006  . Osteoporosis 07/27/2006  . COLONIC POLYPS, HX OF 07/27/2006    Past Surgical History:  Procedure Laterality Date  . COLONOSCOPY    . CYSTOSCOPY WITH LITHOLAPAXY N/A 12/17/2017   Procedure: CYSTOSCOPY WITH LITHOLAPAXY;  Surgeon: Cleon Gustin, MD;  Location: [facility];  Service: Urology;  Laterality: N/A;  . TONSILLECTOMY    . TRANSURETHRAL RESECTION OF PROSTATE     12-17-17 Dr. Alyson Ingles  . TRANSURETHRAL RESECTION OF PROSTATE N/A 12/17/2017   Procedure: TRANSURETHRAL RESECTION OF THE PROSTATE (TURP);  Surgeon: Cleon Gustin, MD;  Location: [facility];  Service: Urology;  Laterality: N/A;        Home Medications    Prior to Admission medications   Medication Sig Start Date End Date Taking? Authorizing Provider  atorvastatin (LIPITOR) 20 MG tablet Take 1 tablet (20 mg total) by mouth daily. 11/19/17   Ann Held, DO  denosumab (PROLIA) 60 MG/ML SOLN injection Inject 60 mg into the skin every 6 (six) months. Administer in upper arm, thigh, or abdomen    [provider]  glycopyrrolate (ROBINUL) 1 MG tablet TAKE 1 TABLET (  1 MG TOTAL) BY MOUTH 3 (THREE) TIMES DAILY. Patient taking differently: Take 2 mg by mouth 2 (two) times daily.  01/26/17   Armbruster, Carlota Raspberry, MD  omeprazole (PRILOSEC) 40 MG capsule Take 1 capsule (40 mg total) 2 (two) times daily by mouth. 01/04/17   Ann Held, DO  oxyCODONE-acetaminophen (PERCOCET/ROXICET) 5-325 MG tablet Take 1 tablet by mouth every 4 (four) hours as needed for moderate pain. 12/18/17   McKenzie, Candee Furbish, MD    Family History Family History  Problem Relation Age of Onset  . Stroke Mother   . Seizures Father   . Colon  cancer Neg Hx     Social History Social History   Tobacco Use  . Smoking status: Light Tobacco Smoker    Packs/day: 0.15    Years: 50.00    Pack years: 7.50    Types: Cigarettes  . Smokeless tobacco: Never Used  . Tobacco comment: smokes 5 cigarettes daily max  Substance Use Topics  . Alcohol use: Not Currently    Alcohol/week: 0.0 standard drinks  . Drug use: No     Allergies   Aspirin and Penicillins   Review of Systems Review of Systems  Constitutional: Negative for chills and fever.  Respiratory: Negative for shortness of breath.   Cardiovascular: Negative for chest pain.  Gastrointestinal: Negative for abdominal pain, nausea and vomiting.  Musculoskeletal: Negative for gait problem and neck pain.       Positive for R hand pain.   Skin: Positive for wound.  Neurological: Positive for headaches. Negative for dizziness, syncope, weakness and numbness.  All other systems reviewed and are negative.    Physical Exam Updated Vital Signs BP 119/67 (BP Location: Right Arm)   Pulse 79   Temp (!) 97.5 F (36.4 C) (Oral)   Resp 18   Ht 6\' 1"  (1.854 m)   Wt 49.9 kg   SpO2 100%   BMI 14.51 kg/m   Physical Exam  Constitutional: He appears well-developed and well-nourished.  Non-toxic appearance. No distress.  HENT:  Head: Head is with abrasion (R forehead, superficial, no active bleeding). Head is without raccoon's eyes and without Battle's sign.  Right Ear: No hemotympanum.  Left Ear: No hemotympanum.  Nose: No rhinorrhea or nasal septal hematoma.  Mouth/Throat: Uvula is midline and oropharynx is clear and moist.  Eyes: Conjunctivae are normal. Right eye exhibits no discharge. Left eye exhibits no discharge.  Neck: Normal range of motion. Neck supple. No spinous process tenderness present.  No palpable step off.   Cardiovascular: Normal rate and regular rhythm.  Pulses:      Radial pulses are 2+ on the right side, and 2+ on the left side.  Pulmonary/Chest:  Effort normal and breath sounds normal. No respiratory distress. He has no wheezes. He has no rhonchi. He has no rales. He exhibits no tenderness.  Respiration even and unlabored  Abdominal: Soft. He exhibits no distension. There is no tenderness.  Musculoskeletal:  Back: no midline tenderness or palpable step off. UEs: Large skin tear to the dorsum of the R hand, approximately 7 cm x 1.5 cm w/ surrounding ecchymosis and soft tissue swelling. Normal AROM to all joints including wrist, MCPs and IP joints. Tender in area of skin tear otherwise nontender. No snuffbox tenderness.  LEs: normal ROM. Nontender.   Neurological: He is alert.  Clear speech. CN III-XII grossly intact. Sensation grossly intact x 4. 5/5 symmetric grip strength and symmetric strength with plantar/dorsiflexion. Ambulatory.  Skin: Skin is warm and dry. Capillary refill takes less than 2 seconds. No rash noted.  Psychiatric: He has a normal mood and affect. His behavior is normal.  Nursing note and vitals reviewed.    ED Treatments / Results  Labs (all labs ordered are listed, but only abnormal results are displayed) Labs Reviewed - No data to display  EKG None  Radiology Ct Head Wo Contrast  Result Date: 02/02/2018 CLINICAL DATA:  Slipped and fell and hit head. EXAM: CT HEAD WITHOUT CONTRAST CT CERVICAL SPINE WITHOUT CONTRAST TECHNIQUE: Multidetector CT imaging of the head and cervical spine was performed following the standard protocol without intravenous contrast. Multiplanar CT image reconstructions of the cervical spine were also generated. COMPARISON:  None. FINDINGS: CT HEAD FINDINGS Brain: Age related cerebral atrophy, ventriculomegaly and periventricular white matter disease. No extra-axial fluid collections are identified. No CT findings for acute hemispheric infarction or intracranial hemorrhage. No mass lesions. The brainstem and cerebellum are normal. Vascular: Vascular calcifications but no hyperdense vessels  or definite aneurysm. Skull: No skull fracture or bone lesions. Sinuses/Orbits: The paranasal sinuses and mastoid air cells are clear except for a small mucous retention cyst or polyp in the right maxillary sinus. The globes are intact. Other: No scalp lesions or hematoma. CT CERVICAL SPINE FINDINGS Alignment: Normal Skull base and vertebrae: Degenerative cervical spondylosis but no acute cervical spine fracture. The facets are normally aligned. No facet fractures. The skull base C1 and C1-2 articulations are maintained. Moderate degenerative changes. No fracture. Soft tissues and spinal canal: No prevertebral fluid or swelling. No visible canal hematoma. Disc levels: The spinal canal is generous. No significant disc protrusions, spinal or foraminal stenosis. Upper chest: Emphysematous changes and dense scarring and calcification. Other: No neck mass or adenopathy. Bilateral carotid artery calcifications are noted. IMPRESSION: 1. Age related cerebral atrophy, ventriculomegaly and periventricular white matter disease. No acute intracranial findings or skull fracture. 2. Degenerative cervical spondylosis but normal alignment and no acute cervical spine fracture. Electronically Signed   By: Marijo Sanes M.D.   On: 02/02/2018 15:49   Ct Cervical Spine Wo Contrast  Result Date: 02/02/2018 CLINICAL DATA:  Slipped and fell and hit head. EXAM: CT HEAD WITHOUT CONTRAST CT CERVICAL SPINE WITHOUT CONTRAST TECHNIQUE: Multidetector CT imaging of the head and cervical spine was performed following the standard protocol without intravenous contrast. Multiplanar CT image reconstructions of the cervical spine were also generated. COMPARISON:  None. FINDINGS: CT HEAD FINDINGS Brain: Age related cerebral atrophy, ventriculomegaly and periventricular white matter disease. No extra-axial fluid collections are identified. No CT findings for acute hemispheric infarction or intracranial hemorrhage. No mass lesions. The brainstem and  cerebellum are normal. Vascular: Vascular calcifications but no hyperdense vessels or definite aneurysm. Skull: No skull fracture or bone lesions. Sinuses/Orbits: The paranasal sinuses and mastoid air cells are clear except for a small mucous retention cyst or polyp in the right maxillary sinus. The globes are intact. Other: No scalp lesions or hematoma. CT CERVICAL SPINE FINDINGS Alignment: Normal Skull base and vertebrae: Degenerative cervical spondylosis but no acute cervical spine fracture. The facets are normally aligned. No facet fractures. The skull base C1 and C1-2 articulations are maintained. Moderate degenerative changes. No fracture. Soft tissues and spinal canal: No prevertebral fluid or swelling. No visible canal hematoma. Disc levels: The spinal canal is generous. No significant disc protrusions, spinal or foraminal stenosis. Upper chest: Emphysematous changes and dense scarring and calcification. Other: No neck mass or adenopathy. Bilateral carotid artery calcifications  are noted. IMPRESSION: 1. Age related cerebral atrophy, ventriculomegaly and periventricular white matter disease. No acute intracranial findings or skull fracture. 2. Degenerative cervical spondylosis but normal alignment and no acute cervical spine fracture. Electronically Signed   By: Marijo Sanes M.D.   On: 02/02/2018 15:49   Dg Hand Complete Right  Result Date: 02/02/2018 CLINICAL DATA:  [place].  Laceration on the back of the hand. EXAM: RIGHT HAND - COMPLETE 3+ VIEW COMPARISON:  Radiographs 02/09/2012 FINDINGS: Remote healed shortened distal radius fracture. No acute fracture is identified. Moderate DIP and PIP joint degenerative changes involving the fingers. Calcification or radiopaque foreign body noted along the dorsal aspect of the hand possibly in the patient's wound. This also could be in a dressing or bandage. IMPRESSION: Remote healed distal radius fracture. No acute fracture. Soft tissue calcifications versus  radiopaque foreign bodies in the patient's wound versus a bandage. Electronically Signed   By: Marijo Sanes M.D.   On: 02/02/2018 15:42    Procedures .Marland KitchenLaceration Repair Date/Time: 02/02/2018 4:53 PM Performed by: Amaryllis Dyke, PA-C Authorized by: Amaryllis Dyke, PA-C   Consent:    Consent obtained:  Verbal   Consent given by:  Patient and spouse   Risks discussed:  Infection, need for additional repair, nerve damage, pain, poor cosmetic result, poor wound healing and retained foreign body   Alternatives discussed:  No treatment Anesthesia (see MAR for exact dosages):    Anesthesia method:  None Laceration details:    Location:  Hand   Hand location:  R hand, dorsum   Length (cm):  7   Depth (mm):  2 Repair type:    Repair type:  Simple Exploration:    Hemostasis achieved with:  Direct pressure   Wound exploration: wound explored through full range of motion and entire depth of wound probed and visualized   Treatment:    Area cleansed with:  Betadine   Amount of cleaning:  Standard   Irrigation solution:  Sterile water   Irrigation method:  Pressure wash   Foreign body removal: Manually removed a few small flecks of leaves/dirt, re-examined, no evidence of remaining FB.   Skin repair:    Repair method:  Steri-Strips   Number of Steri-Strips:  8 Approximation:    Approximation:  Loose Post-procedure details:    Dressing:  Non-adherent dressing and bulky dressing   Patient tolerance of procedure:  Tolerated well, no immediate complications   (including critical care time)  Medications Ordered in ED Medications - No data to display   Initial Impression / Assessment and Plan / ED Course  I have reviewed the triage vital signs and the nursing notes.  Pertinent labs & imaging results that were available during my care of the patient were reviewed by me and considered in my medical decision making (see chart for details).  Clinical Course as of Feb 03 1504  Sat Feb 02, 6481  1255 84 year old male here after a fall in which he injured his right hand and struck his head.  He is on Plavix.  Skin a fairly large skin tear on the dorsum of his right hand.  He is otherwise awake and alert.  He is getting some CAT scan of his head and neck and x-ray of his hand.  Wound care and likely discharge if imaging is negative.   [MB]    Clinical Course User Index [MB] Hayden Rasmussen, MD    Patient presents to the ED s/p mechanical fall,  no prodromal sxs, head injury w/ abrasions on plavis, also with R hand skin tear. Patient nontoxic appearing, in no apparent distress, vitals without significant abnormality. CT head/C spine without acute findings. Spine without palpable tenderness/step off or neuro deficits. R hand xray negative for acute fracture. No snuffbox tenderness to raise concern for scaphoid injury. Extremities otherwise nontender. R hand with skin tear. Pressure irrigation performed, a few small specks of dirt/leaves were removed. Wound again irrigated. Wound explored and base of wound visualized in a bloodless field without evidence of remaining foreign body. Discussed with Dr. Melina Copa who personally evaluated patient- recommended steristirps as opposed to sutures given thin skin and appearance of wound- I am in agreement with this. Repair per note above. NVI distal prior to and following procedure. Tetanus is up to date.  Patient appears safe for discharge home.  PCP follow up. I discussed results, treatment plan, need for follow-up, and return precautions with the patient and his wife. Provided opportunity for questions, patient and his wife confirmed understanding and are in agreement with plan.    Findings and plan of care discussed with supervising physician Dr. Melina Copa who personally evaluated and examined this patient and is in agreement.   Final Clinical Impressions(s) / ED Diagnoses   Final diagnoses:  Fall, initial encounter  Skin tear of  right hand without complication, initial encounter    ED Discharge Orders    None       Amaryllis Dyke, PA-C 02/02/18 1736    Hayden Rasmussen, MD 02/03/18 [phone]

## 2018-02-02 NOTE — Discharge Instructions (Addendum)
You were seen in the ER today for a fall with head injury.  Your CT scan did not show any acute finding in your head or neck.  Your x-ray of your right hand did not show any acute fractures.  Your skin tear to the right hand was repaired with 3 strips.  The strip for follow-up on their own.  For the first 24 hours do not get this area wet, after 24 hours you may get it wet but do not soak it.  Keep this area clean and dry as best possible.  Please have the area rechecked by your primary care provider within 3 to 5 days.  Return to the ER sooner should you have new or worsening symptoms including but not limited to purulent drainage from the wound, redness around the wound, fevers, numbness, weakness, loss of consciousness, chest pain, trouble breathing, or any other concerns that you may have.

## 2018-02-03 ENCOUNTER — Other Ambulatory Visit: Payer: Self-pay

## 2018-02-03 ENCOUNTER — Emergency Department (HOSPITAL_BASED_OUTPATIENT_CLINIC_OR_DEPARTMENT_OTHER)
Admission: EM | Admit: 2018-02-03 | Discharge: 2018-02-03 | Disposition: A | Discharge status: Home / Self Care | Payer: Medicare Other | Attending: Emergency Medicine | Admitting: Emergency Medicine

## 2018-02-03 ENCOUNTER — Encounter (HOSPITAL_BASED_OUTPATIENT_CLINIC_OR_DEPARTMENT_OTHER): Payer: Self-pay | Admitting: Emergency Medicine

## 2018-02-03 DIAGNOSIS — R69 Illness, unspecified: Secondary | ICD-10-CM | POA: Diagnosis not present

## 2018-02-03 DIAGNOSIS — Z5321 Procedure and treatment not carried out due to patient leaving prior to being seen by health care provider: Secondary | ICD-10-CM | POA: Insufficient documentation

## 2018-02-03 NOTE — ED Notes (Signed)
Pt advised staff that he no longer wanted to wait. This RN spoke with patient and advised him of projected wait times. Pt stated he did not need to see a Dr. And just needed his wounds repaired. Pt advised it was necessary to see and EDP to be evaluated for potential injury. Pt offered a warm blanket. Pt was adamant about leaving and choose to go home.

## 2018-02-03 NOTE — ED Triage Notes (Addendum)
Pt was seen yesterday for a fall and reports tripping and falling again today while getting out of the car. He is on blood thinners. He is bleeding from a skin tear to R hand. Skin tear also noted to R elbow. Pt denies LOC.  Reports ongoing head pain from fall yesterday. Did not hit his head today.

## 2018-02-14 ENCOUNTER — Telehealth: Payer: Self-pay

## 2018-02-14 NOTE — Telephone Encounter (Signed)
Thank you :)

## 2018-02-14 NOTE — Telephone Encounter (Signed)
Pt. walked into clinic without an appointment, requesting to have bandages to R arm changed. Pt. stated he has fallen twice this month, sustaining the wounds after initial fall 12/7, at which time he went to ED for bandaging. Author consulted with Dr. Etter Sjogren, who agreed to have nursing assess and re-bandage, but pt. needs f/u appointment d/t  falls. R elbow wound small, closed, with granulation tissue and surrounding erythema present. H202 and isopropyl alcohol applied, dried, and abx ointment with dry non-adherent dressing applied with gauze wrap. R hand wound macerated, with yellow slough, surrounding erythema and swelling present. Pt. stated he has not showered or cleansed wounds since his 12/7 injury. Author cleansed site with H202, isopropyl alcohol, patted dry, and applied abx ointment, non-adherent telfa pad, kerlix, and coban for wrap. Author told pt. That the hand appears infected, and insisted pt. Make an appointment to see Dr. Etter Sjogren. Author assisted pt. in making an appointment for tmr. at 230PM to see PCP, and pt. appreciative. PCP to be made aware.

## 2018-02-15 ENCOUNTER — Ambulatory Visit: Payer: Medicare Other | Admitting: Family Medicine

## 2018-02-15 ENCOUNTER — Encounter: Payer: Self-pay | Admitting: Family Medicine

## 2018-02-15 ENCOUNTER — Ambulatory Visit (INDEPENDENT_AMBULATORY_CARE_PROVIDER_SITE_OTHER): Payer: Medicare Other | Admitting: Family Medicine

## 2018-02-15 VITALS — BP 130/62 | HR 65 | Temp 97.6°F | Resp 16 | Ht 74.0 in | Wt 106.2 lb

## 2018-02-15 DIAGNOSIS — I6523 Occlusion and stenosis of bilateral carotid arteries: Secondary | ICD-10-CM

## 2018-02-15 DIAGNOSIS — T148XXA Other injury of unspecified body region, initial encounter: Secondary | ICD-10-CM | POA: Diagnosis not present

## 2018-02-15 DIAGNOSIS — L089 Local infection of the skin and subcutaneous tissue, unspecified: Secondary | ICD-10-CM | POA: Diagnosis not present

## 2018-02-15 MED ORDER — DOXYCYCLINE HYCLATE 100 MG PO TABS: 100.0000 mg | ORAL_TABLET | Freq: Two times a day (BID) | ORAL | 0 refills | Status: AC

## 2018-02-15 NOTE — Progress Notes (Signed)
Patient ID: Rick [name], male    DOB: May 31, 1933  Age: 84 y.o. MRN: [id number]    Subjective:  Subjective  HPI Rick [name] presents for wound R hand and er visit.  He slipped on wet pavement and fell on his hand.  He was seen in the er and it was dressed -- he came in yesterday to have a nurse look at it and change dressing.  She felt he needed to be seen because it looked infected.    Review of Systems  Constitutional: Negative for appetite change, diaphoresis, fatigue and unexpected weight change.  Eyes: Negative for pain, redness and visual disturbance.  Respiratory: Negative for cough, chest tightness, shortness of breath and wheezing.   Cardiovascular: Negative for chest pain, palpitations and leg swelling.  Endocrine: Negative for cold intolerance, heat intolerance, polydipsia, polyphagia and polyuria.  Genitourinary: Negative for difficulty urinating, dysuria and frequency.  Skin: Positive for wound.  Neurological: Negative for dizziness, light-headedness, numbness and headaches.    History Past Medical History:  Diagnosis Date  . Anxiety    about operation  . Aortic atherosclerosis ([place])   . BPH (benign prostatic hyperplasia)   . Carotid artery occlusion   . Carotid artery stenosis    21-39% stenosis of RICA, 01-60% stenosis LICA  . Chronic ear infection    left  . Excessive salivation   . GERD (gastroesophageal reflux disease)   . Hematuria   . History of kidney stones   . Hx of colonic polyps   . Hyperlipidemia   . Hypertension    pt. denies  . Osteoporosis   . Skin cancer   . Urinary retention     He has a past surgical history that includes Tonsillectomy; Colonoscopy; Transurethral resection of prostate; Transurethral resection of prostate (N/A, 12/17/2017); and Cystoscopy with litholapaxy (N/A, 12/17/2017).   His family history includes Seizures in his father; Stroke in his mother.He reports that he has been smoking cigarettes. He has a 7.50 pack-year  smoking history. He has never used smokeless tobacco. He reports previous alcohol use. He reports that he does not use drugs.  Current Outpatient Medications on File Prior to Visit  Medication Sig Dispense Refill  . atorvastatin (LIPITOR) 20 MG tablet Take 1 tablet (20 mg total) by mouth daily. 90 tablet 1  . denosumab (PROLIA) 60 MG/ML SOLN injection Inject 60 mg into the skin every 6 (six) months. Administer in upper arm, thigh, or abdomen    . glycopyrrolate (ROBINUL) 1 MG tablet TAKE 1 TABLET (1 MG TOTAL) BY MOUTH 3 (THREE) TIMES DAILY. (Patient taking differently: Take 2 mg by mouth 2 (two) times daily. ) 90 tablet 1  . omeprazole (PRILOSEC) 40 MG capsule Take 1 capsule (40 mg total) 2 (two) times daily by mouth. 60 capsule 1  . oxyCODONE-acetaminophen (PERCOCET/ROXICET) 5-325 MG tablet Take 1 tablet by mouth every 4 (four) hours as needed for moderate pain. 30 tablet 0   No current facility-administered medications on file prior to visit.      Objective:  Objective  Physical Exam Vitals signs and nursing note reviewed.  Constitutional:      General: He is sleeping.     Appearance: He is well-developed.  HENT:     Head: Normocephalic and atraumatic.  Eyes:     Pupils: Pupils are equal, round, and reactive to light.  Neck:     Musculoskeletal: Normal range of motion and neck supple.     Thyroid: No thyromegaly.  Cardiovascular:  Rate and Rhythm: Normal rate and regular rhythm.     Heart sounds: No murmur.  Pulmonary:     Effort: Pulmonary effort is normal. No respiratory distress.     Breath sounds: Normal breath sounds. No wheezing or rales.  Chest:     Chest wall: No tenderness.  Musculoskeletal:        General: No tenderness.  Skin:    General: Skin is warm and dry.     Findings: Erythema, lesion and wound present.       Neurological:     Mental Status: He is oriented to person, place, and time.  Psychiatric:        Behavior: Behavior normal.        Thought  Content: Thought content normal.        Judgment: Judgment normal.    BP 130/62 (BP Location: Right Arm, Cuff Size: Normal)   Pulse 65   Temp 97.6 F (36.4 C) (Oral)   Resp 16   Ht 6\' 2"  (1.88 m)   Wt 106 lb 3.2 oz (48.2 kg)   SpO2 94%   BMI 13.64 kg/m  Wt Readings from Last 3 Encounters:  02/15/18 106 lb 3.2 oz (48.2 kg)  02/03/18 109 lb 12.6 oz (49.8 kg)  02/02/18 110 lb (49.9 kg)     Lab Results  Component Value Date   WBC 12.6 (H) 12/18/2017   HGB 8.5 (L) 12/18/2017   HCT 28.0 (L) 12/18/2017   PLT 233 12/18/2017   GLUCOSE 192 (H) 01/07/2018   CHOL 191 01/07/2018   TRIG 104.0 01/07/2018   HDL 67.20 01/07/2018   LDLCALC 103 (H) 01/07/2018   ALT 8 01/07/2018   AST 13 01/07/2018   NA 144 01/07/2018   K 3.8 01/07/2018   CL 104 01/07/2018   CREATININE 1.01 01/07/2018   BUN 15 01/07/2018   CO2 34 (H) 01/07/2018   TSH 0.71 04/14/2010   PSA 5.00 (H) 03/06/2014   HGBA1C 5.6 09/25/2016    No results found.   Assessment & Plan:  Plan  I am having Rick Aquas. [name] start on doxycycline. I am also having him maintain his denosumab, omeprazole, glycopyrrolate, atorvastatin, and oxyCODONE-acetaminophen.  Meds ordered this encounter  Medications  . doxycycline (VIBRA-TABS) 100 MG tablet    Sig: Take 1 tablet (100 mg total) by mouth 2 (two) times daily.    Dispense:  20 tablet    Refill:  0    Problem List Items Addressed This Visit    None    Visit Diagnoses    Wound infection    -  Primary   Relevant Medications   doxycycline (VIBRA-TABS) 100 MG tablet    dressing changed Pt to rto next week for dressing change and wound check  F/u 2 weeks also  Follow-up: Return in about 1 week (around 02/22/2018), or change dressing -- nurse visit.  Ann Held, DO

## 2018-02-15 NOTE — Patient Instructions (Signed)
Wound Infection    A wound infection happens when germs start to grow in the wound. Germs that cause wound infections are most commonly bacteria. Other types of infections can occur as well. In some cases, infection can cause the wound to break open. Wound infections need treatment. If a wound infection is left untreated, complications can occur. This may include an infection in your bloodstream (sepsis) or in a bone (osteomyelitis).  What are the causes?  This condition is caused by germs growing in the wound.  What increases the risk?  The following factors may make you more likely to develop this condition:  · Having a weak body defense system (immune system).  · Having diabetes.  · Taking steroid medicines for a long time (chronic use).  · Smoking.  · Older age.  · Being overweight.  What are the signs or symptoms?  Symptoms of this condition include:  · Having more redness, swelling, or pain at the wound site.  · Having more blood, pus, or fluid at the wound site.  · A bad smell coming from a wound or bandage (dressing).  · Having a fever.  · Feeling tired or fatigued.  How is this diagnosed?  This condition is diagnosed with a medical history and physical exam. You may also have blood tests.  How is this treated?  This condition is treated with an antibiotic medicine. The infection should improve 24-48 hours after you start antibiotics. Any redness around the wound should stop spreading, and the wound should be less painful.  Follow these instructions at home:  Medicines  · Take or apply over-the-counter and prescription medicines only as told by your health care provider.  · If you were prescribed antibiotic medicine, take or apply it as told by your health care provider. Do not stop using the antibiotic even if your condition improves.  Wound care  · Clean the wound each day or as told by your health care provider.  ? Wash the wound with mild soap and water.  ? Rinse the wound with water to remove all  soap.  ? Pat the wound dry with a clean towel. Do not rub it.  · Follow instructions from your health care provider about how to take care of your wound. Make sure you:  ? Wash your hands with soap and water before you change your dressing. If soap and water are not available, use hand sanitizer.  ? Change your dressing as told by your health care provider.  ? Leave stitches (sutures), skin glue, or adhesive strips in place, if this applies. These skin closures may need to stay in place for 2 weeks or longer. If adhesive strip edges start to loosen and curl up, you may trim the loose edges. Do not remove adhesive strips completely unless your health care provider tells you to do that. Some wounds are left open to heal on their own.  · Check your wound every day for signs of infection. Watch for:  ? More redness, swelling, or pain.  ? More fluid or blood.  ? Warmth.  ? Pus or a bad smell.  General instructions  · Keep the dressing dry until your health care provider says it can be removed.  · Do not take baths, swim, use a hot tub, or do anything that would put your wound underwater until your health care provider approves.  · Raise (elevate) the injured area above the level of your heart while you are sitting or   lying down.  · Do not scratch or pick at the wound.  · Keep all follow-up visits as told by your health care provider. This is important.  Contact a health care provider if:  · Your pain is not controlled with medicine.  · You have more redness, swelling, or pain around your wound.  · You have more fluid or blood coming from your wound.  · Your wound feels warm to the touch.  · You have pus coming from your wound.  · You continue to notice a bad smell coming from your wound or your dressing.  · Your wound that was closed breaks open.  Get help right away if:  · You have a red streak going away from your wound.  · You have a fever.  This information is not intended to replace advice given to you by your  health care provider. Make sure you discuss any questions you have with your health care provider.  Document Released: 11/12/2002 Document Revised: 07/28/2015 Document Reviewed: 08/03/2014  Elsevier Interactive Patient Education © 2019 [employer]

## 2018-02-22 ENCOUNTER — Ambulatory Visit (INDEPENDENT_AMBULATORY_CARE_PROVIDER_SITE_OTHER): Payer: Medicare Other

## 2018-02-22 VITALS — Temp 97.9°F

## 2018-02-22 DIAGNOSIS — L089 Local infection of the skin and subcutaneous tissue, unspecified: Secondary | ICD-10-CM

## 2018-02-22 DIAGNOSIS — T148XXA Other injury of unspecified body region, initial encounter: Principal | ICD-10-CM

## 2018-02-22 NOTE — Progress Notes (Addendum)
Pt. here for weekly wound check and dressing change per Dr. Etter Sjogren order. Pt. States he is still taking doxycycline.  Wound spans 5cmX4.5cmX 0cm, most of which is glossy red or brown crust, with some of the wound bed expressing moderate amount of yellow-brown drainage. Site cleansed with H202 and isopropyl alcohol, air dried, and antibacterial ointment applied to site with non-adherent telfa, secured with kling and coban. Temperature 97.9, pt. Denies any fevers at home. When asked if girlfriend or anyone can help pt. at home change dressings, pt. Stated, "no". When suggested he change it as frequently as daily, pt. Stated "I don't think I could change it everyday". Author to route to Dr. Etter Sjogren to advise.   02/25/18: Dr. Etter Sjogren out of the office until 1/2, not checking IB. Author spoke with Dr. Etter Sjogren over the phone and Dr. Etter Sjogren advised Pryor Curia to consult with covering provider instead. Visit information routed to [facility], [place].

## 2018-02-25 ENCOUNTER — Telehealth: Payer: Self-pay

## 2018-02-25 ENCOUNTER — Telehealth: Payer: Self-pay | Admitting: Medical

## 2018-02-25 NOTE — Telephone Encounter (Signed)
I saw picture. Looks like full tomorrow morning and this afternoon.  Can you call pt and get him in for nurse visit tomorrow. But advise nurse to get me as want to check wound. Will likely need wound culture. May need to add second antibiotic. May need wound care evaluation.

## 2018-02-25 NOTE — Progress Notes (Signed)
Suggest wound clinic referral ?

## 2018-02-25 NOTE — Telephone Encounter (Signed)
Per conversation with Mackie Pai, PA, best for pt. to return to office this week for wound re-check and culture if needed. Author phoned pt. to set up appointment, but no answer, unable to leave VM as box full. Routed to [place], [place] and [employer], to attempt to reach pt. again to either schedule another NV with DOD visualizing site or schedule with Dr. Etter Sjogren this week.

## 2018-02-25 NOTE — Telephone Encounter (Signed)
Please call pt tomorrow

## 2018-02-25 NOTE — Telephone Encounter (Signed)
Will you see my note. If we can get nurse visit tomorrow for wound check. My schedule is  full tomorrow. If can't get in tomorrow then can schedule with me on Thursday.

## 2018-02-25 NOTE — Telephone Encounter (Signed)
Called pt MB full and there is no other MB to leave pt a message.

## 2018-02-26 ENCOUNTER — Ambulatory Visit: Payer: Medicare Other

## 2018-02-26 NOTE — Telephone Encounter (Signed)
Tried calling patient this morning and no answer/vm was full.  Called back around 1105 and wife stated that they had phone problems.  Asked if they could make it by 1130 and patient declined.  Asked how hand was doing and he stated that it was better.  We made an appointment for 02/28/18 at 3 pm to follow up.

## 2018-02-28 ENCOUNTER — Ambulatory Visit (INDEPENDENT_AMBULATORY_CARE_PROVIDER_SITE_OTHER): Payer: Medicare Other | Admitting: Family Medicine

## 2018-02-28 ENCOUNTER — Encounter: Payer: Self-pay | Admitting: Family Medicine

## 2018-02-28 VITALS — BP 128/68 | HR 65 | Temp 97.4°F | Resp 16 | Ht 74.0 in | Wt 101.0 lb

## 2018-02-28 DIAGNOSIS — S61411D Laceration without foreign body of right hand, subsequent encounter: Secondary | ICD-10-CM | POA: Diagnosis not present

## 2018-02-28 NOTE — Telephone Encounter (Signed)
Pt being seen today by PCP.

## 2018-02-28 NOTE — Patient Instructions (Signed)
rto in 2-3 week for nurse visit

## 2018-02-28 NOTE — Progress Notes (Signed)
Patient ID: Rick [name], male    DOB: 09-13-1933  Age: 85 y.o. MRN: [id number]    Subjective:  Subjective  HPI Rick [name] presents for f/u laceration hand.   It is healing well.     Review of Systems  Constitutional: Negative for chills and fever.  HENT: Negative for congestion and hearing loss.   Eyes: Negative for discharge.  Respiratory: Negative for cough and shortness of breath.   Cardiovascular: Negative for chest pain, palpitations and leg swelling.  Gastrointestinal: Negative for abdominal pain, blood in stool, constipation, diarrhea, nausea and vomiting.  Genitourinary: Negative for dysuria, frequency, hematuria and urgency.  Musculoskeletal: Negative for back pain and myalgias.  Skin: Positive for wound. Negative for rash.  Allergic/Immunologic: Negative for environmental allergies.  Neurological: Negative for dizziness, weakness and headaches.  Hematological: Does not bruise/bleed easily.  Psychiatric/Behavioral: Negative for suicidal ideas. The patient is not nervous/anxious.     History Past Medical History:  Diagnosis Date  . Anxiety    about operation  . Aortic atherosclerosis ([place])   . BPH (benign prostatic hyperplasia)   . Carotid artery occlusion   . Carotid artery stenosis    21-39% stenosis of RICA, 40-98% stenosis LICA  . Chronic ear infection    left  . Excessive salivation   . GERD (gastroesophageal reflux disease)   . Hematuria   . History of kidney stones   . Hx of colonic polyps   . Hyperlipidemia   . Hypertension    pt. denies  . Osteoporosis   . Skin cancer   . Urinary retention     He has a past surgical history that includes Tonsillectomy; Colonoscopy; Transurethral resection of prostate; Transurethral resection of prostate (N/A, 12/17/2017); and Cystoscopy with litholapaxy (N/A, 12/17/2017).   His family history includes Seizures in his father; Stroke in his mother.He reports that he has been smoking cigarettes. He has a 7.50  pack-year smoking history. He has never used smokeless tobacco. He reports previous alcohol use. He reports that he does not use drugs.  Current Outpatient Medications on File Prior to Visit  Medication Sig Dispense Refill  . atorvastatin (LIPITOR) 20 MG tablet Take 1 tablet (20 mg total) by mouth daily. 90 tablet 1  . denosumab (PROLIA) 60 MG/ML SOLN injection Inject 60 mg into the skin every 6 (six) months. Administer in upper arm, thigh, or abdomen    . doxycycline (VIBRA-TABS) 100 MG tablet Take 1 tablet (100 mg total) by mouth 2 (two) times daily. 20 tablet 0  . glycopyrrolate (ROBINUL) 1 MG tablet TAKE 1 TABLET (1 MG TOTAL) BY MOUTH 3 (THREE) TIMES DAILY. (Patient taking differently: Take 2 mg by mouth 2 (two) times daily. ) 90 tablet 1  . omeprazole (PRILOSEC) 40 MG capsule Take 1 capsule (40 mg total) 2 (two) times daily by mouth. 60 capsule 1  . oxyCODONE-acetaminophen (PERCOCET/ROXICET) 5-325 MG tablet Take 1 tablet by mouth every 4 (four) hours as needed for moderate pain. 30 tablet 0   No current facility-administered medications on file prior to visit.      Objective:  Objective  Physical Exam Vitals signs and nursing note reviewed.  Constitutional:      General: He is sleeping.     Appearance: He is well-developed.  HENT:     Head: Normocephalic and atraumatic.  Eyes:     Pupils: Pupils are equal, round, and reactive to light.  Neck:     Musculoskeletal: Normal range of motion and neck  supple.     Thyroid: No thyromegaly.  Cardiovascular:     Rate and Rhythm: Normal rate and regular rhythm.     Heart sounds: No murmur.  Pulmonary:     Effort: Pulmonary effort is normal. No respiratory distress.     Breath sounds: Normal breath sounds. No wheezing or rales.  Chest:     Chest wall: No tenderness.  Musculoskeletal:        General: No tenderness.  Skin:    General: Skin is warm and dry.  Neurological:     Mental Status: He is oriented to person, place, and time.    Psychiatric:        Behavior: Behavior normal.        Thought Content: Thought content normal.        Judgment: Judgment normal.      BP 128/68 (BP Location: Right Arm, Cuff Size: Normal)   Pulse 65   Temp (!) 97.4 F (36.3 C) (Oral)   Resp 16   Ht 6\' 2"  (1.88 m)   Wt 101 lb (45.8 kg)   SpO2 99%   BMI 12.97 kg/m  Wt Readings from Last 3 Encounters:  02/28/18 101 lb (45.8 kg)  02/15/18 106 lb 3.2 oz (48.2 kg)  02/03/18 109 lb 12.6 oz (49.8 kg)     Lab Results  Component Value Date   WBC 12.6 (H) 12/18/2017   HGB 8.5 (L) 12/18/2017   HCT 28.0 (L) 12/18/2017   PLT 233 12/18/2017   GLUCOSE 192 (H) 01/07/2018   CHOL 191 01/07/2018   TRIG 104.0 01/07/2018   HDL 67.20 01/07/2018   LDLCALC 103 (H) 01/07/2018   ALT 8 01/07/2018   AST 13 01/07/2018   NA 144 01/07/2018   K 3.8 01/07/2018   CL 104 01/07/2018   CREATININE 1.01 01/07/2018   BUN 15 01/07/2018   CO2 34 (H) 01/07/2018   TSH 0.71 04/14/2010   PSA 5.00 (H) 03/06/2014   HGBA1C 5.6 09/25/2016    No results found.   Assessment & Plan:  Plan  I am having Rick [name] maintain his denosumab, omeprazole, glycopyrrolate, atorvastatin, oxyCODONE-acetaminophen, and doxycycline.  No orders of the defined types were placed in this encounter.   Problem List Items Addressed This Visit    None    Visit Diagnoses    Laceration of right hand, foreign body presence unspecified, subsequent encounter    -  Primary    keep clean Healing well rto sooner prn  Follow-up: Return in about 3 weeks (around 03/21/2018).  Ann Held, DO

## 2018-03-11 NOTE — Progress Notes (Signed)
Noted. Agree with above.  [place], DO 03/11/18 4:47 PM

## 2018-03-21 ENCOUNTER — Ambulatory Visit: Payer: Medicare Other

## 2018-03-25 DIAGNOSIS — R351 Nocturia: Secondary | ICD-10-CM | POA: Diagnosis not present

## 2018-03-25 DIAGNOSIS — N401 Enlarged prostate with lower urinary tract symptoms: Secondary | ICD-10-CM | POA: Diagnosis not present

## 2018-07-03 ENCOUNTER — Other Ambulatory Visit: Payer: Self-pay | Admitting: *Deleted

## 2018-07-03 ENCOUNTER — Telehealth: Payer: Self-pay | Admitting: *Deleted

## 2018-07-03 MED ORDER — CLOPIDOGREL BISULFATE 75 MG PO TABS: 75.0000 mg | ORAL_TABLET | Freq: Every day | ORAL | 0 refills | Status: DC

## 2018-07-03 MED ORDER — ATORVASTATIN CALCIUM 20 MG PO TABS: 20.0000 mg | ORAL_TABLET | Freq: Every day | ORAL | 1 refills | Status: AC

## 2018-07-03 NOTE — Telephone Encounter (Signed)
Do you want him back on the clopidogrel?  It is not on med list.  It looks like the ER had d/c this medicine in October.

## 2018-07-03 NOTE — Telephone Encounter (Signed)
He sees a vascular surgeon who wanted him on it but he was also supposed to f/u with him in March--- refill it but he needs to f/u with him even if its a telephone visit

## 2018-07-03 NOTE — Telephone Encounter (Signed)
rx sent in 

## 2018-08-21 ENCOUNTER — Other Ambulatory Visit: Payer: Self-pay

## 2018-08-21 ENCOUNTER — Ambulatory Visit (INDEPENDENT_AMBULATORY_CARE_PROVIDER_SITE_OTHER): Payer: Medicare Other

## 2018-08-21 ENCOUNTER — Other Ambulatory Visit (INDEPENDENT_AMBULATORY_CARE_PROVIDER_SITE_OTHER): Payer: Medicare Other

## 2018-08-21 DIAGNOSIS — M810 Age-related osteoporosis without current pathological fracture: Secondary | ICD-10-CM | POA: Diagnosis not present

## 2018-08-21 LAB — COMPREHENSIVE METABOLIC PANEL
ALT: 9 U/L (ref 0–53)
AST: 18 U/L (ref 0–37)
Albumin: 3.6 g/dL (ref 3.5–5.2)
Alkaline Phosphatase: 81 U/L (ref 39–117)
BUN: 18 mg/dL (ref 6–23)
CO2: 35 mEq/L — ABNORMAL HIGH (ref 19–32)
Calcium: 9.4 mg/dL (ref 8.4–10.5)
Chloride: 104 mEq/L (ref 96–112)
Creatinine, Ser: 0.82 mg/dL (ref 0.40–1.50)
GFR: 89.65 mL/min (ref >60.00)
Glucose, Bld: 87 mg/dL (ref 70–99)
Potassium: 3.9 mEq/L (ref 3.5–5.1)
Sodium: 145 mEq/L (ref 135–145)
Total Bilirubin: 0.4 mg/dL (ref 0.2–1.2)
Total Protein: 6.8 g/dL (ref 6.0–8.3)

## 2018-08-21 MED ORDER — DENOSUMAB 60 MG/ML ~~LOC~~ SOSY
60.0000 mg | PREFILLED_SYRINGE | Freq: Once | SUBCUTANEOUS | Status: AC
  Administered 2018-08-21: 60 mg via SUBCUTANEOUS

## 2018-08-26 NOTE — Progress Notes (Signed)
Makenzee Choudhry R Lowne Chase, DO 

## 2018-09-25 ENCOUNTER — Other Ambulatory Visit: Payer: Self-pay | Admitting: Family Medicine

## 2018-10-27 ENCOUNTER — Encounter: Payer: Self-pay | Admitting: Family Medicine

## 2018-10-29 ENCOUNTER — Telehealth: Payer: Self-pay

## 2018-10-29 NOTE — Telephone Encounter (Signed)
Copied from [place] [id number]. Topic: General - Deceased Patient >> Nov 12, 2018  9:37 AM Berneta Levins wrote: Reason for CRM:   Ovid Curd with [employer] calling.  Pt passed away in home on Nov 10, 2018.   [employer] will be dropping off death certificate.  Route to department's PEC Pool.

## 2018-10-29 NOTE — Progress Notes (Signed)
Received call from [facility] at 7:50pm on 11-05-2018 that patient had passed away.  Darren from EMS reports that patients wife is a poor historian and couldn't provide a whole lot of information regarding his death.   States that she had stepped out of the room for a little while and when she returned he was unresponsive and she contacted 911. Denies recent acute illness.  Per EMT he had several full medication bottles that were filled in July that appear to have never been taken.

## 2018-10-29 DEATH — deceased

## 2018-10-30 NOTE — Telephone Encounter (Signed)
[employer] [employer] dropped off document to be filled out by provider. Document given to [facility]. Please call [place] at [phone] when ready to pick up.

## 2018-11-05 NOTE — Telephone Encounter (Signed)
Ovid Curd with [employer] called in stating they are needing the death certificate faxed over to [phone]. Please advise.

## 2018-11-06 NOTE — Telephone Encounter (Signed)
Transfer from [facility] - spoke with Ovid Curd - I have pulled and faxed a copy to him so they may begin cremation. Original in the file at front office for pick up this afternoon.
